# Patient Record
Sex: Female | Born: 1990 | Race: White | Hispanic: No | Marital: Single | Smoking: Former smoker
Health system: Southern US, Community
[De-identification: ages and names within clinical notes are randomized; demographics above are authoritative.]

## PROBLEM LIST (undated history)

## (undated) ENCOUNTER — Inpatient Hospital Stay (HOSPITAL_COMMUNITY): Payer: Self-pay

## (undated) DIAGNOSIS — F32A Depression, unspecified: Secondary | ICD-10-CM

## (undated) DIAGNOSIS — N1330 Unspecified hydronephrosis: Secondary | ICD-10-CM

## (undated) DIAGNOSIS — R12 Heartburn: Secondary | ICD-10-CM

## (undated) DIAGNOSIS — F419 Anxiety disorder, unspecified: Secondary | ICD-10-CM

## (undated) DIAGNOSIS — F4310 Post-traumatic stress disorder, unspecified: Secondary | ICD-10-CM

## (undated) DIAGNOSIS — F329 Major depressive disorder, single episode, unspecified: Secondary | ICD-10-CM

## (undated) DIAGNOSIS — Z9289 Personal history of other medical treatment: Secondary | ICD-10-CM

## (undated) DIAGNOSIS — B999 Unspecified infectious disease: Secondary | ICD-10-CM

## (undated) DIAGNOSIS — F0781 Postconcussional syndrome: Secondary | ICD-10-CM

## (undated) DIAGNOSIS — M1990 Unspecified osteoarthritis, unspecified site: Secondary | ICD-10-CM

## (undated) DIAGNOSIS — F429 Obsessive-compulsive disorder, unspecified: Secondary | ICD-10-CM

## (undated) DIAGNOSIS — M797 Fibromyalgia: Secondary | ICD-10-CM

## (undated) DIAGNOSIS — M2550 Pain in unspecified joint: Secondary | ICD-10-CM

## (undated) DIAGNOSIS — Z9889 Other specified postprocedural states: Secondary | ICD-10-CM

## (undated) DIAGNOSIS — R000 Tachycardia, unspecified: Secondary | ICD-10-CM

## (undated) DIAGNOSIS — D649 Anemia, unspecified: Secondary | ICD-10-CM

## (undated) DIAGNOSIS — R112 Nausea with vomiting, unspecified: Secondary | ICD-10-CM

## (undated) DIAGNOSIS — G8929 Other chronic pain: Secondary | ICD-10-CM

## (undated) DIAGNOSIS — S82899A Other fracture of unspecified lower leg, initial encounter for closed fracture: Secondary | ICD-10-CM

## (undated) DIAGNOSIS — R51 Headache: Secondary | ICD-10-CM

## (undated) HISTORY — PX: TUBAL LIGATION: SHX77

## (undated) HISTORY — PX: OTHER SURGICAL HISTORY: SHX169

## (undated) HISTORY — DX: Postconcussional syndrome: F07.81

## (undated) HISTORY — DX: Fibromyalgia: M79.7

## (undated) HISTORY — DX: Pain in unspecified joint: M25.50

## (undated) HISTORY — DX: Tachycardia, unspecified: R00.0

---
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
## 2009-04-23 ENCOUNTER — Emergency Department (HOSPITAL_COMMUNITY): Admission: EM | Admit: 2009-04-23 | Discharge: 2009-04-23 | Payer: Self-pay | Admitting: Family Medicine

## 2009-06-19 ENCOUNTER — Emergency Department (HOSPITAL_BASED_OUTPATIENT_CLINIC_OR_DEPARTMENT_OTHER): Admission: EM | Admit: 2009-06-19 | Discharge: 2009-06-20 | Payer: Self-pay | Admitting: Emergency Medicine

## 2009-06-20 ENCOUNTER — Ambulatory Visit: Payer: Self-pay | Admitting: Diagnostic Radiology

## 2009-09-02 ENCOUNTER — Encounter: Admission: RE | Admit: 2009-09-02 | Discharge: 2009-09-02 | Payer: Self-pay | Admitting: Family Medicine

## 2009-10-31 ENCOUNTER — Ambulatory Visit: Payer: Self-pay | Admitting: Diagnostic Radiology

## 2009-10-31 ENCOUNTER — Emergency Department (HOSPITAL_BASED_OUTPATIENT_CLINIC_OR_DEPARTMENT_OTHER): Admission: EM | Admit: 2009-10-31 | Discharge: 2009-10-31 | Payer: Self-pay | Admitting: Emergency Medicine

## 2010-03-11 ENCOUNTER — Emergency Department (HOSPITAL_BASED_OUTPATIENT_CLINIC_OR_DEPARTMENT_OTHER): Admission: EM | Admit: 2010-03-11 | Discharge: 2010-03-12 | Payer: Self-pay | Admitting: Emergency Medicine

## 2010-03-11 ENCOUNTER — Ambulatory Visit: Payer: Self-pay | Admitting: Diagnostic Radiology

## 2010-03-12 ENCOUNTER — Ambulatory Visit: Payer: Self-pay | Admitting: Diagnostic Radiology

## 2010-05-08 ENCOUNTER — Encounter: Payer: Self-pay | Admitting: Family Medicine

## 2010-06-16 ENCOUNTER — Emergency Department (HOSPITAL_BASED_OUTPATIENT_CLINIC_OR_DEPARTMENT_OTHER)
Admission: EM | Admit: 2010-06-16 | Discharge: 2010-06-17 | Disposition: A | Discharge status: Home / Self Care | Payer: Medicaid Other | Attending: Emergency Medicine | Admitting: Emergency Medicine

## 2010-06-16 DIAGNOSIS — J329 Chronic sinusitis, unspecified: Secondary | ICD-10-CM | POA: Insufficient documentation

## 2010-06-16 DIAGNOSIS — F319 Bipolar disorder, unspecified: Secondary | ICD-10-CM | POA: Insufficient documentation

## 2010-06-16 DIAGNOSIS — F17200 Nicotine dependence, unspecified, uncomplicated: Secondary | ICD-10-CM | POA: Insufficient documentation

## 2010-06-28 LAB — COMPREHENSIVE METABOLIC PANEL
AST: 13 U/L (ref 0–37)
Alkaline Phosphatase: 126 U/L — ABNORMAL HIGH (ref 39–117)
BUN: 15 mg/dL (ref 6–23)
Calcium: 9.3 mg/dL (ref 8.4–10.5)
Glucose, Bld: 94 mg/dL (ref 70–99)
Total Protein: 7.2 g/dL (ref 6.0–8.3)

## 2010-06-28 LAB — CBC
HCT: 34.3 % — ABNORMAL LOW (ref 36.0–46.0)
Hemoglobin: 11.6 g/dL — ABNORMAL LOW (ref 12.0–15.0)
MCH: 25.3 pg — ABNORMAL LOW (ref 26.0–34.0)
MCHC: 34 g/dL (ref 30.0–36.0)
MCV: 74.3 fL — ABNORMAL LOW (ref 78.0–100.0)

## 2010-06-28 LAB — DIFFERENTIAL
Basophils Absolute: 0.1 10*3/uL (ref 0.0–0.1)
Basophils Relative: 1 % (ref 0–1)
Eosinophils Absolute: 0.5 10*3/uL (ref 0.0–0.7)
Monocytes Absolute: 0.7 10*3/uL (ref 0.1–1.0)

## 2010-06-28 LAB — POCT CARDIAC MARKERS
CKMB, poc: <1 ng/mL — ABNORMAL LOW (ref 1.0–8.0)
Myoglobin, poc: 25 ng/mL (ref 12–200)

## 2010-06-28 LAB — D-DIMER, QUANTITATIVE: D-Dimer, Quant: 0.98 ug/mL-FEU — ABNORMAL HIGH (ref 0.00–0.48)

## 2010-07-11 LAB — COMPREHENSIVE METABOLIC PANEL
AST: 18 U/L (ref 0–37)
Alkaline Phosphatase: 145 U/L — ABNORMAL HIGH (ref 39–117)
BUN: 12 mg/dL (ref 6–23)
Calcium: 9.5 mg/dL (ref 8.4–10.5)
GFR calc non Af Amer: >60 mL/min (ref >60)
Sodium: 144 mEq/L (ref 135–145)
Total Bilirubin: 0.3 mg/dL (ref 0.3–1.2)
Total Protein: 7.7 g/dL (ref 6.0–8.3)

## 2010-07-11 LAB — DIFFERENTIAL
Eosinophils Relative: 4 % (ref 0–5)
Lymphocytes Relative: 23 % (ref 12–46)
Monocytes Absolute: 0.5 10*3/uL (ref 0.1–1.0)
Monocytes Relative: 3 % (ref 3–12)
Neutrophils Relative %: 70 % (ref 43–77)

## 2010-07-11 LAB — WET PREP, GENITAL: Yeast Wet Prep HPF POC: NONE SEEN

## 2010-07-11 LAB — URINE CULTURE: Colony Count: NO GROWTH

## 2010-07-11 LAB — CBC
HCT: 37.6 % (ref 36.0–46.0)
Hemoglobin: 12.6 g/dL (ref 12.0–15.0)
RBC: 5.01 MIL/uL (ref 3.87–5.11)

## 2010-07-11 LAB — URINALYSIS, ROUTINE W REFLEX MICROSCOPIC
Bilirubin Urine: NEGATIVE
Glucose, UA: NEGATIVE mg/dL
Hgb urine dipstick: NEGATIVE
Specific Gravity, Urine: 1.023 (ref 1.005–1.030)
pH: 6 (ref 5.0–8.0)

## 2010-07-14 ENCOUNTER — Emergency Department (INDEPENDENT_AMBULATORY_CARE_PROVIDER_SITE_OTHER): Payer: Medicaid Other

## 2010-07-14 ENCOUNTER — Emergency Department (HOSPITAL_BASED_OUTPATIENT_CLINIC_OR_DEPARTMENT_OTHER)
Admission: EM | Admit: 2010-07-14 | Discharge: 2010-07-14 | Disposition: A | Discharge status: Home / Self Care | Payer: Medicaid Other | Attending: Emergency Medicine | Admitting: Emergency Medicine

## 2010-07-14 DIAGNOSIS — F17200 Nicotine dependence, unspecified, uncomplicated: Secondary | ICD-10-CM

## 2010-07-14 DIAGNOSIS — F411 Generalized anxiety disorder: Secondary | ICD-10-CM | POA: Insufficient documentation

## 2010-07-14 DIAGNOSIS — R079 Chest pain, unspecified: Secondary | ICD-10-CM

## 2010-07-14 DIAGNOSIS — R209 Unspecified disturbances of skin sensation: Secondary | ICD-10-CM

## 2010-07-14 DIAGNOSIS — F319 Bipolar disorder, unspecified: Secondary | ICD-10-CM | POA: Insufficient documentation

## 2010-07-14 DIAGNOSIS — R0602 Shortness of breath: Secondary | ICD-10-CM | POA: Insufficient documentation

## 2010-11-10 ENCOUNTER — Emergency Department (HOSPITAL_BASED_OUTPATIENT_CLINIC_OR_DEPARTMENT_OTHER)
Admission: EM | Admit: 2010-11-10 | Discharge: 2010-11-10 | Disposition: A | Discharge status: Home / Self Care | Payer: Medicaid Other | Attending: Emergency Medicine | Admitting: Emergency Medicine

## 2010-11-10 ENCOUNTER — Emergency Department (INDEPENDENT_AMBULATORY_CARE_PROVIDER_SITE_OTHER): Payer: Medicaid Other

## 2010-11-10 ENCOUNTER — Encounter: Payer: Self-pay | Admitting: *Deleted

## 2010-11-10 DIAGNOSIS — F411 Generalized anxiety disorder: Secondary | ICD-10-CM | POA: Insufficient documentation

## 2010-11-10 DIAGNOSIS — F319 Bipolar disorder, unspecified: Secondary | ICD-10-CM | POA: Insufficient documentation

## 2010-11-10 DIAGNOSIS — F17200 Nicotine dependence, unspecified, uncomplicated: Secondary | ICD-10-CM | POA: Insufficient documentation

## 2010-11-10 DIAGNOSIS — Y92009 Unspecified place in unspecified non-institutional (private) residence as the place of occurrence of the external cause: Secondary | ICD-10-CM | POA: Insufficient documentation

## 2010-11-10 DIAGNOSIS — S93409A Sprain of unspecified ligament of unspecified ankle, initial encounter: Secondary | ICD-10-CM

## 2010-11-10 DIAGNOSIS — M25579 Pain in unspecified ankle and joints of unspecified foot: Secondary | ICD-10-CM

## 2010-11-10 DIAGNOSIS — X500XXA Overexertion from strenuous movement or load, initial encounter: Secondary | ICD-10-CM | POA: Insufficient documentation

## 2010-11-10 HISTORY — DX: Anxiety disorder, unspecified: F41.9

## 2010-11-10 MED ORDER — OXYCODONE-ACETAMINOPHEN 5-325 MG PO TABS
1.0000 | ORAL_TABLET | Freq: Once | ORAL | Status: AC
  Administered 2010-11-10: 1 via ORAL
  Filled 2010-11-10: qty 1

## 2010-11-10 MED ORDER — OXYCODONE-ACETAMINOPHEN 5-325 MG PO TABS: 2.0000 | ORAL_TABLET | ORAL | Status: AC | PRN

## 2010-11-10 MED ORDER — IBUPROFEN 600 MG PO TABS: 600.0000 mg | ORAL_TABLET | Freq: Four times a day (QID) | ORAL | Status: AC | PRN

## 2010-11-10 MED ORDER — IBUPROFEN 400 MG PO TABS
600.0000 mg | ORAL_TABLET | Freq: Once | ORAL | Status: AC
  Administered 2010-11-10: 600 mg via ORAL
  Filled 2010-11-10: qty 1

## 2010-11-10 NOTE — ED Notes (Signed)
Pt reports she fell last night and has pain in right ankle. Mild swelling noted. Pain with weight bearing. Able to move toes and ankle with assistance.

## 2010-11-10 NOTE — ED Provider Notes (Signed)
History     Chief Complaint  Patient presents with  . Ankle Pain   HPI Comments: 20yoF previously healthy pw Rt ankle pain. Per patient last night she suffered eversion injury after missing step down onto the patio.  She did not fall or suffer any other trauma. Unable to fully ambulate independently, has been hopping or using crutches and icing occasionally. Denies numbness/tingling/weakness extremities.   Past Medical History  Diagnosis Date  . Anxiety   . Bipolar 1 disorder    Pshx: denies  Fmhx: [place]  History  Substance Use Topics  . Smoking status: Current Everyday Smoker -- 0.5 packs/day  . Smokeless tobacco: Not on file  . Alcohol Use: Yes    OB History    Grav Para Term Preterm Abortions TAB SAB Ect Mult Living                  Review of Systems  Constitutional: Negative.  Negative for fever and fatigue.  HENT: Negative.   Eyes: Negative.   Respiratory: Negative.  Negative for chest tightness and shortness of breath.   Cardiovascular: Negative.  Negative for chest pain and leg swelling.  Gastrointestinal: Positive for vomiting. Negative for nausea, abdominal pain and blood in stool.  Genitourinary: Negative.   Musculoskeletal: Positive for joint swelling. Negative for arthralgias.  Neurological: Negative.  Negative for dizziness and light-headedness.  Hematological: Negative.   Psychiatric/Behavioral: Negative.   All other systems reviewed and are negative.    Physical Exam  BP 137/81  Pulse 96  Temp(Src) 98.2 F (36.8 C) (Oral)  Resp 20  SpO2 96%  Physical Exam  Nursing note and vitals reviewed. Constitutional: She is oriented to person, place, and time. She appears well-developed.  HENT:  Head: Atraumatic.  Mouth/Throat: Oropharynx is clear and moist.  Eyes: Conjunctivae and EOM are normal. Pupils are equal, round, and reactive to light.  Neck: Normal range of motion. Neck supple.  Cardiovascular: Normal rate, regular rhythm, normal heart sounds  and intact distal pulses.   Pulmonary/Chest: Effort normal and breath sounds normal. No respiratory distress. She has no wheezes. She has no rales.  Abdominal: Soft. She exhibits no distension. There is no tenderness. There is no rebound and no guarding.  Musculoskeletal: Normal range of motion. She exhibits edema and tenderness.       R ankle min swelling lateral aspect, no ecchymosis. Min ttp lateral malleolus, dec ROM dorsiflexion/plantarflexion 2/2 pain. Distal pulses intact. Able to wiggle all toes  Achilles intact b/l  Neurological: She is alert and oriented to person, place, and time.  Skin: Skin is warm and dry. No rash noted.  Psychiatric: She has a normal mood and affect.        DG Ankle Complete Right (Final result)   Result time:11/10/10 1345    Final result by Rad Results In Interface (11/10/10 13:45:48)    Narrative:   *RADIOLOGY REPORT*  Clinical Data: Pain after fall  RIGHT ANKLE - COMPLETE 3+ VIEW  Comparison: None.  Findings: There is no bone, joint, or soft tissue abnormality.  IMPRESSION: Negative right ankle.  Original Report Authenticated By: Brandon Melnick, M.D.        ED Course  Procedures  A/P: 34 yoF with R ankle pain and swelling after eversion injury. XR reviewed by self and radiology-- no fracture/dislocation. ASO to ankle and crutches. Supportive care including analgesia. Wants to f/u with her ortho although she is aware that this is likely sprain/ligamentous injury.  Stefano Gaul, MD       Forbes Cellar, MD 11/10/10 [phone]

## 2011-02-02 LAB — CULTURE, BLOOD (ROUTINE X 2): Culture: NO GROWTH

## 2011-02-02 LAB — CBC
MCV: 80.3 — ABNORMAL LOW
Platelets: 238
RDW: 12.6
WBC: 10.6 — ABNORMAL HIGH

## 2011-02-02 LAB — DIFFERENTIAL
Basophils Absolute: 0
Basophils Relative: 0
Eosinophils Absolute: 0.1
Eosinophils Relative: 1
Lymphs Abs: 1.4
Neutrophils Relative %: 78 — ABNORMAL HIGH

## 2011-02-02 LAB — BASIC METABOLIC PANEL
BUN: 6
Chloride: 106
Creatinine, Ser: 0.53
Glucose, Bld: 98

## 2011-04-23 ENCOUNTER — Encounter (HOSPITAL_BASED_OUTPATIENT_CLINIC_OR_DEPARTMENT_OTHER): Payer: Self-pay | Admitting: *Deleted

## 2011-04-23 ENCOUNTER — Emergency Department (HOSPITAL_BASED_OUTPATIENT_CLINIC_OR_DEPARTMENT_OTHER)
Admission: EM | Admit: 2011-04-23 | Discharge: 2011-04-23 | Disposition: A | Discharge status: Home / Self Care | Payer: Self-pay | Attending: Emergency Medicine | Admitting: Emergency Medicine

## 2011-04-23 DIAGNOSIS — L42 Pityriasis rosea: Secondary | ICD-10-CM | POA: Insufficient documentation

## 2011-04-23 DIAGNOSIS — L0291 Cutaneous abscess, unspecified: Secondary | ICD-10-CM

## 2011-04-23 DIAGNOSIS — F319 Bipolar disorder, unspecified: Secondary | ICD-10-CM | POA: Insufficient documentation

## 2011-04-23 DIAGNOSIS — F411 Generalized anxiety disorder: Secondary | ICD-10-CM | POA: Insufficient documentation

## 2011-04-23 MED ORDER — HYDROCODONE-ACETAMINOPHEN 5-500 MG PO TABS: 1.0000 | ORAL_TABLET | Freq: Four times a day (QID) | ORAL | Status: AC | PRN

## 2011-04-23 MED ORDER — DOXYCYCLINE HYCLATE 100 MG PO CAPS: 100.0000 mg | ORAL_CAPSULE | Freq: Two times a day (BID) | ORAL | Status: AC

## 2011-04-23 NOTE — ED Notes (Signed)
Pt has circular rash under her breasts (over a week) and a ? Red bump in her left groin area since Tuesday.

## 2011-04-23 NOTE — ED Provider Notes (Signed)
History     CSN: [id number]  Arrival date & time 04/23/11  5621   First MD Initiated Contact with Patient 04/23/11 2001      Chief Complaint  Patient presents with  . Rash    (Consider location/radiation/quality/duration/timing/severity/associated sxs/prior treatment) HPI Comments: Pt state that she started with a rash a round area on her back about a month ago and then she developed a whole bunch of red spot around her breast:pt states that she in the last 5 days has developed a red bump and swelling to her left groin  Patient is a 21 y.o. female presenting with rash. The history is provided by the patient. No language interpreter was used.  Rash  This is a new problem. The current episode started more than 1 week ago. The problem has not changed since onset.The problem is associated with an unknown factor. There has been no fever. The rash is present on the back and torso. The pain is moderate. The pain has been constant since onset. Associated symptoms include itching.    Past Medical History  Diagnosis Date  . Anxiety   . Bipolar 1 disorder     History reviewed. No pertinent past surgical history.  History reviewed. No pertinent family history.  History  Substance Use Topics  . Smoking status: Current Everyday Smoker -- 0.5 packs/day  . Smokeless tobacco: Not on file  . Alcohol Use: Yes    OB History    Grav Para Term Preterm Abortions TAB SAB Ect Mult Living                  Review of Systems  Skin: Positive for itching and rash.  All other systems reviewed and are negative.    Allergies  Shellfish allergy; Augmentin; and Codeine  Home Medications   Current Outpatient Rx  Name Route Sig Dispense Refill  . PRENATAL 27-0.8 MG PO TABS Oral Take 1 tablet by mouth daily.      Marland Kitchen DOXYCYCLINE HYCLATE 100 MG PO CAPS Oral Take 1 capsule (100 mg total) by mouth 2 (two) times daily. 14 capsule 0  . HYDROCODONE-ACETAMINOPHEN 5-500 MG PO TABS Oral Take 1-2 tablets by  mouth every 6 (six) hours as needed for pain. 6 tablet 0    BP 140/83  Pulse 78  Temp(Src) 98.6 F (37 C) (Oral)  Resp 20  Ht 5\' 2"  (1.575 m)  Wt 170 lb (77.111 kg)  BMI 31.09 kg/m2  SpO2 100%  LMP 04/11/2011  Physical Exam  Nursing note and vitals reviewed. Constitutional: She is oriented to person, place, and time. She appears well-developed and well-nourished.  HENT:  Head: Normocephalic and atraumatic.  Cardiovascular: Normal rate and regular rhythm.   Pulmonary/Chest: Effort normal and breath sounds normal.  Neurological: She is alert and oriented to person, place, and time.  Skin:       Pt has a large round red area to the right shoulder:pt has multiple red patches on the trunk:pt has a red raised bump to the left groin  Psychiatric: She has a normal mood and affect.    ED Course  Procedures (including critical care time)  Labs Reviewed - No data to display No results found.   1. Pityriasis rosea   2. Abscess       MDM  Rash consistent with pityriasis:abscess early no sign infection        Teressa Lower, NP 04/23/11 2033

## 2011-04-28 NOTE — ED Provider Notes (Signed)
Evaluation and management procedures were performed by the PA/NP under my supervision/collaboration.    Solomia Harrell D Hannalee Castor, MD 04/28/11 1712 

## 2011-05-27 ENCOUNTER — Other Ambulatory Visit: Payer: Self-pay

## 2011-05-27 ENCOUNTER — Emergency Department (INDEPENDENT_AMBULATORY_CARE_PROVIDER_SITE_OTHER): Payer: Self-pay

## 2011-05-27 ENCOUNTER — Encounter (HOSPITAL_BASED_OUTPATIENT_CLINIC_OR_DEPARTMENT_OTHER): Payer: Self-pay | Admitting: Emergency Medicine

## 2011-05-27 ENCOUNTER — Emergency Department (HOSPITAL_BASED_OUTPATIENT_CLINIC_OR_DEPARTMENT_OTHER)
Admission: EM | Admit: 2011-05-27 | Discharge: 2011-05-27 | Disposition: A | Discharge status: Home / Self Care | Payer: Self-pay | Attending: Emergency Medicine | Admitting: Emergency Medicine

## 2011-05-27 DIAGNOSIS — R0789 Other chest pain: Secondary | ICD-10-CM

## 2011-05-27 DIAGNOSIS — F17200 Nicotine dependence, unspecified, uncomplicated: Secondary | ICD-10-CM | POA: Insufficient documentation

## 2011-05-27 DIAGNOSIS — R0602 Shortness of breath: Secondary | ICD-10-CM | POA: Insufficient documentation

## 2011-05-27 DIAGNOSIS — R079 Chest pain, unspecified: Secondary | ICD-10-CM

## 2011-05-27 DIAGNOSIS — R071 Chest pain on breathing: Secondary | ICD-10-CM | POA: Insufficient documentation

## 2011-05-27 DIAGNOSIS — R509 Fever, unspecified: Secondary | ICD-10-CM

## 2011-05-27 DIAGNOSIS — F411 Generalized anxiety disorder: Secondary | ICD-10-CM | POA: Insufficient documentation

## 2011-05-27 DIAGNOSIS — F319 Bipolar disorder, unspecified: Secondary | ICD-10-CM | POA: Insufficient documentation

## 2011-05-27 LAB — URINALYSIS, ROUTINE W REFLEX MICROSCOPIC
Bilirubin Urine: NEGATIVE
Glucose, UA: NEGATIVE mg/dL
Hgb urine dipstick: NEGATIVE
Ketones, ur: NEGATIVE mg/dL
Specific Gravity, Urine: 1.026 (ref 1.005–1.030)
pH: 6 (ref 5.0–8.0)

## 2011-05-27 LAB — URINE MICROSCOPIC-ADD ON

## 2011-05-27 LAB — PREGNANCY, URINE: Preg Test, Ur: NEGATIVE

## 2011-05-27 MED ORDER — IBUPROFEN 800 MG PO TABS: 800.0000 mg | ORAL_TABLET | Freq: Three times a day (TID) | ORAL | Status: AC

## 2011-05-27 MED ORDER — FLUCONAZOLE 150 MG PO TABS: 150.0000 mg | ORAL_TABLET | Freq: Once | ORAL | Status: AC

## 2011-05-27 MED ORDER — ALBUTEROL SULFATE HFA 108 (90 BASE) MCG/ACT IN AERS: 1.0000 | INHALATION_SPRAY | Freq: Four times a day (QID) | RESPIRATORY_TRACT | Status: DC | PRN

## 2011-05-27 NOTE — ED Notes (Signed)
rx x 2 given - family present to drive

## 2011-05-27 NOTE — ED Notes (Addendum)
Pt states she has recently had cold symptoms and that has improved but now has chest tightness with some SOB.  No respiratory distress noted.  Pt relates she had fever last night.  No cough.  Pt states she feels tired.  Pt also relates she has been on antibiotics recently and believes she may have a yeast infection, and is late for her period.

## 2011-05-27 NOTE — ED Provider Notes (Signed)
Medical screening examination/treatment/procedure(s) were performed by non-physician practitioner and as supervising physician I was immediately available for consultation/collaboration.   Klarissa Mcilvain, MD 05/27/11 1531 

## 2011-05-27 NOTE — ED Provider Notes (Signed)
History     CSN: [id number]  Arrival date & time 05/27/11  1223   First MD Initiated Contact with Patient 05/27/11 1253      Chief Complaint  Patient presents with  . Shortness of Breath  . Pleurisy    (Consider location/radiation/quality/duration/timing/severity/associated sxs/prior treatment) Patient is a 21 y.o. female presenting with shortness of breath. The history is provided by the patient. No language interpreter was used.  Shortness of Breath  The current episode started today. The problem occurs continuously. The problem has been gradually worsening. The problem is moderate. The symptoms are relieved by nothing. The symptoms are aggravated by nothing. Associated symptoms include cough and shortness of breath. There was no intake of a foreign body. The intake occurred while eating. She has not inhaled smoke recently. She has had no prior hospitalizations. She has had no prior ICU admissions. She has had no prior intubations. Her past medical history is significant for asthma. Urine output has been normal. Recently, medical care has been given by the PCP. Services received include medications given.  Pt complains of pain in her chest and shortness of breath.   Past Medical History  Diagnosis Date  . Anxiety   . Bipolar 1 disorder     History reviewed. No pertinent past surgical history.  History reviewed. No pertinent family history.  History  Substance Use Topics  . Smoking status: Current Everyday Smoker -- 0.5 packs/day    Types: Cigarettes  . Smokeless tobacco: Not on file  . Alcohol Use: Yes     occasionally    OB History    Grav Para Term Preterm Abortions TAB SAB Ect Mult Living                  Review of Systems  Respiratory: Positive for cough and shortness of breath.   All other systems reviewed and are negative.    Allergies  Shellfish allergy; Augmentin; and Codeine  Home Medications   Current Outpatient Rx  Name Route Sig Dispense Refill    . PRENATAL 27-0.8 MG PO TABS Oral Take 1 tablet by mouth daily.        BP 143/89  Pulse 80  Temp(Src) 98.3 F (36.8 C) (Oral)  Resp 16  Ht 5\' 2"  (1.575 m)  Wt 160 lb (72.576 kg)  BMI 29.26 kg/m2  SpO2 100%  LMP 04/24/2011  Physical Exam  Nursing note and vitals reviewed. Constitutional: She appears well-developed and well-nourished.  HENT:  Head: Normocephalic and atraumatic.  Right Ear: External ear normal.  Left Ear: External ear normal.  Nose: Nose normal.  Eyes: Conjunctivae and EOM are normal. Pupils are equal, round, and reactive to light.  Neck: Normal range of motion. Neck supple.  Cardiovascular: Normal rate and normal heart sounds.   Pulmonary/Chest: Effort normal and breath sounds normal.  Abdominal: Soft.  Musculoskeletal: Normal range of motion.  Neurological: She is alert.  Skin: Skin is warm.  Psychiatric: She has a normal mood and affect.    ED Course  Procedures (including critical care time)  Labs Reviewed  URINALYSIS, ROUTINE W REFLEX MICROSCOPIC - Abnormal; Notable for the following:    Leukocytes, UA SMALL (*)    All other components within normal limits  URINE MICROSCOPIC-ADD ON - Abnormal; Notable for the following:    Squamous Epithelial / LPF FEW (*)    Bacteria, UA MANY (*)    All other components within normal limits  PREGNANCY, URINE  URINALYSIS, DIPSTICK ONLY  Dg Chest 2 View  05/27/2011  *RADIOLOGY REPORT*  Clinical Data:  Shortness of breath, chest pain and fever.  CHEST - 2 VIEW  Comparison: 07/14/2010  Findings: The heart size and mediastinal contours are within normal limits.  Both lungs are clear.  The visualized skeletal structures are unremarkable.  IMPRESSION: No active disease.  Original Report Authenticated By: Reola Calkins, M.D.     No diagnosis found.    MDM   Date: 05/27/2011  Rate: 83  Rhythm: normal sinus rhythm  QRS Axis: normal  Intervals: normal  ST/T Wave abnormalities: normal  Conduction  Disutrbances:none  Narrative Interpretation:   Old EKG Reviewed: none available     Results for orders placed during the hospital encounter of 05/27/11  PREGNANCY, URINE      Component Value Range   Preg Test, Ur NEGATIVE  NEGATIVE   URINALYSIS, ROUTINE W REFLEX MICROSCOPIC      Component Value Range   Color, Urine YELLOW  YELLOW    APPearance CLEAR  CLEAR    Specific Gravity, Urine 1.026  1.005 - 1.030    pH 6.0  5.0 - 8.0    Glucose, UA NEGATIVE  NEGATIVE (mg/dL)   Hgb urine dipstick NEGATIVE  NEGATIVE    Bilirubin Urine NEGATIVE  NEGATIVE    Ketones, ur NEGATIVE  NEGATIVE (mg/dL)   Protein, ur NEGATIVE  NEGATIVE (mg/dL)   Urobilinogen, UA 0.2  0.0 - 1.0 (mg/dL)   Nitrite NEGATIVE  NEGATIVE    Leukocytes, UA SMALL (*) NEGATIVE   URINE MICROSCOPIC-ADD ON      Component Value Range   Squamous Epithelial / LPF FEW (*) RARE    WBC, UA 3-6  <3 (WBC/hpf)   Bacteria, UA MANY (*) RARE    Urine-Other MUCOUS PRESENT     Dg Chest 2 View  05/27/2011  *RADIOLOGY REPORT*  Clinical Data:  Shortness of breath, chest pain and fever.  CHEST - 2 VIEW  Comparison: 07/14/2010  Findings: The heart size and mediastinal contours are within normal limits.  Both lungs are clear.  The visualized skeletal structures are unremarkable.  IMPRESSION: No active disease.  Original Report Authenticated By: Reola Calkins, M.D.    Pt does admit increased stress.  I will treat with ibuprofen and albuterol.     Langston Masker, [place] 05/27/11 1453

## 2011-09-08 ENCOUNTER — Encounter (HOSPITAL_BASED_OUTPATIENT_CLINIC_OR_DEPARTMENT_OTHER): Payer: Self-pay | Admitting: *Deleted

## 2011-09-08 ENCOUNTER — Emergency Department (HOSPITAL_BASED_OUTPATIENT_CLINIC_OR_DEPARTMENT_OTHER)
Admission: EM | Admit: 2011-09-08 | Discharge: 2011-09-08 | Disposition: A | Discharge status: Home / Self Care | Payer: Self-pay | Attending: Emergency Medicine | Admitting: Emergency Medicine

## 2011-09-08 ENCOUNTER — Emergency Department (HOSPITAL_BASED_OUTPATIENT_CLINIC_OR_DEPARTMENT_OTHER): Payer: Self-pay

## 2011-09-08 DIAGNOSIS — A499 Bacterial infection, unspecified: Secondary | ICD-10-CM | POA: Insufficient documentation

## 2011-09-08 DIAGNOSIS — F319 Bipolar disorder, unspecified: Secondary | ICD-10-CM | POA: Insufficient documentation

## 2011-09-08 DIAGNOSIS — Z79899 Other long term (current) drug therapy: Secondary | ICD-10-CM | POA: Insufficient documentation

## 2011-09-08 DIAGNOSIS — N739 Female pelvic inflammatory disease, unspecified: Secondary | ICD-10-CM | POA: Insufficient documentation

## 2011-09-08 DIAGNOSIS — B9689 Other specified bacterial agents as the cause of diseases classified elsewhere: Secondary | ICD-10-CM | POA: Insufficient documentation

## 2011-09-08 DIAGNOSIS — F17200 Nicotine dependence, unspecified, uncomplicated: Secondary | ICD-10-CM | POA: Insufficient documentation

## 2011-09-08 DIAGNOSIS — R112 Nausea with vomiting, unspecified: Secondary | ICD-10-CM | POA: Insufficient documentation

## 2011-09-08 DIAGNOSIS — F411 Generalized anxiety disorder: Secondary | ICD-10-CM | POA: Insufficient documentation

## 2011-09-08 DIAGNOSIS — N730 Acute parametritis and pelvic cellulitis: Secondary | ICD-10-CM

## 2011-09-08 DIAGNOSIS — F4310 Post-traumatic stress disorder, unspecified: Secondary | ICD-10-CM | POA: Insufficient documentation

## 2011-09-08 DIAGNOSIS — N760 Acute vaginitis: Secondary | ICD-10-CM | POA: Insufficient documentation

## 2011-09-08 HISTORY — DX: Post-traumatic stress disorder, unspecified: F43.10

## 2011-09-08 LAB — COMPREHENSIVE METABOLIC PANEL
ALT: 20 U/L (ref 0–35)
Albumin: 4.1 g/dL (ref 3.5–5.2)
Alkaline Phosphatase: 111 U/L (ref 39–117)
BUN: 13 mg/dL (ref 6–23)
Chloride: 103 mEq/L (ref 96–112)
Glucose, Bld: 91 mg/dL (ref 70–99)
Potassium: 4.5 mEq/L (ref 3.5–5.1)
Sodium: 139 mEq/L (ref 135–145)
Total Bilirubin: 0.1 mg/dL — ABNORMAL LOW (ref 0.3–1.2)
Total Protein: 7.3 g/dL (ref 6.0–8.3)

## 2011-09-08 LAB — URINALYSIS, ROUTINE W REFLEX MICROSCOPIC
Bilirubin Urine: NEGATIVE
Glucose, UA: NEGATIVE mg/dL
Hgb urine dipstick: NEGATIVE
Specific Gravity, Urine: 1.008 (ref 1.005–1.030)

## 2011-09-08 LAB — CBC
Hemoglobin: 13.4 g/dL (ref 12.0–15.0)
MCH: 26.9 pg (ref 26.0–34.0)
Platelets: 313 10*3/uL (ref 150–400)
RBC: 4.99 MIL/uL (ref 3.87–5.11)

## 2011-09-08 LAB — DIFFERENTIAL
Basophils Relative: 0 % (ref 0–1)
Eosinophils Absolute: 0.5 10*3/uL (ref 0.0–0.7)
Lymphs Abs: 5.7 10*3/uL — ABNORMAL HIGH (ref 0.7–4.0)
Monocytes Relative: 7 % (ref 3–12)
Neutro Abs: 5.6 10*3/uL (ref 1.7–7.7)
Neutrophils Relative %: 44 % (ref 43–77)

## 2011-09-08 LAB — GC/CHLAMYDIA PROBE AMP, GENITAL: Chlamydia, DNA Probe: NEGATIVE

## 2011-09-08 MED ORDER — AZITHROMYCIN 1 G PO PACK
1.0000 g | PACK | Freq: Once | ORAL | Status: AC
  Administered 2011-09-08: 1 g via ORAL
  Filled 2011-09-08: qty 1

## 2011-09-08 MED ORDER — CEFTRIAXONE SODIUM 250 MG IJ SOLR
250.0000 mg | Freq: Once | INTRAMUSCULAR | Status: AC
  Administered 2011-09-08: 250 mg via INTRAMUSCULAR
  Filled 2011-09-08: qty 250

## 2011-09-08 MED ORDER — SODIUM CHLORIDE 0.9 % IV BOLUS (SEPSIS)
1000.0000 mL | Freq: Once | INTRAVENOUS | Status: AC
  Administered 2011-09-08: 1000 mL via INTRAVENOUS

## 2011-09-08 MED ORDER — IOHEXOL 300 MG/ML  SOLN: 20.0000 mL | INTRAMUSCULAR | Status: AC

## 2011-09-08 MED ORDER — FENTANYL CITRATE 0.05 MG/ML IJ SOLN
50.0000 ug | Freq: Once | INTRAMUSCULAR | Status: AC
  Administered 2011-09-08: 05:00:00 via INTRAVENOUS
  Filled 2011-09-08: qty 2

## 2011-09-08 MED ORDER — FENTANYL CITRATE 0.05 MG/ML IJ SOLN
50.0000 ug | Freq: Once | INTRAMUSCULAR | Status: AC
  Administered 2011-09-08: 50 ug via INTRAVENOUS
  Filled 2011-09-08: qty 2

## 2011-09-08 MED ORDER — IOHEXOL 300 MG/ML  SOLN: 100.0000 mL | Freq: Once | INTRAMUSCULAR | Status: DC | PRN

## 2011-09-08 MED ORDER — ONDANSETRON HCL 4 MG/2ML IJ SOLN
4.0000 mg | Freq: Once | INTRAMUSCULAR | Status: AC
  Administered 2011-09-08: 4 mg via INTRAVENOUS
  Filled 2011-09-08: qty 2

## 2011-09-08 MED ORDER — DOXYCYCLINE HYCLATE 100 MG PO CAPS: 100.0000 mg | ORAL_CAPSULE | Freq: Two times a day (BID) | ORAL | Status: AC

## 2011-09-08 MED ORDER — OXYCODONE-ACETAMINOPHEN 5-325 MG PO TABS: 1.0000 | ORAL_TABLET | Freq: Four times a day (QID) | ORAL | Status: AC | PRN

## 2011-09-08 MED ORDER — METRONIDAZOLE 500 MG PO TABS: 500.0000 mg | ORAL_TABLET | Freq: Two times a day (BID) | ORAL | Status: AC

## 2011-09-08 MED ORDER — LIDOCAINE HCL (PF) 1 % IJ SOLN
INTRAMUSCULAR | Status: AC
  Filled 2011-09-08: qty 5

## 2011-09-08 NOTE — ED Provider Notes (Signed)
History     CSN: [id number]  Arrival date & time 09/08/11  0308   First MD Initiated Contact with Patient 09/08/11 0411      Chief Complaint  Patient presents with  . Abdominal Pain  . Nausea    (Consider location/radiation/quality/duration/timing/severity/associated sxs/prior treatment) Patient is a 21 y.o. female presenting with abdominal pain and vaginal discharge.  Abdominal Pain The primary symptoms of the illness include abdominal pain, nausea, vomiting and vaginal discharge. The current episode started more than 2 days ago. The onset of the illness was gradual. The problem has not changed since onset. The abdominal pain is generalized. The abdominal pain does not radiate. The severity of the abdominal pain is 10/10. The abdominal pain is relieved by nothing. Exacerbated by: nothing.  Associated with: nothing. The patient states that she believes she is currently not pregnant. The patient has not had a change in bowel habit. Risk factors: none. Symptoms associated with the illness do not include frequency. Significant associated medical issues do not include diabetes.  Vaginal Discharge This is a new problem. The current episode started more than 1 week ago (more than a month ago). The problem occurs constantly. The problem has not changed since onset.Associated symptoms include abdominal pain. The symptoms are aggravated by nothing. The symptoms are relieved by nothing. She has tried nothing for the symptoms. The treatment provided no relief.    Past Medical History  Diagnosis Date  . Anxiety   . Bipolar 1 disorder   . Post traumatic stress disorder (PTSD)     History reviewed. No pertinent past surgical history.  History reviewed. No pertinent family history.  History  Substance Use Topics  . Smoking status: Current Everyday Smoker -- 0.5 packs/day    Types: Cigarettes  . Smokeless tobacco: Not on file  . Alcohol Use: Yes     occasionally    OB History    Grav  Para Term Preterm Abortions TAB SAB Ect Mult Living                  Review of Systems  Gastrointestinal: Positive for nausea, vomiting and abdominal pain.  Genitourinary: Positive for vaginal discharge. Negative for frequency.  All other systems reviewed and are negative.    Allergies  Shellfish allergy; Amoxicillin-pot clavulanate; and Codeine  Home Medications   Current Outpatient Rx  Name Route Sig Dispense Refill  . ALBUTEROL SULFATE HFA 108 (90 BASE) MCG/ACT IN AERS Inhalation Inhale 1-2 puffs into the lungs every 6 (six) hours as needed for wheezing. 1 Inhaler 0  . DOXYCYCLINE HYCLATE 100 MG PO CAPS Oral Take 1 capsule (100 mg total) by mouth 2 (two) times daily. One po bid x 14 days 28 capsule 0  . METRONIDAZOLE 500 MG PO TABS Oral Take 1 tablet (500 mg total) by mouth 2 (two) times daily. One po bid x 7 days 14 tablet 0  . OXYCODONE-ACETAMINOPHEN 5-325 MG PO TABS Oral Take 1 tablet by mouth every 6 (six) hours as needed for pain. 11 tablet 0  . PRENATAL 27-0.8 MG PO TABS Oral Take 1 tablet by mouth daily.        BP 134/84  Pulse 80  Temp(Src) 97.5 F (36.4 C) (Oral)  Resp 20  Ht 5\' 2"  (1.575 m)  Wt 174 lb (78.926 kg)  BMI 31.83 kg/m2  SpO2 100%  LMP 09/01/2011  Physical Exam  Constitutional: She is oriented to person, place, and time. She appears well-developed and well-nourished. No distress.  HENT:  Head: Normocephalic and atraumatic.  Mouth/Throat: Oropharynx is clear and moist.  Eyes: Conjunctivae are normal. Pupils are equal, round, and reactive to light.  Neck: Normal range of motion. Neck supple.  Cardiovascular: Normal rate and regular rhythm.   Pulmonary/Chest: Effort normal and breath sounds normal.  Abdominal: Soft. Bowel sounds are normal. There is no rebound and no guarding.  Genitourinary: Cervix exhibits motion tenderness and discharge. Right adnexum displays tenderness. Left adnexum displays tenderness. Vaginal discharge found.       Chaperone  [place] present.  Patient reaction to amox is nausea  Musculoskeletal: Normal range of motion.  Neurological: She is alert and oriented to person, place, and time.  Skin: Skin is warm and dry.  Psychiatric: She has a normal mood and affect.    ED Course  Procedures (including critical care time)  Labs Reviewed  CBC - Abnormal; Notable for the following:    WBC 12.8 (*)    All other components within normal limits  DIFFERENTIAL - Abnormal; Notable for the following:    Lymphs Abs 5.7 (*)    All other components within normal limits  COMPREHENSIVE METABOLIC PANEL - Abnormal; Notable for the following:    Total Bilirubin 0.1 (*)    All other components within normal limits  WET PREP, GENITAL - Abnormal; Notable for the following:    Clue Cells Wet Prep HPF POC MODERATE (*)    WBC, Wet Prep HPF POC MANY (*)    All other components within normal limits  LIPASE, BLOOD  PREGNANCY, URINE  URINALYSIS, ROUTINE W REFLEX MICROSCOPIC  GC/CHLAMYDIA PROBE AMP, GENITAL   Ct Abdomen Pelvis W Contrast  09/08/2011  *RADIOLOGY REPORT*  Clinical Data: Abdominal pain  CT ABDOMEN AND PELVIS WITH CONTRAST  Technique:  Multidetector CT imaging of the abdomen and pelvis was performed following the standard protocol during bolus administration of intravenous contrast.  Contrast:  100 ml Omnipaque-300  Comparison: 06/20/2009  Findings: Limited images through the lung bases demonstrate no significant appreciable abnormality. The heart size is within normal limits. No pleural or pericardial effusion.  Unremarkable liver, biliary system, spleen, pancreas, adrenal glands.  Symmetric renal enhancement.  No hydronephrosis or hydroureter.  No bowel obstruction.  No CT evidence for colitis.  Normal appendix.  No free intraperitoneal air or fluid.  No lymphadenopathy.  Normal caliber vasculature.  Thin-walled bladder.  Unremarkable CT appearance to the uterus and adnexa.  No acute osseous finding.  IMPRESSION: No acute  abnormality identified by CT.  Original Report Authenticated By: Waneta Martins, M.D.     1. PID (acute pelvic inflammatory disease)   2. BV (bacterial vaginosis)       MDM  Take all antibiotics, no sexual acitivty until 7 days after all partners are treated follow up [place] [facility].  Patient verbalizes understanding and agrees to follow up        Fielding Mault Smitty Cords, MD 09/08/11 1610

## 2011-09-08 NOTE — ED Notes (Signed)
Pt states that she has generalized abd pain pt describes it as a rolling pt also with intermittent nausea that wakes her from her sleep.denies diarrhea or fever

## 2011-12-26 ENCOUNTER — Encounter (HOSPITAL_BASED_OUTPATIENT_CLINIC_OR_DEPARTMENT_OTHER): Payer: Self-pay | Admitting: *Deleted

## 2011-12-26 ENCOUNTER — Emergency Department (HOSPITAL_BASED_OUTPATIENT_CLINIC_OR_DEPARTMENT_OTHER): Payer: Self-pay

## 2011-12-26 ENCOUNTER — Emergency Department (HOSPITAL_BASED_OUTPATIENT_CLINIC_OR_DEPARTMENT_OTHER)
Admission: EM | Admit: 2011-12-26 | Discharge: 2011-12-26 | Disposition: A | Discharge status: Home / Self Care | Payer: Self-pay | Attending: Emergency Medicine | Admitting: Emergency Medicine

## 2011-12-26 DIAGNOSIS — J40 Bronchitis, not specified as acute or chronic: Secondary | ICD-10-CM | POA: Insufficient documentation

## 2011-12-26 DIAGNOSIS — F17200 Nicotine dependence, unspecified, uncomplicated: Secondary | ICD-10-CM | POA: Insufficient documentation

## 2011-12-26 DIAGNOSIS — F319 Bipolar disorder, unspecified: Secondary | ICD-10-CM | POA: Insufficient documentation

## 2011-12-26 DIAGNOSIS — F4310 Post-traumatic stress disorder, unspecified: Secondary | ICD-10-CM | POA: Insufficient documentation

## 2011-12-26 MED ORDER — AZITHROMYCIN 250 MG PO TABS: ORAL_TABLET | ORAL | Status: DC

## 2011-12-26 MED ORDER — IBUPROFEN 800 MG PO TABS
800.0000 mg | ORAL_TABLET | Freq: Once | ORAL | Status: AC
  Administered 2011-12-26: 800 mg via ORAL
  Filled 2011-12-26: qty 1

## 2011-12-26 MED ORDER — HYDROCODONE-ACETAMINOPHEN 5-325 MG PO TABS: 2.0000 | ORAL_TABLET | ORAL | Status: AC | PRN

## 2011-12-26 MED ORDER — HYDROCODONE-ACETAMINOPHEN 5-325 MG PO TABS
2.0000 | ORAL_TABLET | Freq: Once | ORAL | Status: AC
  Administered 2011-12-26: 2 via ORAL
  Filled 2011-12-26: qty 2

## 2011-12-26 MED ORDER — IBUPROFEN 800 MG PO TABS: 800.0000 mg | ORAL_TABLET | Freq: Three times a day (TID) | ORAL | Status: AC

## 2011-12-26 NOTE — ED Provider Notes (Signed)
Medical screening examination/treatment/procedure(s) were performed by non-physician practitioner and as supervising physician I was immediately available for consultation/collaboration.   Dawnita Molner B. Bernette Mayers, MD 12/26/11 2033

## 2011-12-26 NOTE — ED Notes (Signed)
Pt c/o flu-like symptoms x3 days

## 2011-12-26 NOTE — ED Provider Notes (Addendum)
History     CSN: [id number]  Arrival date & time 12/26/11  1807   First MD Initiated Contact with Patient 12/26/11 1838      Chief Complaint  Patient presents with  . Influenza    (Consider location/radiation/quality/duration/timing/severity/associated sxs/prior treatment) Patient is a 21 y.o. female presenting with fever. The history is provided by the patient. No language interpreter was used.  Fever Primary symptoms of the febrile illness include fever and cough. The current episode started 3 to 5 days ago. This is a new problem. The problem has been gradually worsening.  Associated with: cough. Risk factors: none.   Past Medical History  Diagnosis Date  . Anxiety   . Bipolar 1 disorder   . Post traumatic stress disorder (PTSD)     History reviewed. No pertinent past surgical history.  History reviewed. No pertinent family history.  History  Substance Use Topics  . Smoking status: Current Everyday Smoker -- 0.5 packs/day    Types: Cigarettes  . Smokeless tobacco: Not on file  . Alcohol Use: Yes     occasionally    OB History    Grav Para Term Preterm Abortions TAB SAB Ect Mult Living                  Review of Systems  Constitutional: Positive for fever.  Respiratory: Positive for cough.   All other systems reviewed and are negative.    Allergies  Shellfish allergy; Amoxicillin-pot clavulanate; and Codeine  Home Medications   Current Outpatient Rx  Name Route Sig Dispense Refill  . ALKA-SELTZER PLUS COLD PO Oral Take 2 tablets by mouth daily as needed. For cold symptoms    . DM-GUAIFENESIN ER 30-600 MG PO TB12 Oral Take 1 tablet by mouth every 12 (twelve) hours.      BP 142/89  Pulse 102  Temp 99.1 F (37.3 C) (Oral)  Resp 18  Ht 5\' 2"  (1.575 m)  Wt 180 lb (81.647 kg)  BMI 32.92 kg/m2  SpO2 100%  LMP 12/21/2011  Physical Exam  Nursing note and vitals reviewed. Constitutional: She is oriented to person, place, and time. She appears  well-developed and well-nourished.  HENT:  Head: Normocephalic and atraumatic.  Right Ear: External ear normal.  Left Ear: External ear normal.  Nose: Nose normal.  Mouth/Throat: Oropharynx is clear and moist.  Eyes: Conjunctivae and EOM are normal. Pupils are equal, round, and reactive to light.  Neck: Normal range of motion.  Cardiovascular: Normal rate and normal heart sounds.   Pulmonary/Chest: Effort normal and breath sounds normal.  Abdominal: Soft. Bowel sounds are normal.  Musculoskeletal: Normal range of motion.  Neurological: She is alert and oriented to person, place, and time. She has normal reflexes.  Skin: Skin is warm.  Psychiatric: She has a normal mood and affect.    ED Course  Procedures (including critical care time)  Labs Reviewed - No data to display Dg Chest 2 View  12/26/2011  *RADIOLOGY REPORT*  Clinical Data: Cough, fever, chills, myalgias.  Smoker.  CHEST - 2 VIEW  Comparison: 05/27/2011.  Findings: Normal sized heart.  Clear lungs.  Interval central peribronchial thickening.  Unremarkable bones.  IMPRESSION: Interval moderate central bronchitic changes.   Original Report Authenticated By: Darrol Angel, M.D.      1. Bronchitis       MDM  Temp decreased.  Pt given ibuprofen and hydrocodone for cough and pain.  Chest xray shows bronchitic changes.   Pt given rx  for hydrocodone for cough and pain, ibuprofen 800 and zithromax.         sheldon  Lonia Skinner [place], [place] 12/26/11 2031  Lonia Skinner [place], [place] 12/26/11 2034

## 2012-02-15 ENCOUNTER — Encounter (HOSPITAL_BASED_OUTPATIENT_CLINIC_OR_DEPARTMENT_OTHER): Payer: Self-pay | Admitting: *Deleted

## 2012-02-15 ENCOUNTER — Emergency Department (HOSPITAL_BASED_OUTPATIENT_CLINIC_OR_DEPARTMENT_OTHER): Payer: Self-pay

## 2012-02-15 ENCOUNTER — Emergency Department (HOSPITAL_BASED_OUTPATIENT_CLINIC_OR_DEPARTMENT_OTHER)
Admission: EM | Admit: 2012-02-15 | Discharge: 2012-02-15 | Disposition: A | Discharge status: Home / Self Care | Payer: Self-pay | Attending: Emergency Medicine | Admitting: Emergency Medicine

## 2012-02-15 DIAGNOSIS — F17200 Nicotine dependence, unspecified, uncomplicated: Secondary | ICD-10-CM | POA: Insufficient documentation

## 2012-02-15 DIAGNOSIS — R112 Nausea with vomiting, unspecified: Secondary | ICD-10-CM | POA: Insufficient documentation

## 2012-02-15 DIAGNOSIS — N898 Other specified noninflammatory disorders of vagina: Secondary | ICD-10-CM | POA: Insufficient documentation

## 2012-02-15 DIAGNOSIS — R1084 Generalized abdominal pain: Secondary | ICD-10-CM | POA: Insufficient documentation

## 2012-02-15 DIAGNOSIS — Z8659 Personal history of other mental and behavioral disorders: Secondary | ICD-10-CM | POA: Insufficient documentation

## 2012-02-15 DIAGNOSIS — N949 Unspecified condition associated with female genital organs and menstrual cycle: Secondary | ICD-10-CM | POA: Insufficient documentation

## 2012-02-15 DIAGNOSIS — R109 Unspecified abdominal pain: Secondary | ICD-10-CM

## 2012-02-15 DIAGNOSIS — N938 Other specified abnormal uterine and vaginal bleeding: Secondary | ICD-10-CM | POA: Insufficient documentation

## 2012-02-15 LAB — URINALYSIS, ROUTINE W REFLEX MICROSCOPIC
Bilirubin Urine: NEGATIVE
Glucose, UA: NEGATIVE mg/dL
Ketones, ur: NEGATIVE mg/dL
Leukocytes, UA: NEGATIVE
Nitrite: NEGATIVE
Protein, ur: NEGATIVE mg/dL
Specific Gravity, Urine: 1.028 (ref 1.005–1.030)
Urobilinogen, UA: 0.2 mg/dL (ref 0.0–1.0)
pH: 5.5 (ref 5.0–8.0)

## 2012-02-15 LAB — PREGNANCY, URINE: Preg Test, Ur: NEGATIVE

## 2012-02-15 LAB — URINE MICROSCOPIC-ADD ON

## 2012-02-15 LAB — WET PREP, GENITAL

## 2012-02-15 MED ORDER — OXYCODONE-ACETAMINOPHEN 5-325 MG PO TABS: 2.0000 | ORAL_TABLET | ORAL | Status: DC | PRN

## 2012-02-15 MED ORDER — MORPHINE SULFATE 4 MG/ML IJ SOLN
4.0000 mg | Freq: Once | INTRAMUSCULAR | Status: AC
  Administered 2012-02-15: 4 mg via INTRAVENOUS
  Filled 2012-02-15: qty 1

## 2012-02-15 MED ORDER — PROMETHAZINE HCL 25 MG PO TABS: 25.0000 mg | ORAL_TABLET | Freq: Four times a day (QID) | ORAL | Status: DC | PRN

## 2012-02-15 MED ORDER — HYDROMORPHONE HCL PF 1 MG/ML IJ SOLN
1.0000 mg | Freq: Once | INTRAMUSCULAR | Status: AC
  Administered 2012-02-15: 1 mg via INTRAVENOUS
  Filled 2012-02-15: qty 1

## 2012-02-15 MED ORDER — ONDANSETRON HCL 4 MG/2ML IJ SOLN
4.0000 mg | Freq: Once | INTRAMUSCULAR | Status: AC
  Administered 2012-02-15: 4 mg via INTRAVENOUS
  Filled 2012-02-15: qty 2

## 2012-02-15 MED ORDER — SODIUM CHLORIDE 0.9 % IV BOLUS (SEPSIS)
1000.0000 mL | Freq: Once | INTRAVENOUS | Status: AC
  Administered 2012-02-15: 1000 mL via INTRAVENOUS

## 2012-02-15 NOTE — ED Provider Notes (Signed)
History     CSN: [id number]  Arrival date & time 02/15/12  1306   First MD Initiated Contact with Patient 02/15/12 1329      Chief Complaint  Patient presents with  . menstrual cramps/ovary pain     (Consider location/radiation/quality/duration/timing/severity/associated sxs/prior treatment) HPI Comments: Patient is a 21 year old female with a past medical history of anxiety and bipolar disorder who presents with abdominal pain that started this morning upon waking. The pain is located in her generalized lower abdomen and does not radiate. The pain is described as cramping and throbbing and severe. The pain is constant. The pain started gradually and progressively worsened since the onset. No alleviating/aggravating factors. The patient has tried nothing for symptoms without relief. Associated symptoms include nausea and vomiting. Patient denies fever, headache, NVD, chest pain, SOB, dysuria, constipation, abnormal vaginal bleeding/discharge. Patient is currently menstruating, as her menstrual cycle started this morning with her symptoms. Patient reports typical painful periods but never like this before. Patient does not have an OBGYN.      Past Medical History  Diagnosis Date  . Anxiety   . Bipolar 1 disorder   . Post traumatic stress disorder (PTSD)     History reviewed. No pertinent past surgical history.  History reviewed. No pertinent family history.  History  Substance Use Topics  . Smoking status: Current Every Day Smoker -- 0.5 packs/day    Types: Cigarettes  . Smokeless tobacco: Not on file  . Alcohol Use: Yes     occasionally    OB History    Grav Para Term Preterm Abortions TAB SAB Ect Mult Living                  Review of Systems  Gastrointestinal: Positive for nausea, vomiting and abdominal pain.  All other systems reviewed and are negative.    Allergies  Shellfish allergy; Amoxicillin-pot clavulanate; and Codeine  Home Medications   Current  Outpatient Rx  Name Route Sig Dispense Refill  . AZITHROMYCIN 250 MG PO TABS  2 tablets 1st day then 1 a day 6 tablet 0  . ALKA-SELTZER PLUS COLD PO Oral Take 2 tablets by mouth daily as needed. For cold symptoms    . DM-GUAIFENESIN ER 30-600 MG PO TB12 Oral Take 1 tablet by mouth every 12 (twelve) hours.      BP 132/86  Pulse 82  Temp 98.5 F (36.9 C) (Oral)  Resp 20  SpO2 100%  LMP 01/01/2012  Physical Exam  Nursing note and vitals reviewed. Constitutional: She is oriented to person, place, and time. She appears well-developed and well-nourished. No distress.  HENT:  Head: Normocephalic and atraumatic.  Right Ear: External ear normal.  Left Ear: External ear normal.  Eyes: Conjunctivae normal and EOM are normal.  Neck: Normal range of motion. Neck supple.  Cardiovascular: Normal rate and regular rhythm.  Exam reveals no gallop and no friction rub.   No murmur heard. Pulmonary/Chest: Effort normal and breath sounds normal. She has no wheezes. She has no rales. She exhibits no tenderness.  Abdominal: Soft. She exhibits no distension and no mass. There is tenderness. There is no rebound and no guarding.       Generalized lower abdominal tenderness to palpation.   Genitourinary: Vagina normal.       Moderate amount of red blood in vagina. No discharge noted. No cervical motion tenderness. Bimanual exam reveals bilateral adnexal tenderness but no masses.  Musculoskeletal: Normal range of motion.  Neurological:  She is alert and oriented to person, place, and time. Coordination normal.       Speech is goal-oriented. Moves limbs without ataxia.   Skin: Skin is warm and dry. She is not diaphoretic.  Psychiatric: She has a normal mood and affect. Her behavior is normal.    ED Course  Procedures (including critical care time)  Labs Reviewed - No data to display US Transvaginal Non-ob  02/15/2012  *RADIOLOGY REPORT*  Clinical Data: Painful menses.  History of ovarian cyst.  LMP  02/15/2012  TRANSABDOMINAL AND TRANSVAGINAL ULTRASOUND OF PELVIS Technique:  Both transabdominal and transvaginal ultrasound examinations of the pelvis were performed. Transabdominal technique was performed for global imaging of the pelvis including uterus, ovaries, adnexal regions, and pelvic cul-de-sac.  It was necessary to proceed with endovaginal exam following the transabdominal exam to visualize the myometrium, endometrium and adnexa.  Comparison:  None  Findings:  Uterus: Is anteverted and anteflexed and demonstrates a sagittal length of 6.6 cm, depth 3.1 cm and width of 3.8 cm.  A homogeneous myometrium is seen  Endometrium: Appears echogenic with a width of 4 mm.  No areas of focal thickening or heterogeneity are seen and this would correlate with a secretory endometrial stripe and correspond with the patient's given LMP of 02/15/2012  Right ovary:  Measures 2.5 x 1.9 x 2.0 cm and is poorly resolved but demonstrates no obvious focal abnormality  Left ovary: Measures 2.4 x 1.9 by 2.0 cm and is poorly resolved but demonstrates no obvious focal abnormality  Other findings: No pelvic fluid or separate adnexal masses are seen.  IMPRESSION: Unremarkable secretory pelvic ultrasound with overall poor resolution of the ovaries possible but no definite focal abnormality seen.   Original Report Authenticated By: Rhodia Albright, M.D.    US Pelvis Complete  02/15/2012  *RADIOLOGY REPORT*  Clinical Data: Painful menses.  History of ovarian cyst.  LMP 02/15/2012  TRANSABDOMINAL AND TRANSVAGINAL ULTRASOUND OF PELVIS Technique:  Both transabdominal and transvaginal ultrasound examinations of the pelvis were performed. Transabdominal technique was performed for global imaging of the pelvis including uterus, ovaries, adnexal regions, and pelvic cul-de-sac.  It was necessary to proceed with endovaginal exam following the transabdominal exam to visualize the myometrium, endometrium and adnexa.  Comparison:  None  Findings:   Uterus: Is anteverted and anteflexed and demonstrates a sagittal length of 6.6 cm, depth 3.1 cm and width of 3.8 cm.  A homogeneous myometrium is seen  Endometrium: Appears echogenic with a width of 4 mm.  No areas of focal thickening or heterogeneity are seen and this would correlate with a secretory endometrial stripe and correspond with the patient's given LMP of 02/15/2012  Right ovary:  Measures 2.5 x 1.9 x 2.0 cm and is poorly resolved but demonstrates no obvious focal abnormality  Left ovary: Measures 2.4 x 1.9 by 2.0 cm and is poorly resolved but demonstrates no obvious focal abnormality  Other findings: No pelvic fluid or separate adnexal masses are seen.  IMPRESSION: Unremarkable secretory pelvic ultrasound with overall poor resolution of the ovaries possible but no definite focal abnormality seen.   Original Report Authenticated By: Rhodia Albright, M.D.      1. Abdominal pain       MDM  2:13 PM Patient will have fluids, morphine and zofran for pain and nausea. Urinalysis pending. I will do a pelvic exam.   4:27 PM US unremarkable. Patient has gotten some relief with the morphine and zofran. Urinalysis unremarkable for infection. Urine preg negative.  Wet prep shows clue cells but patient asymptomatic. Patient will have follow up with OBGYN. Patient will be discharged with percocet for pain and phenergan for nausea. No further evaluation needed at this time.       Emilia Beck, PA-C 02/19/12 [phone]

## 2012-02-15 NOTE — ED Notes (Signed)
Pain lower abdomen and low back onset this morning with onset of her period pt states she always has severe cramps and pain but this morning is much worse than usual pt also has nausea and states the pain is so bad she did vomit once

## 2012-02-16 LAB — GC/CHLAMYDIA PROBE AMP, GENITAL: Chlamydia, DNA Probe: NEGATIVE

## 2012-02-17 LAB — URINE CULTURE: Special Requests: NORMAL

## 2012-02-20 NOTE — ED Provider Notes (Signed)
Medical screening examination/treatment/procedure(s) were performed by non-physician practitioner and as supervising physician I was immediately available for consultation/collaboration.   Charles B. Sheldon, MD 02/20/12 0658 

## 2012-03-07 ENCOUNTER — Ambulatory Visit (INDEPENDENT_AMBULATORY_CARE_PROVIDER_SITE_OTHER): Payer: Self-pay | Admitting: Obstetrics & Gynecology

## 2012-03-07 ENCOUNTER — Encounter: Payer: Self-pay | Admitting: Obstetrics & Gynecology

## 2012-03-07 VITALS — BP 130/90 | HR 86 | Temp 99.0°F | Ht 62.0 in | Wt 192.4 lb

## 2012-03-07 DIAGNOSIS — N944 Primary dysmenorrhea: Secondary | ICD-10-CM | POA: Insufficient documentation

## 2012-03-07 DIAGNOSIS — N946 Dysmenorrhea, unspecified: Secondary | ICD-10-CM

## 2012-03-07 MED ORDER — TRAMADOL HCL 50 MG PO TABS: 50.0000 mg | ORAL_TABLET | Freq: Four times a day (QID) | ORAL | Status: DC | PRN

## 2012-03-07 MED ORDER — DICLOFENAC POTASSIUM 50 MG PO TABS: 50.0000 mg | ORAL_TABLET | Freq: Three times a day (TID) | ORAL | Status: DC

## 2012-03-07 NOTE — Patient Instructions (Signed)
Levonorgestrel intrauterine device (IUD) What is this medicine? LEVONORGESTREL IUD (LEE voe nor jes trel) is a contraceptive (birth control) device. It is used to prevent pregnancy and to treat heavy bleeding that occurs during your period. It can be used for up to 5 years. This medicine may be used for other purposes; ask your health care provider or pharmacist if you have questions. What should I tell my health care provider before I take this medicine? They need to know if you have any of these conditions: -abnormal Pap smear -cancer of the breast, uterus, or cervix -diabetes -endometritis -genital or pelvic infection now or in the past -have more than one sexual partner or your partner has more than one partner -heart disease -history of an ectopic or tubal pregnancy -immune system problems -IUD in place -liver disease or tumor -problems with blood clots or take blood-thinners -use intravenous drugs -uterus of unusual shape -vaginal bleeding that has not been explained -an unusual or allergic reaction to levonorgestrel, other hormones, silicone, or polyethylene, medicines, foods, dyes, or preservatives -pregnant or trying to get pregnant -breast-feeding How should I use this medicine? This device is placed inside the uterus by a health care professional. Talk to your pediatrician regarding the use of this medicine in children. Special care may be needed. Overdosage: If you think you have taken too much of this medicine contact a poison control center or emergency room at once. NOTE: This medicine is only for you. Do not share this medicine with others. What if I miss a dose? This does not apply. What may interact with this medicine? Do not take this medicine with any of the following medications: -amprenavir -bosentan -fosamprenavir This medicine may also interact with the following medications: -aprepitant -barbiturate medicines for inducing sleep or treating  seizures -bexarotene -griseofulvin -medicines to treat seizures like carbamazepine, ethotoin, felbamate, oxcarbazepine, phenytoin, topiramate -modafinil -pioglitazone -rifabutin -rifampin -rifapentine -some medicines to treat HIV infection like atazanavir, indinavir, lopinavir, nelfinavir, tipranavir, ritonavir -St. John's wort -warfarin This list may not describe all possible interactions. Give your health care provider a list of all the medicines, herbs, non-prescription drugs, or dietary supplements you use. Also tell them if you smoke, drink alcohol, or use illegal drugs. Some items may interact with your medicine. What should I watch for while using this medicine? Visit your doctor or health care professional for regular check ups. See your doctor if you or your partner has sexual contact with others, becomes HIV positive, or gets a sexual transmitted disease. This product does not protect you against HIV infection (AIDS) or other sexually transmitted diseases. You can check the placement of the IUD yourself by reaching up to the top of your vagina with clean fingers to feel the threads. Do not pull on the threads. It is a good habit to check placement after each menstrual period. Call your doctor right away if you feel more of the IUD than just the threads or if you cannot feel the threads at all. The IUD may come out by itself. You may become pregnant if the device comes out. If you notice that the IUD has come out use a backup birth control method like condoms and call your health care provider. Using tampons will not change the position of the IUD and are okay to use during your period. What side effects may I notice from receiving this medicine? Side effects that you should report to your doctor or health care professional as soon as possible: -allergic reactions   like skin rash, itching or hives, swelling of the face, lips, or tongue -fever, flu-like symptoms -genital sores -high  blood pressure -no menstrual period for 6 weeks during use -pain, swelling, warmth in the leg -pelvic pain or tenderness -severe or sudden headache -signs of pregnancy -stomach cramping -sudden shortness of breath -trouble with balance, talking, or walking -unusual vaginal bleeding, discharge -yellowing of the eyes or skin Side effects that usually do not require medical attention (report to your doctor or health care professional if they continue or are bothersome): -acne -breast pain -change in sex drive or performance -changes in weight -cramping, dizziness, or faintness while the device is being inserted -headache -irregular menstrual bleeding within first 3 to 6 months of use -nausea This list may not describe all possible side effects. Call your doctor for medical advice about side effects. You may report side effects to FDA at 1-800-FDA-1088. Where should I keep my medicine? This does not apply. NOTE: This sheet is a summary. It may not cover all possible information. If you have questions about this medicine, talk to your doctor, pharmacist, or health care provider.  2012, [employer]. (04/24/2008 6:39:08 PM)Dysmenorrhea Menstrual pain is caused by the muscles of the uterus tightening (contracting) during a menstrual period. The muscles of the uterus contract due to the chemicals in the uterine lining. Primary dysmenorrhea is menstrual cramps that last a couple of days when you start having menstrual periods or soon after. This often begins after a teenager starts having her period. As a woman gets older or has a baby, the cramps will usually lesson or disappear. Secondary dysmenorrhea begins later in life, lasts longer, and the pain may be stronger than primary dysmenorrhea. The pain may start before the period and last a few days after the period. This type of dysmenorrhea is usually caused by an underlying problem such as:  The tissue lining the uterus grows outside  of the uterus in other areas of the body (endometriosis).  The endometrial tissue, which normally lines the uterus, is found in or grows into the muscular walls of the uterus (adenomyosis).  The pelvic blood vessels are engorged with blood just before the menstrual period (pelvic congestive syndrome).  Overgrowth of cells in the lining of the uterus or cervix (polyps of the uterus or cervix).  Falling down of the uterus (prolapse) because of loose or stretched ligaments.  Depression.  Bladder problems, infection, or inflammation.  Problems with the intestine, a tumor, or irritable bowel syndrome.  Cancer of the female organs or bladder.  A severely tipped uterus.  A very tight opening or closed cervix.  Noncancerous tumors of the uterus (fibroids).  Pelvic inflammatory disease (PID).  Pelvic scarring (adhesions) from a previous surgery.  Ovarian cyst.  An intrauterine device (IUD) used for birth control. CAUSES  The cause of menstrual pain is often unknown. SYMPTOMS   Cramping or throbbing pain in your lower abdomen.  Sometimes, a woman may also experience headaches.  Lower back pain.  Feeling sick to your stomach (nausea) or vomiting.  Diarrhea.  Sweating or dizziness. DIAGNOSIS  A diagnosis is based on your history, symptoms, physical examination, diagnostic tests, or procedures. Diagnostic tests or procedures may include:  Blood tests.  An ultrasound.  An examination of the lining of the uterus (dilation and curettage, D&C).  An examination inside your abdomen or pelvis with a scope (laparoscopy).  X-rays.  CT Scan.  MRI.  An examination inside the bladder with a scope (cystoscopy).    An examination inside the intestine or stomach with a scope (colonoscopy, gastroscopy). TREATMENT  Treatment depends on the cause of the dysmenorrhea. Treatment may include:  Pain medicine prescribed by your caregiver.  Birth control pills.  Hormone replacement  therapy.  Nonsteroidal anti-inflammatory drugs (NSAIDs). These may help stop the production of prostaglandins.  An IUD with progesterone hormone in it.  Acupuncture.  Surgery to remove adhesions, endometriosis, ovarian cyst, or fibroids.  Removal of the uterus (hysterectomy).  Progesterone shots to stop the menstrual period.  Cutting the nerves on the sacrum that go to the female organs (presacral neurectomy).  Electric currant to the sacral nerves (sacral nerve stimulation).  Antidepressant medicine.  Psychiatric therapy, counseling, or group therapy.  Exercise and physical therapy.  Meditation and yoga therapy. HOME CARE INSTRUCTIONS   Only take over-the-counter or prescription medicines for pain, discomfort, or fever as directed by your caregiver.  Place a heating pad or hot water bottle on your lower back or abdomen. Do not sleep with the heating pad.  Use aerobic exercises, walking, swimming, biking, and other exercises to help lessen the cramping.  Massage to the lower back or abdomen may help.  Stop smoking.  Avoid alcohol and caffeine.  Yoga, meditation, or acupuncture may help. SEEK MEDICAL CARE IF:   The pain does not get better with medicine.  You have pain with sexual intercourse. SEEK IMMEDIATE MEDICAL CARE IF:   Your pain increases and is not controlled with medicines.  You have a fever.  You develop nausea or vomiting with your period not controlled with medicine.  You have abnormal vaginal bleeding with your period.  You pass out. MAKE SURE YOU:   Understand these instructions.  Will watch your condition.  Will get help right away if you are not doing well or get worse. Document Released: 04/03/2005 Document Revised: 06/26/2011 Document Reviewed: 07/20/2008 [employer] Patient Information 2013 [employer].  

## 2012-03-07 NOTE — Progress Notes (Signed)
Subjective:     Patient ID: [name] [name], female   DOB: 20-Jul-1990, 21 y.o.   MRN: [id number]  HPI Pt was seen in the Ed for pain with her menses.  He was referred for evaluation of dysmenorrhea.  Has had pain with every cycle since menarche.  Sexually active. No contraception.  Planning to get married and get pregnant 'soon'.  Has taken multiple types of contraception and 'can't take the pill.'      sono and cx neg in the ED.  Pt reports multiple ER visits for dysmenorrhea  Review of Systems     Objective:   Physical ExamBP 130/90  Pulse 86  Temp 99 F (37.2 C) (Oral)  Ht 5\' 2"  (1.575 m)  Wt 192 lb 6.4 oz (87.272 kg)  BMI 35.19 kg/m2  LMP 02/15/2012 Lungs: CTA CV:  RRR Abd: Soft, NT, ND GU: EGBUS: no lesions Vagina: no blood in vault Cervix: no lesion; no mucopurulent d/c Uterus: small, mobile Adnexa: no masses; non tender          Assessment:     Primary Dysmenorrhea- d/w pt etiology and treatment options.  She declines any form of contraception as treatment.  Does not want anything that wold interfere with a pregnancy     Plan:     D/W pt Mirena for relief of sx- pt resistant but agreed to read literaure [place] prn dysmenorrhea F/u 3 months or sooner prn  Kyrianna Barletta L. Harraway-Smith, M.D., [place]

## 2012-03-26 ENCOUNTER — Encounter (HOSPITAL_BASED_OUTPATIENT_CLINIC_OR_DEPARTMENT_OTHER): Payer: Self-pay | Admitting: *Deleted

## 2012-03-26 ENCOUNTER — Emergency Department (HOSPITAL_BASED_OUTPATIENT_CLINIC_OR_DEPARTMENT_OTHER): Payer: Self-pay

## 2012-03-26 ENCOUNTER — Emergency Department (HOSPITAL_BASED_OUTPATIENT_CLINIC_OR_DEPARTMENT_OTHER)
Admission: EM | Admit: 2012-03-26 | Discharge: 2012-03-26 | Disposition: A | Discharge status: Home / Self Care | Payer: Self-pay | Attending: Emergency Medicine | Admitting: Emergency Medicine

## 2012-03-26 DIAGNOSIS — S99929A Unspecified injury of unspecified foot, initial encounter: Secondary | ICD-10-CM | POA: Insufficient documentation

## 2012-03-26 DIAGNOSIS — Y939 Activity, unspecified: Secondary | ICD-10-CM | POA: Insufficient documentation

## 2012-03-26 DIAGNOSIS — N946 Dysmenorrhea, unspecified: Secondary | ICD-10-CM | POA: Insufficient documentation

## 2012-03-26 DIAGNOSIS — R269 Unspecified abnormalities of gait and mobility: Secondary | ICD-10-CM | POA: Insufficient documentation

## 2012-03-26 DIAGNOSIS — F319 Bipolar disorder, unspecified: Secondary | ICD-10-CM | POA: Insufficient documentation

## 2012-03-26 DIAGNOSIS — W010XXA Fall on same level from slipping, tripping and stumbling without subsequent striking against object, initial encounter: Secondary | ICD-10-CM | POA: Insufficient documentation

## 2012-03-26 DIAGNOSIS — N944 Primary dysmenorrhea: Secondary | ICD-10-CM

## 2012-03-26 DIAGNOSIS — Y929 Unspecified place or not applicable: Secondary | ICD-10-CM | POA: Insufficient documentation

## 2012-03-26 DIAGNOSIS — S79919A Unspecified injury of unspecified hip, initial encounter: Secondary | ICD-10-CM

## 2012-03-26 DIAGNOSIS — F17200 Nicotine dependence, unspecified, uncomplicated: Secondary | ICD-10-CM | POA: Insufficient documentation

## 2012-03-26 DIAGNOSIS — Z8659 Personal history of other mental and behavioral disorders: Secondary | ICD-10-CM | POA: Insufficient documentation

## 2012-03-26 DIAGNOSIS — S8990XA Unspecified injury of unspecified lower leg, initial encounter: Secondary | ICD-10-CM | POA: Insufficient documentation

## 2012-03-26 MED ORDER — TRAMADOL HCL 50 MG PO TABS
100.0000 mg | ORAL_TABLET | Freq: Once | ORAL | Status: AC
  Administered 2012-03-26: 100 mg via ORAL
  Filled 2012-03-26: qty 2

## 2012-03-26 MED ORDER — TRAMADOL HCL 50 MG PO TABS: 50.0000 mg | ORAL_TABLET | Freq: Four times a day (QID) | ORAL | Status: DC | PRN

## 2012-03-26 NOTE — ED Provider Notes (Signed)
History     CSN: [id number]  Arrival date & time 03/26/12  1253   First MD Initiated Contact with Patient 03/26/12 1343      Chief Complaint  Patient presents with  . Hip Pain    (Consider location/radiation/quality/duration/timing/severity/associated sxs/prior treatment) Patient is a 21 y.o. female presenting with hip pain. The history is provided by the patient and medical records.  Hip Pain Associated symptoms include arthralgias and joint swelling. Pertinent negatives include no abdominal pain, chest pain, fatigue, fever, nausea, neck pain, numbness, rash or vomiting.    LIS SAVITT is a 22 y.o. female  with a hx of anxiety, PTSD, bipolar disorder presents to the Emergency Department complaining of acute, persistent, progressively worsening R hip onset yesterday afternoon after a fall. Pt slipped on the wet ground and fell with the L leg going forward and R leg folding underneath her when she fell. Associated symptoms include decreased ROM of the knee and hip.  Pt states the hip pain is worse than the knee pain.  Pt has taken ibuprofen and ice yesterday and today.  Resting and not walking makes it better and walking and bending the leg makes it worse.  Pt denies fever, chills, headache, neck pain, back pain, chest pain, shortness of breath, abdominal pain, nausea, vomiting, diarrhea.  Pt has been ambulatory with some difficulty 2/2 pain.     Past Medical History  Diagnosis Date  . Anxiety   . Bipolar 1 disorder   . Post traumatic stress disorder (PTSD)     History reviewed. No pertinent past surgical history.  No family history on file.  History  Substance Use Topics  . Smoking status: Current Every Day Smoker -- 0.5 packs/day    Types: Cigarettes  . Smokeless tobacco: Not on file  . Alcohol Use: Yes     Comment: occasionally    OB History    Grav Para Term Preterm Abortions TAB SAB Ect Mult Living   0 0 0 0 0 0 0 0 0 0       Review of Systems  Constitutional:  Negative for fever and fatigue.  HENT: Negative for neck pain and neck stiffness.   Respiratory: Negative for shortness of breath.   Cardiovascular: Negative for chest pain.  Gastrointestinal: Negative for nausea, vomiting, abdominal pain and diarrhea.  Musculoskeletal: Positive for joint swelling, arthralgias and gait problem. Negative for back pain.  Skin: Negative for rash and wound.  Neurological: Negative for numbness.  All other systems reviewed and are negative.    Allergies  Shellfish allergy; Amoxicillin-pot clavulanate; and Codeine  Home Medications   Current Outpatient Rx  Name  Route  Sig  Dispense  Refill  . DICLOFENAC POTASSIUM 50 MG PO TABS   Oral   Take 1 tablet (50 mg total) by mouth 3 (three) times daily. Take prior to the onset of dysmenorrhea until the sx abate.   30 tablet   2   . TRAMADOL HCL 50 MG PO TABS   Oral   Take 1 tablet (50 mg total) by mouth every 6 (six) hours as needed for pain (Pain not responsive to  NSAIDS).   30 tablet   0     BP 125/77  Pulse 86  Temp 98.1 F (36.7 C) (Oral)  Resp 20  SpO2 99%  LMP 03/12/2012  Physical Exam  Nursing note and vitals reviewed. Constitutional: She is oriented to person, place, and time. She appears well-developed and well-nourished. No distress.  HENT:  Head: Normocephalic and atraumatic.  Eyes: Conjunctivae normal are normal. Pupils are equal, round, and reactive to light.  Neck: Normal range of motion and full passive range of motion without pain. No spinous process tenderness and no muscular tenderness present.  Cardiovascular: Normal rate, regular rhythm, normal heart sounds and intact distal pulses.  Exam reveals no gallop and no friction rub.   No murmur heard. Pulses:      Radial pulses are 2+ on the right side, and 2+ on the left side.       Dorsalis pedis pulses are 2+ on the right side, and 2+ on the left side.       Posterior tibial pulses are 2+ on the right side, and 2+ on the left  side.       Capillary refill less than 3 seconds  Pulmonary/Chest: Effort normal and breath sounds normal. No respiratory distress. She has no wheezes. She has no rales. She exhibits no tenderness.  Musculoskeletal: She exhibits tenderness. She exhibits no edema.       Right hip: She exhibits decreased range of motion (2/2 pain) and tenderness. She exhibits normal strength, no bony tenderness, no swelling, no crepitus, no deformity and no laceration.       Right knee: She exhibits decreased range of motion (2/2 pain). She exhibits no swelling, no effusion, no ecchymosis, no deformity, no laceration, no erythema, normal alignment, no LCL laxity, normal patellar mobility, normal meniscus and no MCL laxity. tenderness found. Medial joint line and lateral joint line tenderness noted.       Legs:      ROM: ROM of right hip and knee limited by pain  Neurological: She is alert and oriented to person, place, and time. She has normal strength and normal reflexes. No sensory deficit. She exhibits normal muscle tone. Coordination normal. GCS eye subscore is 4. GCS verbal subscore is 5. GCS motor subscore is 6.  Reflex Scores:      Tricep reflexes are 2+ on the right side and 2+ on the left side.      Bicep reflexes are 2+ on the right side and 2+ on the left side.      Brachioradialis reflexes are 2+ on the right side and 2+ on the left side.      Patellar reflexes are 2+ on the right side and 2+ on the left side.      Achilles reflexes are 2+ on the right side and 2+ on the left side.      Sensation to dull and sharp intact to the LE bilaterally Strength 5/5 in the toes, ankle, knee, and hip bilaterally with pain in the right hip and knee  Skin: Skin is warm and dry. No rash noted. She is not diaphoretic. No erythema.       No contusion, abrasion or ecchymosis noted on the hip or thigh    ED Course  Procedures (including critical care time)  Labs Reviewed - No data to display Dg Hip Complete  Right  03/26/2012  *RADIOLOGY REPORT*  Clinical Data: Right hip and leg pain post fall  RIGHT HIP - COMPLETE 2+ VIEW  Comparison: None  Findings: Osseous mineralization normal. Symmetric preserved hip and SI joints. Partial sacralization right transverse process L5. No acute fracture, dislocation, or bone destruction.  IMPRESSION: No acute osseous abnormalities.   Original Report Authenticated By: Ulyses Southward, M.D.    Dg Knee Complete 4 Views Right  03/26/2012  *RADIOLOGY REPORT*  Clinical Data: Right  knee pain post fall  RIGHT KNEE - COMPLETE 4+ VIEW  Comparison: 09/02/2009  Findings: Osseous mineralization normal. Joint spaces preserved. No acute fracture, dislocation or bone destruction. No definite knee joint effusion.  IMPRESSION: No acute abnormalities.   Original Report Authenticated By: Ulyses Southward, M.D.      1. Knee injury   2. Primary dysmenorrhea   3. Injury, hip and thigh       MDM  Evalyn Casco Hada presents with hip and knee pain after fall.  X-rays without acute osseous abnormalities of the hip, knee or femur.  Pain control given in the department and pt ambulatory without significant difficulty.  I discussed the findings with the patient and the plan for pain control at home.  I also discussed conservative therapies for her hip and knee pain. I have also discussed reasons to return immediately to the ER.  Patient expresses understanding and agrees with plan.   1. Medications: ultram, usual home medications  2. Treatment: rest, drink plenty of fluids, gently stretching, alternate heat and ice  3. Follow Up: Please followup with your primary doctor for discussion of your diagnoses and further evaluation after today's visit; if you do not have a primary care doctor use the resource guide provided to find one   Dierdre Forth, PA-C 03/26/12 1542

## 2012-03-26 NOTE — ED Provider Notes (Signed)
Medical screening examination/treatment/procedure(s) were performed by non-physician practitioner and as supervising physician I was immediately available for consultation/collaboration.   Carleene Cooper III, MD 03/26/12 2051

## 2012-03-26 NOTE — ED Notes (Signed)
Fell yesterday and hurt her right hip. Pain in her upper leg and hip.

## 2012-04-17 NOTE — L&D Delivery Note (Signed)
Delivery Note At 3:23 AM a viable female was delivered via Vaginal, Vacuum (Extractor) (Presentation: Right Occiput Anterior).  APGAR: 8, 9; weight pending.   Placenta status: Intact, Spontaneous.  Cord: 3 vessels with the following complications: None.  Outlet vacuum done with one pull due to ineffective pushing and maternal discomfort with Bell cup after 2 hours of pushing.  Anesthesia: Epidural  Episiotomy: None Lacerations: 2nd degree Suture Repair: 3.0 vicryl Est. Blood Loss (mL): 350  Mom to AICU.  Baby to stay with mom.  I need her to stay at [facility] for postpartum recovery until Monday morning, she can then be transferred back to [facility] on the pulmonary service.    Deshay Blumenfeld D 01/05/2013, 3:46 AM

## 2012-06-10 ENCOUNTER — Emergency Department (HOSPITAL_BASED_OUTPATIENT_CLINIC_OR_DEPARTMENT_OTHER): Payer: Medicaid Other

## 2012-06-10 ENCOUNTER — Emergency Department (HOSPITAL_BASED_OUTPATIENT_CLINIC_OR_DEPARTMENT_OTHER)
Admission: EM | Admit: 2012-06-10 | Discharge: 2012-06-10 | Disposition: A | Discharge status: Home / Self Care | Payer: Medicaid Other | Attending: Emergency Medicine | Admitting: Emergency Medicine

## 2012-06-10 ENCOUNTER — Encounter (HOSPITAL_BASED_OUTPATIENT_CLINIC_OR_DEPARTMENT_OTHER): Payer: Self-pay | Admitting: *Deleted

## 2012-06-10 DIAGNOSIS — Z3490 Encounter for supervision of normal pregnancy, unspecified, unspecified trimester: Secondary | ICD-10-CM

## 2012-06-10 DIAGNOSIS — O9989 Other specified diseases and conditions complicating pregnancy, childbirth and the puerperium: Secondary | ICD-10-CM | POA: Insufficient documentation

## 2012-06-10 DIAGNOSIS — Z8659 Personal history of other mental and behavioral disorders: Secondary | ICD-10-CM | POA: Insufficient documentation

## 2012-06-10 DIAGNOSIS — R109 Unspecified abdominal pain: Secondary | ICD-10-CM | POA: Insufficient documentation

## 2012-06-10 DIAGNOSIS — F17200 Nicotine dependence, unspecified, uncomplicated: Secondary | ICD-10-CM | POA: Insufficient documentation

## 2012-06-10 LAB — URINALYSIS, ROUTINE W REFLEX MICROSCOPIC
Bilirubin Urine: NEGATIVE
Hgb urine dipstick: NEGATIVE
Protein, ur: NEGATIVE mg/dL
Urobilinogen, UA: 0.2 mg/dL (ref 0.0–1.0)

## 2012-06-10 LAB — PREGNANCY, URINE: Preg Test, Ur: POSITIVE — AB

## 2012-06-10 MED ORDER — PRENATAL COMPLETE 14-0.4 MG PO TABS: ORAL_TABLET | ORAL | Status: DC

## 2012-06-10 NOTE — ED Provider Notes (Signed)
History     CSN: [id number]  Arrival date & time 06/10/12  2006   First MD Initiated Contact with Patient 06/10/12 2100      Chief Complaint  Patient presents with  . Abdominal Pain    (Consider location/radiation/quality/duration/timing/severity/associated sxs/prior treatment) Patient is a 22 y.o. female presenting with cramps. The history is provided by the patient. No language interpreter was used.  Abdominal Cramping This is a new problem. Episode onset: 3. The problem occurs constantly. The problem has been gradually worsening. Associated symptoms include abdominal pain. Nothing aggravates the symptoms. She has tried nothing for the symptoms.  Pt had positive home pregnancy test. Pt worried that something is wrong.    Past Medical History  Diagnosis Date  . Anxiety   . Bipolar 1 disorder   . Post traumatic stress disorder (PTSD)     History reviewed. No pertinent past surgical history.  No family history on file.  History  Substance Use Topics  . Smoking status: Current Every Day Smoker -- 0.50 packs/day    Types: Cigarettes  . Smokeless tobacco: Never Used  . Alcohol Use: Yes     Comment: occasionally    OB History   Grav Para Term Preterm Abortions TAB SAB Ect Mult Living   0 0 0 0 0 0 0 0 0 0       Review of Systems  Gastrointestinal: Positive for abdominal pain.  All other systems reviewed and are negative.    Allergies  Shellfish allergy; Amoxicillin-pot clavulanate; and Codeine  Home Medications   Current Outpatient Rx  Name  Route  Sig  Dispense  Refill  . diclofenac (CATAFLAM) 50 MG tablet   Oral   Take 1 tablet (50 mg total) by mouth 3 (three) times daily. Take prior to the onset of dysmenorrhea until the sx abate.   30 tablet   2   . traMADol (ULTRAM) 50 MG tablet   Oral   Take 1 tablet (50 mg total) by mouth every 6 (six) hours as needed for pain (Pain not responsive to  NSAIDS).   30 tablet   0     BP 153/87  Pulse 103   Temp(Src) 98.8 F (37.1 C) (Oral)  Resp 20  SpO2 100%  LMP 05/11/2012  Physical Exam  Nursing note and vitals reviewed. Constitutional: She is oriented to person, place, and time. She appears well-developed and well-nourished.  HENT:  Head: Normocephalic.  Eyes: Pupils are equal, round, and reactive to light.  Neck: Normal range of motion.  Cardiovascular: Normal rate and normal heart sounds.   Pulmonary/Chest: Effort normal.  Abdominal: Soft.  Genitourinary: Vagina normal and uterus normal.  Musculoskeletal: Normal range of motion.  Neurological: She is alert and oriented to person, place, and time.  Skin: Skin is warm.  Psychiatric: She has a normal mood and affect.    ED Course  Procedures (including critical care time)  Labs Reviewed  PREGNANCY, URINE - Abnormal; Notable for the following:    Preg Test, Ur POSITIVE (*)    All other components within normal limits  URINALYSIS, ROUTINE W REFLEX MICROSCOPIC   No results found.   1. Pregnancy       MDM   Results for orders placed during the hospital encounter of 06/10/12  PREGNANCY, URINE      Result Value Range   Preg Test, Ur POSITIVE (*) NEGATIVE  URINALYSIS, ROUTINE W REFLEX MICROSCOPIC      Result Value Range   Color, Urine  YELLOW  YELLOW   APPearance CLEAR  CLEAR   Specific Gravity, Urine 1.013  1.005 - 1.030   pH 6.0  5.0 - 8.0   Glucose, UA NEGATIVE  NEGATIVE mg/dL   Hgb urine dipstick NEGATIVE  NEGATIVE   Bilirubin Urine NEGATIVE  NEGATIVE   Ketones, ur NEGATIVE  NEGATIVE mg/dL   Protein, ur NEGATIVE  NEGATIVE mg/dL   Urobilinogen, UA 0.2  0.0 - 1.0 mg/dL   Nitrite NEGATIVE  NEGATIVE   Leukocytes, UA NEGATIVE  NEGATIVE   US Ob Comp Less 14 Wks  06/10/2012  *RADIOLOGY REPORT*  Clinical Data: 22 year old pregnant female with pelvic pain. Estimated gestational age of [redacted] weeks 2 days by LMP.  OBSTETRIC <14 WK [place] AND TRANSVAGINAL OB US  Technique:  Both transabdominal and transvaginal ultrasound  examinations were performed for complete evaluation of the gestation as well as the maternal uterus, adnexal regions, and pelvic cul-de-sac.  Transvaginal technique was performed to assess early pregnancy.  Comparison:  None  Intrauterine gestational sac:  Visualized/normal in shape. Yolk sac: Visualized Embryo: Visualized Cardiac Activity: Visualized Heart Rate: 135 bpm  MSD: 4.9 mm  5 w 2 d    [place] EDC: 02/06/2013  Maternal uterus/adnexae: There is no evidence of subchorionic hemorrhage. The ovaries bilaterally are unremarkable. There is no evidence of free fluid or adnexal mass.  IMPRESSION: Single living intrauterine gestation with estimated gestational age of [redacted] weeks 2 days by this ultrasound.  No evidence of subchorionic hemorrhage.   Original Report Authenticated By: Harmon Pier, M.D.    US Ob Transvaginal  06/10/2012  *RADIOLOGY REPORT*  Clinical Data: 22 year old pregnant female with pelvic pain. Estimated gestational age of [redacted] weeks 2 days by LMP.  OBSTETRIC <14 WK [place] AND TRANSVAGINAL OB US  Technique:  Both transabdominal and transvaginal ultrasound examinations were performed for complete evaluation of the gestation as well as the maternal uterus, adnexal regions, and pelvic cul-de-sac.  Transvaginal technique was performed to assess early pregnancy.  Comparison:  None  Intrauterine gestational sac:  Visualized/normal in shape. Yolk sac: Visualized Embryo: Visualized Cardiac Activity: Visualized Heart Rate: 135 bpm  MSD: 4.9 mm  5 w 2 d    [place] EDC: 02/06/2013  Maternal uterus/adnexae: There is no evidence of subchorionic hemorrhage. The ovaries bilaterally are unremarkable. There is no evidence of free fluid or adnexal mass.  IMPRESSION: Single living intrauterine gestation with estimated gestational age of [redacted] weeks 2 days by this ultrasound.  No evidence of subchorionic hemorrhage.   Original Report Authenticated By: Harmon Pier, M.D.             Lonia Skinner [place], [place] 06/10/12 2250

## 2012-06-10 NOTE — ED Notes (Addendum)
C/o crampy abd pain x 3 weeks- reports +home preg test today

## 2012-06-11 NOTE — ED Provider Notes (Signed)
Medical screening examination/treatment/procedure(s) were performed by non-physician practitioner and as supervising physician I was immediately available for consultation/collaboration.  Crescentia Boutwell, MD 06/11/12 0016 

## 2012-06-12 LAB — GC/CHLAMYDIA PROBE AMP
CT Probe RNA: NEGATIVE
GC Probe RNA: NEGATIVE

## 2012-07-15 LAB — OB RESULTS CONSOLE ABO/RH: RH Type: POSITIVE

## 2012-07-15 LAB — OB RESULTS CONSOLE HIV ANTIBODY (ROUTINE TESTING): HIV: NONREACTIVE

## 2012-07-15 LAB — OB RESULTS CONSOLE RPR: RPR: NONREACTIVE

## 2012-07-15 LAB — OB RESULTS CONSOLE GC/CHLAMYDIA: Chlamydia: NEGATIVE

## 2012-09-11 ENCOUNTER — Encounter (HOSPITAL_BASED_OUTPATIENT_CLINIC_OR_DEPARTMENT_OTHER): Payer: Self-pay | Admitting: *Deleted

## 2012-09-11 ENCOUNTER — Emergency Department (HOSPITAL_BASED_OUTPATIENT_CLINIC_OR_DEPARTMENT_OTHER)
Admission: EM | Admit: 2012-09-11 | Discharge: 2012-09-11 | Disposition: A | Discharge status: Home / Self Care | Payer: Medicaid Other | Attending: Emergency Medicine | Admitting: Emergency Medicine

## 2012-09-11 DIAGNOSIS — F17200 Nicotine dependence, unspecified, uncomplicated: Secondary | ICD-10-CM | POA: Insufficient documentation

## 2012-09-11 DIAGNOSIS — J029 Acute pharyngitis, unspecified: Secondary | ICD-10-CM

## 2012-09-11 DIAGNOSIS — F319 Bipolar disorder, unspecified: Secondary | ICD-10-CM | POA: Insufficient documentation

## 2012-09-11 DIAGNOSIS — R1310 Dysphagia, unspecified: Secondary | ICD-10-CM | POA: Insufficient documentation

## 2012-09-11 DIAGNOSIS — R599 Enlarged lymph nodes, unspecified: Secondary | ICD-10-CM | POA: Insufficient documentation

## 2012-09-11 DIAGNOSIS — Z8659 Personal history of other mental and behavioral disorders: Secondary | ICD-10-CM | POA: Insufficient documentation

## 2012-09-11 DIAGNOSIS — F411 Generalized anxiety disorder: Secondary | ICD-10-CM | POA: Insufficient documentation

## 2012-09-11 DIAGNOSIS — R498 Other voice and resonance disorders: Secondary | ICD-10-CM | POA: Insufficient documentation

## 2012-09-11 DIAGNOSIS — Z880 Allergy status to penicillin: Secondary | ICD-10-CM | POA: Insufficient documentation

## 2012-09-11 MED ORDER — ACETAMINOPHEN 500 MG PO TABS: 500.0000 mg | ORAL_TABLET | Freq: Four times a day (QID) | ORAL | Status: DC | PRN

## 2012-09-11 NOTE — ED Provider Notes (Signed)
History     CSN: [id number]  Arrival date & time 09/11/12  0218   First MD Initiated Contact with Patient 09/11/12 0256      Chief Complaint  Patient presents with  . Sore Throat    (Consider location/radiation/quality/duration/timing/severity/associated sxs/prior treatment) HPI Comments: Pt comes in with cc of sore thorat. Pt is pregnant, in her late 2nd trimester. Pt states that she developed a severe sorethroat this afternoon. No pain meds taken. She has pain with swallowing, and states her voice has changed a little. There is no n/v/f/c. Pt has no trismus, no dib.   Patient is a 22 y.o. female presenting with pharyngitis. The history is provided by the patient.  Sore Throat Pertinent negatives include no chest pain, no abdominal pain, no headaches and no shortness of breath.    Past Medical History  Diagnosis Date  . Anxiety   . Bipolar 1 disorder   . Post traumatic stress disorder (PTSD)     History reviewed. No pertinent past surgical history.  History reviewed. No pertinent family history.  History  Substance Use Topics  . Smoking status: Current Some Day Smoker -- 0.50 packs/day    Types: Cigarettes  . Smokeless tobacco: Never Used  . Alcohol Use: Yes     Comment: occasionally    OB History   Grav Para Term Preterm Abortions TAB SAB Ect Mult Living   1 0 0 0 0 0 0 0 0 0       Review of Systems  Constitutional: Negative for activity change.  HENT: Positive for sore throat and voice change. Negative for rhinorrhea, drooling, trouble swallowing, neck pain and postnasal drip.   Respiratory: Negative for shortness of breath.   Cardiovascular: Negative for chest pain.  Gastrointestinal: Negative for nausea, vomiting and abdominal pain.  Genitourinary: Negative for dysuria.  Neurological: Negative for headaches.    Allergies  Shellfish allergy; Amoxicillin-pot clavulanate; and Codeine  Home Medications   Current Outpatient Rx  Name  Route  Sig   Dispense  Refill  . ondansetron (ZOFRAN) 4 MG tablet   Oral   Take 4 mg by mouth every 8 (eight) hours as needed for nausea.         . diclofenac (CATAFLAM) 50 MG tablet   Oral   Take 1 tablet (50 mg total) by mouth 3 (three) times daily. Take prior to the onset of dysmenorrhea until the sx abate.   30 tablet   2   . Prenatal Vit-Fe Fumarate-FA (PRENATAL COMPLETE) 14-0.4 MG TABS      One tablet a day.   60 each   3   . traMADol (ULTRAM) 50 MG tablet   Oral   Take 1 tablet (50 mg total) by mouth every 6 (six) hours as needed for pain (Pain not responsive to  NSAIDS).   30 tablet   0     BP 124/78  Pulse 83  Temp(Src) 98 F (36.7 C) (Oral)  Resp 20  Ht 5\' 2"  (1.575 m)  Wt 186 lb (84.369 kg)  BMI 34.01 kg/m2  SpO2 98%  LMP 05/13/2012  Physical Exam  Nursing note and vitals reviewed. Constitutional: She is oriented to person, place, and time. She appears well-developed and well-nourished.  HENT:  Head: Normocephalic and atraumatic.  Right Ear: External ear normal.  Mouth/Throat: Oropharynx is clear and moist. No oropharyngeal exudate.  Eyes: EOM are normal. Pupils are equal, round, and reactive to light.  Neck: Neck supple.  Cardiovascular: Normal rate,  regular rhythm and normal heart sounds.   No murmur heard. Pulmonary/Chest: Effort normal. No stridor. No respiratory distress.  Abdominal: Soft. She exhibits no distension. There is no tenderness. There is no rebound and no guarding.  Lymphadenopathy:    She has cervical adenopathy.  Neurological: She is alert and oriented to person, place, and time.  Skin: Skin is warm and dry.    ED Course  Procedures (including critical care time)  Labs Reviewed - No data to display No results found.   No diagnosis found.    MDM  Pt comes in with cc of sore throat. Throat exam is completely benign, although she does have some cervical lymphadenopathy. Low suspicions for Strep, centor score is 2 (no cough, mild  lymphadenopathy). No respiratory distress.  Tylenol for tx.  Derwood Kaplan, MD 09/11/12 [phone]

## 2012-09-11 NOTE — ED Notes (Signed)
Pt requesting steroid injection for her sore throat. Will check with MD for additional orders.

## 2012-09-11 NOTE — ED Notes (Signed)
C/o sore throat and ears itching since yesterday

## 2012-10-14 ENCOUNTER — Inpatient Hospital Stay (HOSPITAL_COMMUNITY)
Admission: AD | Admit: 2012-10-14 | Discharge: 2012-10-14 | Disposition: A | Discharge status: Home / Self Care | Payer: Medicaid Other | Source: Ambulatory Visit | Attending: Obstetrics and Gynecology | Admitting: Obstetrics and Gynecology

## 2012-10-14 ENCOUNTER — Encounter (HOSPITAL_COMMUNITY): Payer: Self-pay | Admitting: *Deleted

## 2012-10-14 DIAGNOSIS — N390 Urinary tract infection, site not specified: Secondary | ICD-10-CM | POA: Insufficient documentation

## 2012-10-14 DIAGNOSIS — R1031 Right lower quadrant pain: Secondary | ICD-10-CM | POA: Insufficient documentation

## 2012-10-14 DIAGNOSIS — O2390 Unspecified genitourinary tract infection in pregnancy, unspecified trimester: Secondary | ICD-10-CM | POA: Insufficient documentation

## 2012-10-14 HISTORY — DX: Major depressive disorder, single episode, unspecified: F32.9

## 2012-10-14 HISTORY — DX: Other chronic pain: G89.29

## 2012-10-14 HISTORY — DX: Other fracture of unspecified lower leg, initial encounter for closed fracture: S82.899A

## 2012-10-14 HISTORY — DX: Unspecified infectious disease: B99.9

## 2012-10-14 HISTORY — DX: Depression, unspecified: F32.A

## 2012-10-14 LAB — CBC
HCT: 31.5 % — ABNORMAL LOW (ref 36.0–46.0)
Hemoglobin: 10.3 g/dL — ABNORMAL LOW (ref 12.0–15.0)
RDW: 13.7 % (ref 11.5–15.5)
WBC: 10.8 10*3/uL — ABNORMAL HIGH (ref 4.0–10.5)

## 2012-10-14 LAB — URINALYSIS, ROUTINE W REFLEX MICROSCOPIC
Nitrite: NEGATIVE
Specific Gravity, Urine: 1.025 (ref 1.005–1.030)
Urobilinogen, UA: 0.2 mg/dL (ref 0.0–1.0)

## 2012-10-14 LAB — WET PREP, GENITAL
Clue Cells Wet Prep HPF POC: NONE SEEN
Trich, Wet Prep: NONE SEEN

## 2012-10-14 LAB — URINE MICROSCOPIC-ADD ON

## 2012-10-14 MED ORDER — CEPHALEXIN 500 MG PO CAPS: 500.0000 mg | ORAL_CAPSULE | Freq: Three times a day (TID) | ORAL | Status: DC

## 2012-10-14 MED ORDER — OXYCODONE-ACETAMINOPHEN 5-325 MG PO TABS
1.0000 | ORAL_TABLET | Freq: Once | ORAL | Status: AC
  Administered 2012-10-14: 1 via ORAL
  Filled 2012-10-14: qty 1

## 2012-10-14 MED ORDER — ONDANSETRON 8 MG PO TBDP
8.0000 mg | ORAL_TABLET | Freq: Once | ORAL | Status: AC
  Administered 2012-10-14: 8 mg via ORAL
  Filled 2012-10-14: qty 1

## 2012-10-14 NOTE — MAU Note (Signed)
Patient states she has been having right side radiating to the right flank pain for 1 1/2 days. Denies bleeding or leaking and reports feeling movement.

## 2012-10-14 NOTE — MAU Provider Note (Signed)
History     CSN: [id number]  Arrival date and time: 10/14/12 4098   First Provider Initiated Contact with Patient 10/14/12 [phone]      Chief Complaint  Patient presents with  . Abdominal Pain  . Flank Pain   HPI 21 y.o. G1P0000 at [redacted]w[redacted]d with right low abd pain x 2-3 days, feels the pain several times a day, lasts an hour or two, no aggravating or alleviating factors, sharp in nature. + nausea, no vomiting, diarrhea, constipation, fever, chills.   Past Medical History  Diagnosis Date  . Anxiety   . Bipolar 1 disorder   . Post traumatic stress disorder (PTSD)   . Chronic pain     muscle spasms- entire body  . Infection     UTI  . Fracture, ankle     left- x2  . Depression     doing fine now    Past Surgical History  Procedure Laterality Date  . No past surgeries      Family History  Problem Relation Age of Onset  . Heart disease Mother   . Hypertension Father   . Cancer Paternal Grandmother     lung  . Heart disease Paternal Grandmother     History  Substance Use Topics  . Smoking status: Current Some Day Smoker -- 0.25 packs/day    Types: Cigarettes  . Smokeless tobacco: Never Used     Comment: has cut down  . Alcohol Use: Yes     Comment: occasionally-not now    Allergies:  Allergies  Allergen Reactions  . Cinnamon Anaphylaxis and Hives  . Shellfish Allergy Anaphylaxis  . Amoxicillin-Pot Clavulanate Nausea And Vomiting  . Codeine Nausea And Vomiting  . Nickel Rash    Prescriptions prior to admission  Medication Sig Dispense Refill  . acetaminophen (TYLENOL) 500 MG tablet Take 1 tablet (500 mg total) by mouth every 6 (six) hours as needed for pain.  30 tablet  0  . ondansetron (ZOFRAN) 8 MG tablet Take 8 mg by mouth every 8 (eight) hours as needed for nausea.      . pantoprazole (PROTONIX) 40 MG tablet Take 40 mg by mouth daily.      Marland Kitchen PRESCRIPTION MEDICATION Place 1 application vaginally daily as needed (for yeast infection).         ROS Physical Exam   Blood pressure 123/73, pulse 76, temperature 98 F (36.7 C), temperature source Oral, resp. rate 18, height 5\' 3"  (1.6 m), weight 191 lb 3.2 oz (86.728 kg), last menstrual period 05/13/2012.  Physical Exam  Nursing note and vitals reviewed. Constitutional: She is oriented to person, place, and time. She appears well-developed and well-nourished. No distress.  HENT:  Head: Normocephalic and atraumatic.  Cardiovascular: Normal rate.   Respiratory: Effort normal.  GI: Soft. She exhibits no mass. There is no tenderness. There is no rebound and no guarding.  Genitourinary: There is no rash or lesion on the right labia. There is no rash or lesion on the left labia. Uterus is not tender. Enlarged: Size c/w dates. No tenderness or bleeding around the vagina. No vaginal discharge found.  Dilation: Closed Effacement (%): Thick Cervical Position: Posterior Exam by:: Telford Nab CNM   Musculoskeletal: Normal range of motion.  Neurological: She is alert and oriented to person, place, and time.  Skin: Skin is warm and dry.  Psychiatric: She has a normal mood and affect.    MAU Course  Procedures  Results for orders placed during the hospital  encounter of 10/14/12 (from the past 24 hour(s))  URINALYSIS, ROUTINE W REFLEX MICROSCOPIC     Status: Abnormal   Collection Time    10/14/12  7:30 AM      Result Value Range   Color, Urine YELLOW  YELLOW   APPearance CLEAR  CLEAR   Specific Gravity, Urine 1.025  1.005 - 1.030   pH 6.5  5.0 - 8.0   Glucose, UA NEGATIVE  NEGATIVE mg/dL   Hgb urine dipstick NEGATIVE  NEGATIVE   Bilirubin Urine NEGATIVE  NEGATIVE   Ketones, ur NEGATIVE  NEGATIVE mg/dL   Protein, ur NEGATIVE  NEGATIVE mg/dL   Urobilinogen, UA 0.2  0.0 - 1.0 mg/dL   Nitrite NEGATIVE  NEGATIVE   Leukocytes, UA MODERATE (*) NEGATIVE  URINE MICROSCOPIC-ADD ON     Status: Abnormal   Collection Time    10/14/12  7:30 AM      Result Value Range   Squamous  Epithelial / LPF RARE  RARE   WBC, UA 3-6  <3 WBC/hpf   Bacteria, UA FEW (*) RARE  CBC     Status: Abnormal   Collection Time    10/14/12  8:44 AM      Result Value Range   WBC 10.8 (*) 4.0 - 10.5 K/uL   RBC 3.92  3.87 - 5.11 MIL/uL   Hemoglobin 10.3 (*) 12.0 - 15.0 g/dL   HCT 30.8 (*) 65.7 - 84.6 %   MCV 80.4  78.0 - 100.0 fL   MCH 26.3  26.0 - 34.0 pg   MCHC 32.7  30.0 - 36.0 g/dL   RDW 96.2  95.2 - 84.1 %   Platelets 265  150 - 400 K/uL  WET PREP, GENITAL     Status: Abnormal   Collection Time    10/14/12  9:00 AM      Result Value Range   Yeast Wet Prep HPF POC NONE SEEN  NONE SEEN   Trich, Wet Prep NONE SEEN  NONE SEEN   Clue Cells Wet Prep HPF POC NONE SEEN  NONE SEEN   WBC, Wet Prep HPF POC FEW (*) NONE SEEN     Assessment and Plan   1. UTI (urinary tract infection)   Rev'd precautions, f/u as scheduled or PRN. Rx keflex, culture pending.     Medication List         acetaminophen 500 MG tablet  Commonly known as:  TYLENOL  Take 1 tablet (500 mg total) by mouth every 6 (six) hours as needed for pain.     cephALEXin 500 MG capsule  Commonly known as:  KEFLEX  Take 1 capsule (500 mg total) by mouth 3 (three) times daily.     ondansetron 8 MG tablet  Commonly known as:  ZOFRAN  Take 8 mg by mouth every 8 (eight) hours as needed for nausea.     pantoprazole 40 MG tablet  Commonly known as:  PROTONIX  Take 40 mg by mouth daily.     terconazole 0.8 % vaginal cream  Commonly known as:  TERAZOL 3  Place 1 applicator vaginally daily as needed (for yeast infection).            Follow-up Information   Follow up with Bing Plume, MD. (as scheduled)    Contact information:   [address], [address] [place] [address] [phone]         Reham Slabaugh 10/14/2012, 8:07 AM

## 2012-10-14 NOTE — MAU Note (Signed)
abd pain started a couple days ago, crampy- rt lower quad.  Neg CVA pain.

## 2012-10-15 LAB — URINE CULTURE

## 2012-10-18 ENCOUNTER — Inpatient Hospital Stay (HOSPITAL_COMMUNITY): Payer: Medicaid Other

## 2012-10-18 ENCOUNTER — Encounter (HOSPITAL_COMMUNITY): Payer: Self-pay | Admitting: *Deleted

## 2012-10-18 ENCOUNTER — Inpatient Hospital Stay (HOSPITAL_COMMUNITY)
Admission: AD | Admit: 2012-10-18 | Discharge: 2012-10-18 | Disposition: A | Discharge status: Home / Self Care | Payer: Medicaid Other | Source: Ambulatory Visit | Attending: Obstetrics and Gynecology | Admitting: Obstetrics and Gynecology

## 2012-10-18 DIAGNOSIS — M549 Dorsalgia, unspecified: Secondary | ICD-10-CM | POA: Insufficient documentation

## 2012-10-18 DIAGNOSIS — N1330 Unspecified hydronephrosis: Secondary | ICD-10-CM

## 2012-10-18 DIAGNOSIS — O26839 Pregnancy related renal disease, unspecified trimester: Secondary | ICD-10-CM | POA: Insufficient documentation

## 2012-10-18 DIAGNOSIS — R109 Unspecified abdominal pain: Secondary | ICD-10-CM | POA: Insufficient documentation

## 2012-10-18 LAB — URINALYSIS, ROUTINE W REFLEX MICROSCOPIC
Hgb urine dipstick: NEGATIVE
Ketones, ur: NEGATIVE mg/dL
Protein, ur: NEGATIVE mg/dL
Urobilinogen, UA: 0.2 mg/dL (ref 0.0–1.0)

## 2012-10-18 LAB — URINE MICROSCOPIC-ADD ON

## 2012-10-18 MED ORDER — ONDANSETRON 8 MG PO TBDP
8.0000 mg | ORAL_TABLET | ORAL | Status: AC
  Administered 2012-10-18: 8 mg via ORAL
  Filled 2012-10-18: qty 1

## 2012-10-18 MED ORDER — PROMETHAZINE HCL 25 MG PO TABS
25.0000 mg | ORAL_TABLET | ORAL | Status: DC
  Filled 2012-10-18: qty 1

## 2012-10-18 MED ORDER — OXYCODONE-ACETAMINOPHEN 5-325 MG PO TABS
2.0000 | ORAL_TABLET | ORAL | Status: AC
  Administered 2012-10-18: 2 via ORAL
  Filled 2012-10-18: qty 2

## 2012-10-18 MED ORDER — OXYCODONE-ACETAMINOPHEN 5-325 MG PO TABS: 1.0000 | ORAL_TABLET | Freq: Four times a day (QID) | ORAL | Status: DC | PRN

## 2012-10-18 NOTE — MAU Note (Signed)
Pt seen in MAU about 3 days ago, dx with UTI, started Keflex.  Pain continued to worsen.  Pt was seen at her medical MD had a renal U/S performed.  Pt referred to Urology.   Pt returns to MAU with increased back and abd pain.  Not taking anything for pain.

## 2012-10-18 NOTE — MAU Note (Signed)
Bonna Gains NP at the bedside.

## 2012-10-18 NOTE — MAU Provider Note (Signed)
Chief Complaint:  Back Pain and Abdominal Pain   First Provider Initiated Contact with Patient 10/18/12 2020      HPI: [name] [name] is a 22 y.o. G1P0000 pt of Dr Jackelyn Knife at [redacted]w[redacted]d who presents to maternity admissions reporting constant back and abdominal pain r/t recent diagnosis of hydronephrosis.  She reports having imaging and visits with her primary care doctor within last 2 weeks and has a urology referral next week.  Today the pain increased and she called her primary care doctor who told her to come to [facility].  She reports good fetal movement, denies LOF, vaginal bleeding, vaginal itching/burning, urinary symptoms, h/a, dizziness, n/v, or fever/chills.     Past Medical History: Past Medical History  Diagnosis Date  . Anxiety   . Bipolar 1 disorder   . Post traumatic stress disorder (PTSD)   . Chronic pain     muscle spasms- entire body  . Infection     UTI  . Fracture, ankle     left- x2  . Depression     doing fine now    Past obstetric history: OB History   Grav Para Term Preterm Abortions TAB SAB Ect Mult Living   1 0 0 0 0 0 0 0 0 0      # Outc Date GA Lbr Len/2nd Wgt Sex Del Anes PTL Lv   1 CUR               Past Surgical History: Past Surgical History  Procedure Laterality Date  . No past surgeries      Family History: Family History  Problem Relation Age of Onset  . Heart disease Mother   . Hypertension Father   . Cancer Paternal Grandmother     lung  . Heart disease Paternal Grandmother     Social History: History  Substance Use Topics  . Smoking status: Current Some Day Smoker -- 0.25 packs/day    Types: Cigarettes  . Smokeless tobacco: Never Used     Comment: has cut down  . Alcohol Use: Yes     Comment: occasionally-not now    Allergies:  Allergies  Allergen Reactions  . Cinnamon Anaphylaxis and Hives  . Shellfish Allergy Anaphylaxis  . Amoxicillin-Pot Clavulanate Nausea And Vomiting  . Codeine Nausea And Vomiting  .  Nickel Rash    Meds:  Prescriptions prior to admission  Medication Sig Dispense Refill  . acetaminophen (TYLENOL) 500 MG tablet Take 1 tablet (500 mg total) by mouth every 6 (six) hours as needed for pain.  30 tablet  0  . cephALEXin (KEFLEX) 500 MG capsule Take 1 capsule (500 mg total) by mouth 3 (three) times daily.  21 capsule  0  . ondansetron (ZOFRAN) 8 MG tablet Take 8 mg by mouth every 8 (eight) hours as needed for nausea.      . pantoprazole (PROTONIX) 40 MG tablet Take 40 mg by mouth daily.      Marland Kitchen terconazole (TERAZOL 3) 0.8 % vaginal cream Place 1 applicator vaginally daily as needed (for yeast infection).        ROS: Pertinent findings in history of present illness.  Physical Exam  Blood pressure 139/74, pulse 96, temperature 98.6 F (37 C), temperature source Oral, resp. rate 20, height 5\' 2"  (1.575 m), weight 84.188 kg (185 lb 9.6 oz), last menstrual period 05/13/2012. GENERAL: Well-developed, well-nourished female in no acute distress.  HEENT: normocephalic HEART: normal rate RESP: normal effort ABDOMEN: Soft, non-tender, gravid appropriate  for gestational age EXTREMITIES: Nontender, no edema NEURO: alert and oriented SPECULUM EXAM: Deferred     FHT:  Baseline 145, moderate variability, accelerations present, no decelerations Contractions: None on toco or to palpation   Labs: Results for orders placed during the hospital encounter of 10/18/12 (from the past 24 hour(s))  URINALYSIS, ROUTINE W REFLEX MICROSCOPIC     Status: Abnormal   Collection Time    10/18/12  7:40 PM      Result Value Range   Color, Urine YELLOW  YELLOW   APPearance CLEAR  CLEAR   Specific Gravity, Urine >1.030 (*) 1.005 - 1.030   pH 6.0  5.0 - 8.0   Glucose, UA NEGATIVE  NEGATIVE mg/dL   Hgb urine dipstick NEGATIVE  NEGATIVE   Bilirubin Urine NEGATIVE  NEGATIVE   Ketones, ur NEGATIVE  NEGATIVE mg/dL   Protein, ur NEGATIVE  NEGATIVE mg/dL   Urobilinogen, UA 0.2  0.0 - 1.0 mg/dL   Nitrite  NEGATIVE  NEGATIVE   Leukocytes, UA SMALL (*) NEGATIVE  URINE MICROSCOPIC-ADD ON     Status: Abnormal   Collection Time    10/18/12  7:40 PM      Result Value Range   Squamous Epithelial / LPF FEW (*) RARE   WBC, UA 7-10  <3 WBC/hpf   RBC / HPF 0-2  <3 RBC/hpf   Bacteria, UA FEW (*) RARE    Imaging:  US Ob Limited  10/18/2012   *RADIOLOGY REPORT*  Clinical Data:  Abdominal pain, [redacted] weeks pregnant  LIMITED OBSTETRIC ULTRASOUND  Number of Fetuses: 1 Heart Rate: 133bpm Movement:  Yes Presentation: Breech Placental Location: Right lateral Previa: Absent Amniotic Fluid (Subjective): Normal Vertical pocket:  4.7cm  BPD:  5.85 cm   24 w    0 d       EDC: 02/07/2013  MATERNAL FINDINGS: Cervix:  Closed, 3.75 cm length Uterus/Adnexae:  Normal appearance  IMPRESSION: Single live intrauterine gestation in breech presentation as above. No acute abnormalities identified.  This exam is performed on an emergent basis and does not comprehensively evaluate fetal size, dating, or anatomy, and a follow-up complete OB US should be considered if further fetal assessment is warranted.   Original Report Authenticated By: Ulyses Southward, M.D.   US Renal  10/18/2012   *RADIOLOGY REPORT*  Clinical Data: Abdominal pain, [redacted] weeks pregnant, question hydronephrosis  RENAL/URINARY TRACT ULTRASOUND COMPLETE  Comparison:  None.  Findings:  Right Kidney:  11.3 cm length. Normal cortical thickness and echogenicity.  Mild hydronephrosis present.  No mass or shadowing calcification.  No perinephric fluid.  Left Kidney:  10.4 cm length.  Normal cortical thickness and echogenicity.  No mass, hydronephrosis or shadowing calcification. No perinephric fluid.  Bladder:  Normally distended without mass.  Left ureteral jet visualized.  Right ureteral jet was not seen during 5 minutes of real time assessment.  IMPRESSION: Right hydronephrosis and absence of a right ureteral jet despite 5 minutes of real time surveillance; right ureteral obstruction  not excluded.   Original Report Authenticated By: Ulyses Southward, M.D.   ED Course Percocet 5/325 x2 tabs and Zofran 8 mg ODT given  Assessment: 1. Hydronephrosis, right     Plan: Discussed assessment and findings with Dr Ambrose Mantle Discharge home Percocet 5/325 x15 tabs F/U as planned with urology F/U with Dr Jackelyn Knife Return to MAU as needed  Sharen Counter Certified Nurse-Midwife 10/18/2012 8:21 PM

## 2012-10-18 NOTE — MAU Note (Signed)
Monitors D/C by NP.

## 2012-10-20 LAB — URINE CULTURE: Colony Count: 5000

## 2012-10-29 ENCOUNTER — Encounter (HOSPITAL_COMMUNITY): Payer: Self-pay | Admitting: Pharmacy Technician

## 2012-10-29 ENCOUNTER — Other Ambulatory Visit: Payer: Self-pay | Admitting: Urology

## 2012-10-29 ENCOUNTER — Other Ambulatory Visit: Payer: Self-pay | Admitting: Oncology

## 2012-10-29 ENCOUNTER — Other Ambulatory Visit: Payer: Self-pay | Admitting: Radiology

## 2012-10-29 DIAGNOSIS — N1330 Unspecified hydronephrosis: Secondary | ICD-10-CM

## 2012-10-30 ENCOUNTER — Ambulatory Visit (HOSPITAL_COMMUNITY)
Admission: RE | Admit: 2012-10-30 | Discharge: 2012-10-30 | Disposition: A | Discharge status: Home / Self Care | Payer: Medicaid Other | Source: Ambulatory Visit | Attending: Urology | Admitting: Urology

## 2012-10-30 ENCOUNTER — Other Ambulatory Visit: Payer: Self-pay | Admitting: Urology

## 2012-10-30 ENCOUNTER — Other Ambulatory Visit: Payer: Self-pay | Admitting: Radiology

## 2012-10-30 ENCOUNTER — Encounter (HOSPITAL_COMMUNITY): Payer: Self-pay

## 2012-10-30 DIAGNOSIS — O26839 Pregnancy related renal disease, unspecified trimester: Secondary | ICD-10-CM | POA: Insufficient documentation

## 2012-10-30 DIAGNOSIS — N1330 Unspecified hydronephrosis: Secondary | ICD-10-CM

## 2012-10-30 DIAGNOSIS — Z79899 Other long term (current) drug therapy: Secondary | ICD-10-CM | POA: Insufficient documentation

## 2012-10-30 LAB — BASIC METABOLIC PANEL
BUN: 6 mg/dL (ref 6–23)
CO2: 21 mEq/L (ref 19–32)
Calcium: 9.1 mg/dL (ref 8.4–10.5)
Creatinine, Ser: 0.51 mg/dL (ref 0.50–1.10)

## 2012-10-30 LAB — CBC
HCT: 34.9 % — ABNORMAL LOW (ref 36.0–46.0)
MCHC: 32.7 g/dL (ref 30.0–36.0)
MCV: 80 fL (ref 78.0–100.0)
Platelets: 300 10*3/uL (ref 150–400)
RDW: 13.5 % (ref 11.5–15.5)
WBC: 12.8 10*3/uL — ABNORMAL HIGH (ref 4.0–10.5)

## 2012-10-30 MED ORDER — CLINDAMYCIN PHOSPHATE 600 MG/50ML IV SOLN
600.0000 mg | Freq: Once | INTRAVENOUS | Status: AC
  Administered 2012-10-30: 600 mg via INTRAVENOUS
  Filled 2012-10-30: qty 50

## 2012-10-30 MED ORDER — LIDOCAINE HCL 1 % IJ SOLN
INTRAMUSCULAR | Status: AC
  Filled 2012-10-30: qty 20

## 2012-10-30 MED ORDER — MIDAZOLAM HCL 2 MG/2ML IJ SOLN
INTRAMUSCULAR | Status: AC | PRN
  Administered 2012-10-30 (×9): 1 mg via INTRAVENOUS

## 2012-10-30 MED ORDER — FENTANYL CITRATE 0.05 MG/ML IJ SOLN
INTRAMUSCULAR | Status: AC | PRN
  Administered 2012-10-30 (×9): 50 ug via INTRAVENOUS

## 2012-10-30 MED ORDER — MIDAZOLAM HCL 2 MG/2ML IJ SOLN
INTRAMUSCULAR | Status: AC
  Filled 2012-10-30: qty 6

## 2012-10-30 MED ORDER — IOHEXOL 300 MG/ML  SOLN: 50.0000 mL | Freq: Once | INTRAMUSCULAR | Status: AC | PRN

## 2012-10-30 MED ORDER — DEXTROSE 5 % IV SOLN: 600.0000 mg | Freq: Once | INTRAVENOUS | Status: DC

## 2012-10-30 MED ORDER — SODIUM CHLORIDE 0.9 % IV SOLN
Freq: Once | INTRAVENOUS | Status: AC
  Administered 2012-10-30: 12:00:00 via INTRAVENOUS

## 2012-10-30 MED ORDER — FENTANYL CITRATE 0.05 MG/ML IJ SOLN
INTRAMUSCULAR | Status: AC
  Filled 2012-10-30: qty 6

## 2012-10-30 MED ORDER — SODIUM CHLORIDE 0.9 % IV SOLN: 1.0000 g | Freq: Four times a day (QID) | INTRAVENOUS | Status: DC

## 2012-10-30 MED ORDER — MIDAZOLAM HCL 2 MG/2ML IJ SOLN
INTRAMUSCULAR | Status: AC
  Filled 2012-10-30: qty 4

## 2012-10-30 MED ORDER — ONDANSETRON HCL 4 MG/2ML IJ SOLN
4.0000 mg | INTRAMUSCULAR | Status: AC
  Administered 2012-10-30: 4 mg via INTRAVENOUS
  Filled 2012-10-30: qty 2

## 2012-10-30 MED ORDER — HYDROCODONE-ACETAMINOPHEN 5-325 MG PO TABS
1.0000 | ORAL_TABLET | ORAL | Status: DC | PRN
  Administered 2012-10-30 (×2): 1 via ORAL
  Filled 2012-10-30 (×2): qty 1

## 2012-10-30 MED ORDER — FENTANYL CITRATE 0.05 MG/ML IJ SOLN
INTRAMUSCULAR | Status: AC
  Filled 2012-10-30: qty 4

## 2012-10-30 NOTE — Progress Notes (Addendum)
1130 Received call about this patient having nephrostomy tube placed @ [facility] short stay.  Procedure scheduled for 1:30.  RR OB  RN to arrive around 12:30 pm.  1218 Received telephone orders from Dr. Ambrose Mantle for EFM 10 min before and after procedure.  1230 Arrived to [facility], retrieved portable monitor. 1245  EFM started, see OB RR documentation.  1550 Patient returned from procedure. EFM for 10 min, see OB RR documentation.

## 2012-10-30 NOTE — H&P (Signed)
[name] [name] is an 22 y.o. female.   Chief Complaint: right flank pain HPI: 21yo WF , [redacted] weeks pregnant,  with history of persistent right flank pain and evidence of right hydronephrosis on recent US presents today for right percutaneous nephrostomy for decompression.   Past Medical History  Diagnosis Date  . Anxiety   . Bipolar 1 disorder   . Post traumatic stress disorder (PTSD)   . Chronic pain     muscle spasms- entire body  . Infection     UTI  . Fracture, ankle     left- x2  . Depression     doing fine now    Past Surgical History  Procedure Laterality Date  . No past surgeries      Family History  Problem Relation Age of Onset  . Heart disease Mother   . Hypertension Father   . Cancer Paternal Grandmother     lung  . Heart disease Paternal Grandmother    Social History:  reports that she has been smoking Cigarettes.  She has been smoking about 0.25 packs per day. She has never used smokeless tobacco. She reports that  drinks alcohol. She reports that she does not use illicit drugs.  Allergies:  Allergies  Allergen Reactions  . Cinnamon Anaphylaxis and Hives  . Shellfish Allergy Anaphylaxis  . Amoxicillin-Pot Clavulanate Nausea And Vomiting  . Nickel Rash    Current outpatient prescriptions:ranitidine (ZANTAC) 75 MG tablet, Take 75 mg by mouth daily., Disp: , Rfl: ;  ondansetron (ZOFRAN) 8 MG tablet, Take 8 mg by mouth every 8 (eight) hours as needed for nausea., Disp: , Rfl: ;  oxyCODONE-acetaminophen (PERCOCET/ROXICET) 5-325 MG per tablet, Take 1-2 tablets by mouth every 6 (six) hours as needed for pain., Disp: 15 tablet, Rfl: 0 pantoprazole (PROTONIX) 40 MG tablet, Take 40 mg by mouth daily., Disp: , Rfl: ;  terconazole (TERAZOL 3) 0.8 % vaginal cream, Place 1 applicator vaginally daily as needed (for yeast infection)., Disp: , Rfl:  Current facility-administered medications:clindamycin (CLEOCIN) IVPB 600 mg, 600 mg, Intravenous, Once, D Jeananne Rama,  PA-C   Results for orders placed during the hospital encounter of 10/30/12 (from the past 48 hour(s))  BASIC METABOLIC PANEL     Status: None   Collection Time    10/30/12 11:50 AM      Result Value Range   Sodium 138  135 - 145 mEq/L   Potassium 3.9  3.5 - 5.1 mEq/L   Chloride 105  96 - 112 mEq/L   CO2 21  19 - 32 mEq/L   Glucose, Bld 82  70 - 99 mg/dL   BUN 6  6 - 23 mg/dL   Creatinine, Ser 5.28  0.50 - 1.10 mg/dL   Calcium 9.1  8.4 - 41.3 mg/dL   GFR calc non Af Amer >90  >90 mL/min   GFR calc Af Amer >90  >90 mL/min   Comment:            The eGFR has been calculated     using the CKD EPI equation.     This calculation has not been     validated in all clinical     situations.     eGFR's persistently     <90 mL/min signify     possible Chronic Kidney Disease.  CBC     Status: Abnormal   Collection Time    10/30/12 11:50 AM      Result Value Range  WBC 12.8 (*) 4.0 - 10.5 K/uL   RBC 4.36  3.87 - 5.11 MIL/uL   Hemoglobin 11.4 (*) 12.0 - 15.0 g/dL   HCT 16.1 (*) 09.6 - 04.5 %   MCV 80.0  78.0 - 100.0 fL   MCH 26.1  26.0 - 34.0 pg   MCHC 32.7  30.0 - 36.0 g/dL   RDW 40.9  81.1 - 91.4 %   Platelets 300  150 - 400 K/uL  PROTIME-INR     Status: None   Collection Time    10/30/12 11:50 AM      Result Value Range   Prothrombin Time 12.8  11.6 - 15.2 seconds   INR 0.98  0.00 - 1.49   No results found.  Review of Systems  Constitutional: Negative for fever and chills.  Respiratory: Negative for cough and shortness of breath.   Cardiovascular: Negative for chest pain.  Gastrointestinal: Positive for nausea and vomiting. Negative for abdominal pain.  Genitourinary: Positive for urgency, frequency and flank pain. Negative for dysuria and hematuria.  Musculoskeletal: Positive for back pain.  Neurological: Negative for headaches.  Endo/Heme/Allergies: Does not bruise/bleed easily.  Psychiatric/Behavioral: The patient is nervous/anxious.     Blood pressure 151/96,  pulse 104, temperature 98.4 F (36.9 C), temperature source Oral, resp. rate 18, last menstrual period 05/13/2012, SpO2 96.00%. Physical Exam  Constitutional: She is oriented to person, place, and time. She appears well-developed and well-nourished.  Cardiovascular:  Sl tachy but reg rhythm  Respiratory: Effort normal and breath sounds normal.  GI: Soft. Bowel sounds are normal. There is no tenderness.  Gravid uterus  Musculoskeletal: Normal range of motion.  Trace LE edema  Neurological: She is alert and oriented to person, place, and time.     Assessment/Plan 22 yo WF , [redacted] weeks pregnant, with persistent right flank pain and right hydronephrosis, most likely from uterine compression. Plan is for a right percutaneous nephrostomy today for decompression. Details/risks of procedure d/w pt/family with their understanding and consent.  Emerita Berkemeier,D KEVIN 10/30/2012, 12:52 PM

## 2012-10-30 NOTE — Procedures (Signed)
Successful placement of right sided 10 Fr PCN for symptomatic hydronephrosis.  No immediate complications.

## 2012-11-04 ENCOUNTER — Telehealth: Payer: Self-pay | Admitting: Emergency Medicine

## 2012-11-04 NOTE — Telephone Encounter (Signed)
PT CALLED W/ CONCERNS ABOUT DRAIN. NEEDS INSTRUCTIONS  PAGED Deirdre Evener, PA-- HE WILL CONTACT PT

## 2012-11-14 ENCOUNTER — Other Ambulatory Visit: Payer: Self-pay | Admitting: Urology

## 2012-11-14 DIAGNOSIS — N1330 Unspecified hydronephrosis: Secondary | ICD-10-CM

## 2012-11-20 ENCOUNTER — Other Ambulatory Visit: Payer: Self-pay | Admitting: Urology

## 2012-11-20 ENCOUNTER — Ambulatory Visit (HOSPITAL_COMMUNITY)
Admission: RE | Admit: 2012-11-20 | Discharge: 2012-11-20 | Disposition: A | Discharge status: Home / Self Care | Payer: Medicaid Other | Source: Ambulatory Visit | Attending: Urology | Admitting: Urology

## 2012-11-20 DIAGNOSIS — N1330 Unspecified hydronephrosis: Secondary | ICD-10-CM

## 2012-11-20 MED ORDER — IOHEXOL 300 MG/ML  SOLN: 20.0000 mL | Freq: Once | INTRAMUSCULAR | Status: AC | PRN

## 2012-11-20 NOTE — Progress Notes (Signed)
Patient ID: [name] [name], female   DOB: Oct 24, 1990, 22 y.o.   MRN: [id number] [name] is being evaluated for pain at the nephrostomy site. She denies fever or abdominal pain. The drain has been functioning well.  Upon examination, there is clear urine with minimal debris. No cloudiness or hematuria. She  Is in no distress. No CVA tenderness. The nephrostomy site is clean and dry without signs of infection. It iis easily flushable with saline. Urine returns easily with aspiration.  Bedside US demonstrates no evidence of hydronephrosis.  The nephrostomy is functioning well. She is to return next week for an exchange.

## 2012-11-27 ENCOUNTER — Other Ambulatory Visit (HOSPITAL_COMMUNITY): Payer: Medicaid Other

## 2012-11-27 ENCOUNTER — Other Ambulatory Visit: Payer: Self-pay | Admitting: Urology

## 2012-11-27 ENCOUNTER — Ambulatory Visit (HOSPITAL_COMMUNITY)
Admission: RE | Admit: 2012-11-27 | Discharge: 2012-11-27 | Disposition: A | Discharge status: Home / Self Care | Payer: Medicaid Other | Source: Ambulatory Visit | Attending: Urology | Admitting: Urology

## 2012-11-27 DIAGNOSIS — N135 Crossing vessel and stricture of ureter without hydronephrosis: Secondary | ICD-10-CM | POA: Insufficient documentation

## 2012-11-27 DIAGNOSIS — Z436 Encounter for attention to other artificial openings of urinary tract: Secondary | ICD-10-CM | POA: Insufficient documentation

## 2012-11-27 DIAGNOSIS — N1330 Unspecified hydronephrosis: Secondary | ICD-10-CM | POA: Insufficient documentation

## 2012-11-27 MED ORDER — HYDROCODONE-ACETAMINOPHEN 5-325 MG PO TABS
1.0000 | ORAL_TABLET | ORAL | Status: DC | PRN
  Administered 2012-11-27: 2 via ORAL
  Filled 2012-11-27: qty 2

## 2012-11-27 MED ORDER — LIDOCAINE HCL 1 % IJ SOLN
INTRAMUSCULAR | Status: DC
Start: 2012-11-27 — End: 2012-11-28
  Filled 2012-11-27: qty 20

## 2012-11-27 MED ORDER — IOHEXOL 300 MG/ML  SOLN
10.0000 mL | Freq: Once | INTRAMUSCULAR | Status: AC | PRN
  Administered 2012-11-27: 10 mL

## 2012-11-27 NOTE — Procedures (Signed)
R PCN exchange No comp

## 2012-11-28 ENCOUNTER — Other Ambulatory Visit (HOSPITAL_COMMUNITY): Payer: Medicaid Other

## 2012-12-10 ENCOUNTER — Telehealth (HOSPITAL_COMMUNITY): Payer: Self-pay | Admitting: Radiology

## 2012-12-10 NOTE — Telephone Encounter (Signed)
Patient called to reschedule Rt pcn exchange appointment on 12-18-12 @ 11:30a.  Patient wishes to come in on 12-19-12 at 2:00p.  I have rescheduled the appointment.

## 2012-12-16 DIAGNOSIS — Z9289 Personal history of other medical treatment: Secondary | ICD-10-CM

## 2012-12-16 HISTORY — DX: Personal history of other medical treatment: Z92.89

## 2012-12-18 ENCOUNTER — Other Ambulatory Visit (HOSPITAL_COMMUNITY): Payer: Medicaid Other

## 2012-12-19 ENCOUNTER — Ambulatory Visit (HOSPITAL_COMMUNITY)
Admission: RE | Admit: 2012-12-19 | Discharge: 2012-12-19 | Disposition: A | Discharge status: Home / Self Care | Payer: Medicaid Other | Source: Ambulatory Visit | Attending: Urology | Admitting: Urology

## 2012-12-19 DIAGNOSIS — N1330 Unspecified hydronephrosis: Secondary | ICD-10-CM

## 2012-12-24 ENCOUNTER — Ambulatory Visit (HOSPITAL_COMMUNITY)
Admission: RE | Admit: 2012-12-24 | Discharge: 2012-12-24 | Disposition: A | Discharge status: Home / Self Care | Payer: Medicaid Other | Source: Ambulatory Visit | Attending: Interventional Radiology | Admitting: Interventional Radiology

## 2012-12-24 ENCOUNTER — Inpatient Hospital Stay (HOSPITAL_COMMUNITY)
Admission: AD | Admit: 2012-12-24 | Discharge: 2012-12-27 | DRG: 781 | Disposition: A | Discharge status: Home / Self Care | Payer: Medicaid Other | Source: Ambulatory Visit | Attending: Obstetrics and Gynecology | Admitting: Obstetrics and Gynecology

## 2012-12-24 ENCOUNTER — Other Ambulatory Visit (HOSPITAL_COMMUNITY): Payer: Self-pay | Admitting: Interventional Radiology

## 2012-12-24 ENCOUNTER — Encounter (HOSPITAL_COMMUNITY): Payer: Self-pay

## 2012-12-24 ENCOUNTER — Encounter (HOSPITAL_COMMUNITY): Payer: Self-pay | Admitting: *Deleted

## 2012-12-24 ENCOUNTER — Other Ambulatory Visit: Payer: Self-pay | Admitting: Radiology

## 2012-12-24 ENCOUNTER — Telehealth (HOSPITAL_COMMUNITY): Payer: Self-pay | Admitting: Radiology

## 2012-12-24 VITALS — BP 0/0 | HR 104 | Temp 98.2°F | Resp 20

## 2012-12-24 DIAGNOSIS — O2343 Unspecified infection of urinary tract in pregnancy, third trimester: Secondary | ICD-10-CM

## 2012-12-24 DIAGNOSIS — N390 Urinary tract infection, site not specified: Secondary | ICD-10-CM | POA: Diagnosis present

## 2012-12-24 DIAGNOSIS — N3289 Other specified disorders of bladder: Secondary | ICD-10-CM | POA: Diagnosis present

## 2012-12-24 DIAGNOSIS — N1330 Unspecified hydronephrosis: Secondary | ICD-10-CM | POA: Diagnosis present

## 2012-12-24 DIAGNOSIS — R000 Tachycardia, unspecified: Secondary | ICD-10-CM | POA: Diagnosis present

## 2012-12-24 DIAGNOSIS — O2390 Unspecified genitourinary tract infection in pregnancy, unspecified trimester: Secondary | ICD-10-CM | POA: Diagnosis present

## 2012-12-24 DIAGNOSIS — O26839 Pregnancy related renal disease, unspecified trimester: Principal | ICD-10-CM | POA: Diagnosis present

## 2012-12-24 DIAGNOSIS — N944 Primary dysmenorrhea: Secondary | ICD-10-CM

## 2012-12-24 DIAGNOSIS — R109 Unspecified abdominal pain: Secondary | ICD-10-CM | POA: Diagnosis present

## 2012-12-24 LAB — COMPREHENSIVE METABOLIC PANEL
ALT: 5 U/L (ref 0–35)
Alkaline Phosphatase: 190 U/L — ABNORMAL HIGH (ref 39–117)
BUN: 6 mg/dL (ref 6–23)
Chloride: 104 mEq/L (ref 96–112)
GFR calc Af Amer: >90 mL/min (ref >90)
Glucose, Bld: 100 mg/dL — ABNORMAL HIGH (ref 70–99)
Potassium: 4 mEq/L (ref 3.5–5.1)
Sodium: 137 mEq/L (ref 135–145)
Total Bilirubin: 0.2 mg/dL — ABNORMAL LOW (ref 0.3–1.2)

## 2012-12-24 LAB — CBC WITH DIFFERENTIAL/PLATELET
Hemoglobin: 10.7 g/dL — ABNORMAL LOW (ref 12.0–15.0)
Lymphocytes Relative: 7 % — ABNORMAL LOW (ref 12–46)
Lymphs Abs: 1.9 10*3/uL (ref 0.7–4.0)
Monocytes Relative: 6 % (ref 3–12)
Neutro Abs: 23.9 10*3/uL — ABNORMAL HIGH (ref 1.7–7.7)
Neutrophils Relative %: 87 % — ABNORMAL HIGH (ref 43–77)
RBC: 4.33 MIL/uL (ref 3.87–5.11)
WBC: 27.5 10*3/uL — ABNORMAL HIGH (ref 4.0–10.5)

## 2012-12-24 MED ORDER — SODIUM CHLORIDE 0.9 % IJ SOLN: 9.0000 mL | INTRAMUSCULAR | Status: DC | PRN

## 2012-12-24 MED ORDER — ONDANSETRON HCL 4 MG/2ML IJ SOLN
4.0000 mg | Freq: Four times a day (QID) | INTRAMUSCULAR | Status: DC | PRN
  Administered 2012-12-24 – 2012-12-25 (×2): 4 mg via INTRAVENOUS
  Filled 2012-12-24 (×3): qty 2

## 2012-12-24 MED ORDER — MIDAZOLAM HCL 2 MG/2ML IJ SOLN
INTRAMUSCULAR | Status: AC
  Filled 2012-12-24: qty 6

## 2012-12-24 MED ORDER — PRENATAL MULTIVITAMIN CH
1.0000 | ORAL_TABLET | Freq: Every day | ORAL | Status: DC
  Filled 2012-12-24: qty 1

## 2012-12-24 MED ORDER — CLINDAMYCIN PHOSPHATE 600 MG/50ML IV SOLN
600.0000 mg | Freq: Once | INTRAVENOUS | Status: AC
  Administered 2012-12-24: 600 mg via INTRAVENOUS
  Filled 2012-12-24: qty 50

## 2012-12-24 MED ORDER — HYDROMORPHONE HCL PF 1 MG/ML IJ SOLN
1.0000 mg | INTRAMUSCULAR | Status: DC | PRN
  Administered 2012-12-24 – 2012-12-25 (×4): 1 mg via INTRAMUSCULAR
  Administered 2012-12-25: 0.5 mg via INTRAMUSCULAR
  Administered 2012-12-26 – 2012-12-27 (×4): 1 mg via INTRAMUSCULAR
  Filled 2012-12-24 (×8): qty 1

## 2012-12-24 MED ORDER — DOCUSATE SODIUM 100 MG PO CAPS
100.0000 mg | ORAL_CAPSULE | Freq: Every day | ORAL | Status: DC
  Administered 2012-12-26 – 2012-12-27 (×2): 100 mg via ORAL
  Filled 2012-12-24 (×2): qty 1

## 2012-12-24 MED ORDER — IOHEXOL 300 MG/ML  SOLN
20.0000 mL | Freq: Once | INTRAMUSCULAR | Status: AC | PRN
  Administered 2012-12-24: 20 mL

## 2012-12-24 MED ORDER — LIDOCAINE HCL 1 % IJ SOLN
INTRAMUSCULAR | Status: AC
  Filled 2012-12-24: qty 20

## 2012-12-24 MED ORDER — ACETAMINOPHEN 500 MG PO TABS
500.0000 mg | ORAL_TABLET | Freq: Four times a day (QID) | ORAL | Status: DC | PRN
  Administered 2012-12-24: 500 mg via ORAL
  Filled 2012-12-24: qty 1

## 2012-12-24 MED ORDER — ACETAMINOPHEN 325 MG PO TABS: 650.0000 mg | ORAL_TABLET | ORAL | Status: DC | PRN

## 2012-12-24 MED ORDER — DIPHENHYDRAMINE HCL 50 MG/ML IJ SOLN
12.5000 mg | Freq: Four times a day (QID) | INTRAMUSCULAR | Status: DC | PRN
  Administered 2012-12-25: 12.5 mg via INTRAVENOUS
  Filled 2012-12-24: qty 1

## 2012-12-24 MED ORDER — LACTATED RINGERS IV SOLN
INTRAVENOUS | Status: DC
  Administered 2012-12-24 – 2012-12-25 (×2): via INTRAVENOUS

## 2012-12-24 MED ORDER — CALCIUM CARBONATE ANTACID 500 MG PO CHEW
2.0000 | CHEWABLE_TABLET | ORAL | Status: DC | PRN
  Administered 2012-12-25: 400 mg via ORAL
  Filled 2012-12-24: qty 2

## 2012-12-24 MED ORDER — MIDAZOLAM HCL 2 MG/2ML IJ SOLN
INTRAMUSCULAR | Status: AC | PRN
  Administered 2012-12-24: 1 mg via INTRAVENOUS

## 2012-12-24 MED ORDER — DIPHENHYDRAMINE HCL 12.5 MG/5ML PO ELIX
12.5000 mg | ORAL_SOLUTION | Freq: Four times a day (QID) | ORAL | Status: DC | PRN
  Filled 2012-12-24: qty 5

## 2012-12-24 MED ORDER — SODIUM CHLORIDE 0.9 % IV SOLN
Freq: Once | INTRAVENOUS | Status: AC
  Administered 2012-12-24: 14:00:00 via INTRAVENOUS

## 2012-12-24 MED ORDER — FENTANYL CITRATE 0.05 MG/ML IJ SOLN
INTRAMUSCULAR | Status: AC | PRN
  Administered 2012-12-24: 100 ug via INTRAVENOUS

## 2012-12-24 MED ORDER — ZOLPIDEM TARTRATE 5 MG PO TABS: 5.0000 mg | ORAL_TABLET | Freq: Every evening | ORAL | Status: DC | PRN

## 2012-12-24 MED ORDER — HYDROMORPHONE 0.3 MG/ML IV SOLN
INTRAVENOUS | Status: DC
  Administered 2012-12-24: 21:00:00 via INTRAVENOUS
  Administered 2012-12-25: 0.799 mg via INTRAVENOUS
  Administered 2012-12-25: 2.79 mg via INTRAVENOUS
  Administered 2012-12-25: 2.59 mg via INTRAVENOUS
  Administered 2012-12-25: 04:00:00 via INTRAVENOUS
  Administered 2012-12-25: 4.19 mg via INTRAVENOUS
  Administered 2012-12-25: 3.39 mg via INTRAVENOUS
  Administered 2012-12-25 (×2): via INTRAVENOUS
  Administered 2012-12-25: 3.29 mg via INTRAVENOUS
  Administered 2012-12-26: 4.19 mg via INTRAVENOUS
  Administered 2012-12-26: 4.98 mg via INTRAVENOUS
  Administered 2012-12-26: 10:00:00 via INTRAVENOUS
  Administered 2012-12-26: 2.59 mg via INTRAVENOUS
  Administered 2012-12-26: 2.99 mg via INTRAVENOUS
  Filled 2012-12-24 (×5): qty 25

## 2012-12-24 MED ORDER — ACETAMINOPHEN 500 MG PO TABS: 500.0000 mg | ORAL_TABLET | Freq: Four times a day (QID) | ORAL | Status: DC | PRN

## 2012-12-24 MED ORDER — DEXTROSE 5 % IV SOLN
1.0000 g | Freq: Once | INTRAVENOUS | Status: AC
  Administered 2012-12-24: 1 g via INTRAVENOUS
  Filled 2012-12-24: qty 10

## 2012-12-24 MED ORDER — NALOXONE HCL 0.4 MG/ML IJ SOLN: 0.4000 mg | INTRAMUSCULAR | Status: DC | PRN

## 2012-12-24 MED ORDER — FENTANYL CITRATE 0.05 MG/ML IJ SOLN
INTRAMUSCULAR | Status: AC
  Filled 2012-12-24: qty 6

## 2012-12-24 NOTE — H&P (Signed)
[name] [name] is a 22 y.o. female, G1 P0, EGA [redacted]W[redacted]D with EDC 10-23 presenting for pain control.  Pt has had right nephrostomy tube for hydronephrosis since 25 weeks.  She had an attempt at replacing her nephrostomy tube today.  The tube was unable to be replaced, there was extravasation of contrast, she is having significant pain, and she will need a ureteral stent.  Prenatal care has been otherwise essentially uncomplicated, see prenatal records for complete history.  Maternal Medical History:  Fetal activity: Perceived fetal activity is normal.      OB History   Grav Para Term Preterm Abortions TAB SAB Ect Mult Living   1 0 0 0 0 0 0 0 0 0      Past Medical History  Diagnosis Date  . Anxiety   . Bipolar 1 disorder   . Post traumatic stress disorder (PTSD)   . Chronic pain     muscle spasms- entire body  . Infection     UTI  . Fracture, ankle     left- x2  . Depression     doing fine now   Past Surgical History  Procedure Laterality Date  . No past surgeries    . Nephrostomy tube placement at 25wks and removed at 335/7 Right    Family History: family history includes Cancer in her paternal grandmother; Heart disease in her mother and paternal grandmother; Hypertension in her father. Social History:  reports that she has been smoking Cigarettes.  She has been smoking about 0.25 packs per day. She has never used smokeless tobacco. She reports that  drinks alcohol. She reports that she does not use illicit drugs.  Review of Systems  Respiratory: Negative.   Cardiovascular: Positive for chest pain.      Blood pressure 140/85, pulse 131, temperature 98.1 F (36.7 C), temperature source Oral, resp. rate 42, height 5\' 2"  (1.575 m), weight 82.555 kg (182 lb), last menstrual period 05/13/2012. Maternal Exam:  Abdomen: Patient reports no abdominal tenderness.   Fetal Exam Fetal Monitor Review: Mode: ultrasound.   Variability: moderate (6-25 bpm).   Pattern: accelerations present  and no decelerations.    Fetal State Assessment: Category I - tracings are normal.     Physical Exam  Constitutional: She appears well-developed and well-nourished.  Cardiovascular: Normal rate, regular rhythm and normal heart sounds.   No murmur heard. Respiratory: Effort normal and breath sounds normal. No respiratory distress. She has no wheezes.  GI:  gravid    Prenatal labs: ABO, Rh:  AB pos Antibody:  neg Rubella:  Imm RPR:   NR HBsAg:   Neg HIV:   NR GBS:   GCT:   117  Assessment/Plan: IUP at 33+ weeks with pain from attempted replacement of nephrostomy tube.  I have spoken with Dr. Retta Diones, will admit for pain control with PCA, give IV Rocephin, he will see her in am and assess for ureteral stent.     Gionni Freese D 12/24/2012, 7:12 PM

## 2012-12-24 NOTE — ED Notes (Signed)
Pt returned to IR room.  Dr. Miles Costain to review CXR and discuss plan of care with OB and Urology.

## 2012-12-24 NOTE — ED Notes (Signed)
OB RR RN at bedside assessing fetal heart tones.

## 2012-12-24 NOTE — Progress Notes (Signed)
Antenatal updated on pt and transfer for direct admit to unit. Report given to Marrion Coy, RNC.

## 2012-12-24 NOTE — Progress Notes (Signed)
Dr. Jackelyn Knife clarified order for pre and post op monitoring.

## 2012-12-24 NOTE — Progress Notes (Signed)
Patient c/o severe chest and shoulder pain that is "getting worse"; rating 10/10; PCA ordered but attempts to start IV have not been successful at this time; House coverage notified and will come up to attempt to start IV; Dr. Jackelyn Knife notified of patient's increased pain and request for IM pain medication order until we are able to get PCA started.

## 2012-12-24 NOTE — ED Notes (Signed)
Pt taken for STAT PA/LAT CXR.

## 2012-12-24 NOTE — Progress Notes (Signed)
Kenney Houseman, RN house coverage at bedside to attempt IV

## 2012-12-24 NOTE — Telephone Encounter (Signed)
Rec'd phone call from Dr. Retta Diones.  Recover patient post sedation then instruct patient to go to Maternity Admissions Unit at [facility].  Dr. Jackelyn Knife will admit patient to [facility] for observation.

## 2012-12-24 NOTE — ED Notes (Signed)
Pt with acute onset chest pain 10/10, tight, pressure.  Dr. Miles Costain aware. VS WNL.

## 2012-12-24 NOTE — Progress Notes (Signed)
At pt bedside. Abd soft and non tender. Pt reports positive fetal movement, no leakage of fluid, no vaginal bleeding and no contractions.

## 2012-12-24 NOTE — H&P (Signed)
[name] [name] is an 22 y.o. female.   Chief Complaint: right flank pain, minimal output from right nephrostomy tube HPI: Patient is a 22 yo WF , [redacted] weeks pregnant, with history of right hydronephrosis and right PCN performed 10/30/2012. She presents today for right nephrostogram with possible PCN exchange secondary to persistent right flank pain and minimal output from PCN.  Past Medical History  Diagnosis Date  . Anxiety   . Bipolar 1 disorder   . Post traumatic stress disorder (PTSD)   . Chronic pain     muscle spasms- entire body  . Infection     UTI  . Fracture, ankle     left- x2  . Depression     doing fine now    Past Surgical History  Procedure Laterality Date  . No past surgeries      Family History  Problem Relation Age of Onset  . Heart disease Mother   . Hypertension Father   . Cancer Paternal Grandmother     lung  . Heart disease Paternal Grandmother    Social History:  reports that she has been smoking Cigarettes.  She has been smoking about 0.25 packs per day. She has never used smokeless tobacco. She reports that  drinks alcohol. She reports that she does not use illicit drugs.  Allergies:  Allergies  Allergen Reactions  . Cinnamon Anaphylaxis and Hives  . Shellfish Allergy Anaphylaxis  . Amoxicillin-Pot Clavulanate Nausea And Vomiting  . Nickel Rash    Current outpatient prescriptions:oxyCODONE-acetaminophen (PERCOCET/ROXICET) 5-325 MG per tablet, Take 1-2 tablets by mouth every 6 (six) hours as needed for pain., Disp: 15 tablet, Rfl: 0;  pantoprazole (PROTONIX) 40 MG tablet, Take 40 mg by mouth daily., Disp: , Rfl: ;  ranitidine (ZANTAC) 75 MG tablet, Take 75 mg by mouth daily., Disp: , Rfl:  terconazole (TERAZOL 3) 0.8 % vaginal cream, Place 1 applicator vaginally daily as needed (for yeast infection)., Disp: , Rfl: ;  ondansetron (ZOFRAN) 8 MG tablet, Take 8 mg by mouth every 8 (eight) hours as needed for nausea., Disp: , Rfl:  Current  facility-administered medications:fentaNYL (SUBLIMAZE) 0.05 MG/ML injection, , , , ;  midazolam (VERSED) 2 MG/2ML injection, , , ,   No results found for this or any previous visit (from the past 48 hour(s)). No results found.  Review of Systems  Constitutional: Positive for chills. Negative for fever.  Respiratory: Negative for cough and shortness of breath.   Cardiovascular: Negative for chest pain.  Gastrointestinal: Positive for nausea. Negative for vomiting, abdominal pain and blood in stool.  Genitourinary: Positive for flank pain. Negative for dysuria and hematuria.  Musculoskeletal: Positive for back pain.  Neurological: Positive for headaches.  Endo/Heme/Allergies: Does not bruise/bleed easily.    Blood pressure 136/81, pulse 86, temperature 98.2 F (36.8 C), temperature source Oral, resp. rate 18, last menstrual period 05/13/2012, SpO2 99.00%. Physical Exam  Constitutional: She is oriented to person, place, and time. She appears well-developed and well-nourished.  Cardiovascular: Normal rate and regular rhythm.   Respiratory: Effort normal and breath sounds normal.  GI: Bowel sounds are normal. There is no tenderness.  Gravid uterus  33 weeks; nl fetal heart tones per OB nurse; intact rt PCN draining small amount of light yellow urine with debris  Musculoskeletal: Normal range of motion. She exhibits edema.  Neurological: She is alert and oriented to person, place, and time.     Assessment/Plan Pt with pregnancy induced right hydronephrosis, s/p rt PCN 10/30/2012.  Now with persistent rt flank pain, nausea and minimal output from PCN. Plan is for right nephrostogram with possible PCN exchange today via IV conscious sedation. Pt's OB/GYN has given consent for sedation and OB nurse is currently present for fetal monitoring.  Details of above d/w pt/father with their understanding and consent.  [name] [name],D KEVIN 12/24/2012, 2:05 PM

## 2012-12-24 NOTE — Progress Notes (Signed)
Dr. Jackelyn Knife contacted regarding pt transfer to [facility] hospital for observation. Stated pt may arrive via private transportation to MAU and direct admit to Antenatal unit. Stated he would be by to see pt at [facility]

## 2012-12-24 NOTE — Progress Notes (Signed)
[facility] short stay called regarding pt who is having a conscious sedation procedure for nephrostomy tube replacement. OB RR RN in route for pre and post op monitoring.

## 2012-12-24 NOTE — Procedures (Signed)
Occluded encrusted right nephrostomy removed Tract injection shows contrast leakage across diaphragm into the right chest This resulted in chest pain.  Suspect the neph tube may have traversed the diaphragm.  cxr neg for ptx   Findings d/w Dahlstedt.  Plan for observation at [facility] overnight and rt ureteral stent tomorrow since the neph tube has been difficult to maintain since placement

## 2012-12-24 NOTE — Progress Notes (Signed)
Pt approximately [redacted] weeks pregnant for routine pcn exchange requiring sedation due to severe pain and anxiety.  Nephrostomy tubes working well and with no problems.  She is currently flushing the catheter daily with no problems.  Dr Lowella Dandy reviewed history and Jeananne Rama, P.A. examined tube and site.  Patient states she is to see her urologist next week, Since she is having no problems with the current tube and is pregnant requiring sedation, Dr Lowella Dandy recommends not doing a "routine" exchange at this point and to wait until she sees the urologist to see if she can wait to exchange.  She agrees and seems to understand the plan.  She will call if she has any problems.

## 2012-12-25 ENCOUNTER — Other Ambulatory Visit: Payer: Self-pay | Admitting: Urology

## 2012-12-25 ENCOUNTER — Other Ambulatory Visit (HOSPITAL_COMMUNITY): Payer: Medicaid Other

## 2012-12-25 ENCOUNTER — Encounter (HOSPITAL_COMMUNITY): Payer: Self-pay | Admitting: Anesthesiology

## 2012-12-25 ENCOUNTER — Observation Stay (HOSPITAL_COMMUNITY): Payer: Medicaid Other | Admitting: Anesthesiology

## 2012-12-25 ENCOUNTER — Encounter (HOSPITAL_COMMUNITY)
Admission: AD | Disposition: A | Discharge status: Home / Self Care | Payer: Self-pay | Source: Ambulatory Visit | Attending: Obstetrics and Gynecology

## 2012-12-25 HISTORY — PX: CYSTOSCOPY WITH STENT PLACEMENT: SHX5790

## 2012-12-25 LAB — TYPE AND SCREEN: ABO/RH(D): AB POS

## 2012-12-25 SURGERY — CYSTOSCOPY, WITH STENT INSERTION
Anesthesia: Spinal | Site: Ureter | Laterality: Right | Wound class: Clean Contaminated

## 2012-12-25 MED ORDER — HYDROMORPHONE HCL PF 1 MG/ML IJ SOLN
0.2500 mg | INTRAMUSCULAR | Status: DC | PRN
  Administered 2012-12-25 (×3): 0.5 mg via INTRAVENOUS

## 2012-12-25 MED ORDER — PROMETHAZINE HCL 25 MG/ML IJ SOLN: 6.2500 mg | INTRAMUSCULAR | Status: DC | PRN

## 2012-12-25 MED ORDER — FENTANYL CITRATE 0.05 MG/ML IJ SOLN
INTRAMUSCULAR | Status: DC | PRN
  Administered 2012-12-25 (×10): 50 ug via INTRAVENOUS

## 2012-12-25 MED ORDER — HYDROMORPHONE HCL PF 1 MG/ML IJ SOLN
INTRAMUSCULAR | Status: DC | PRN
  Administered 2012-12-25: 0.5 mg via INTRAVENOUS

## 2012-12-25 MED ORDER — LACTATED RINGERS IV SOLN
INTRAVENOUS | Status: DC | PRN
  Administered 2012-12-25 (×2): via INTRAVENOUS

## 2012-12-25 MED ORDER — DEXTROSE 5 % IV SOLN
1.0000 g | INTRAVENOUS | Status: DC
  Administered 2012-12-25 – 2012-12-26 (×2): 1 g via INTRAVENOUS
  Filled 2012-12-25 (×2): qty 10

## 2012-12-25 MED ORDER — CITRIC ACID-SODIUM CITRATE 334-500 MG/5ML PO SOLN
30.0000 mL | Freq: Once | ORAL | Status: AC
  Administered 2012-12-25: 30 mL via ORAL
  Filled 2012-12-25: qty 30

## 2012-12-25 MED ORDER — LIDOCAINE IN DEXTROSE 5-7.5 % IV SOLN
INTRAVENOUS | Status: DC | PRN
  Administered 2012-12-25: 1 mL via INTRATHECAL

## 2012-12-25 MED ORDER — ONDANSETRON HCL 4 MG/2ML IJ SOLN
INTRAMUSCULAR | Status: DC | PRN
  Administered 2012-12-25: 4 mg via INTRAVENOUS

## 2012-12-25 MED ORDER — PANTOPRAZOLE SODIUM 40 MG PO TBEC
40.0000 mg | DELAYED_RELEASE_TABLET | Freq: Every day | ORAL | Status: DC
  Administered 2012-12-25 – 2012-12-27 (×3): 40 mg via ORAL
  Filled 2012-12-25 (×3): qty 1

## 2012-12-25 MED ORDER — DIATRIZOATE MEGLUMINE 30 % UR SOLN
URETHRAL | Status: DC | PRN
  Administered 2012-12-25: 2 mL via URETHRAL

## 2012-12-25 MED ORDER — STERILE WATER FOR IRRIGATION IR SOLN
Status: DC | PRN
  Administered 2012-12-25: 1000 mL

## 2012-12-25 SURGICAL SUPPLY — 17 items
BAG URO CATCHER STRL LF (DRAPE) ×2 IMPLANT
BASKET ZERO TIP NITINOL 2.4FR (BASKET) IMPLANT
BSKT STON RTRVL ZERO TP 2.4FR (BASKET)
CATH INTERMIT  6FR 70CM (CATHETERS) ×1 IMPLANT
CLOTH BEACON ORANGE TIMEOUT ST (SAFETY) ×2 IMPLANT
DRAPE CAMERA CLOSED 9X96 (DRAPES) ×2 IMPLANT
GLOVE BIOGEL PI IND STRL 7.5 (GLOVE) ×1 IMPLANT
GLOVE BIOGEL PI INDICATOR 7.5 (GLOVE) ×1
GLOVE ECLIPSE 7.5 STRL STRAW (GLOVE) ×2 IMPLANT
GOWN PREVENTION PLUS XLARGE (GOWN DISPOSABLE) ×2 IMPLANT
GOWN STRL NON-REIN LRG LVL3 (GOWN DISPOSABLE) ×2 IMPLANT
GUIDEWIRE ANG ZIPWIRE 038X150 (WIRE) IMPLANT
GUIDEWIRE STR DUAL SENSOR (WIRE) ×2 IMPLANT
MANIFOLD NEPTUNE II (INSTRUMENTS) ×2 IMPLANT
PACK CYSTO (CUSTOM PROCEDURE TRAY) ×2 IMPLANT
STENT CONTOUR 7FRX24 (STENTS) ×1 IMPLANT
TUBING CONNECTING 10 (TUBING) ×2 IMPLANT

## 2012-12-25 NOTE — Anesthesia Preprocedure Evaluation (Addendum)
Anesthesia Evaluation  Patient identified by MRN, date of birth, ID band Patient awake  General Assessment Comment:.  Anxiety     .  Bipolar 1 disorder     .  Post traumatic stress disorder (PTSD)     .  Chronic pain         muscle spasms- entire body   .  Infection         UTI   .  Fracture, ankle         left- x2   .  Depression         doing fine now     Reviewed: Allergy & Precautions, H&P , NPO status , Patient's Chart, lab work & pertinent test results  Airway Mallampati: II TM Distance: >3 FB Neck ROM: Full    Dental no notable dental hx.    Pulmonary neg pulmonary ROS,  breath sounds clear to auscultation  Pulmonary exam normal       Cardiovascular negative cardio ROS  Rhythm:Regular Rate:Normal     Neuro/Psych PSYCHIATRIC DISORDERS Anxiety Depression negative neurological ROS     GI/Hepatic negative GI ROS, Neg liver ROS,   Endo/Other  negative endocrine ROS  Renal/GU Attempted right nephrostomy tube, extravasation of contrast.  negative genitourinary   Musculoskeletal negative musculoskeletal ROS (+)   Abdominal (+) + obese,   Peds negative pediatric ROS (+)  Hematology negative hematology ROS (+)   Anesthesia Other Findings   Reproductive/Obstetrics (+) Pregnancy IUP at 33 5/7 with EDC 10-23                           Anesthesia Physical Anesthesia Plan  ASA: II  Anesthesia Plan: Spinal   Post-op Pain Management:    Induction: Intravenous  Airway Management Planned:   Additional Equipment:   Intra-op Plan:   Post-operative Plan: Extubation in OR  Informed Consent: I have reviewed the patients History and Physical, chart, labs and discussed the procedure including the risks, benefits and alternatives for the proposed anesthesia with the patient or authorized representative who has indicated his/her understanding and acceptance.   Dental advisory given  Plan  Discussed with: CRNA  Anesthesia Plan Comments: (Platelets 294K. She has this chest pain from extravasation of contrast with attempted nephrostomy tube placement yesterday. Heart rate has been consistently in the 120s-130s since yesterday. Visibly uncomfortable. Plan lidocaine saddle block as the best option for her. Bicitra 30cc PO. Plan left uterine displacement.  Discussed risks/benefits of spinal including headache, backache, failure, bleeding, infection, and nerve damage. Patient consents to spinal. Questions answered. Platelet count acceptable. The fetus looks good per RN from [facility] rapid response team.)    Anesthesia Quick Evaluation

## 2012-12-25 NOTE — Progress Notes (Addendum)
HD #2 [redacted]W[redacted]D, hydronephrosis Feels a little better but still hurting Afeb, VSS except tachycardic Continue Rocephin q 24 hrs, stent today per Dr. Retta Diones, ok from [facility] standpoint for anesthesia for procedure, needs monitoring of baby prior to and after procedure.

## 2012-12-25 NOTE — Progress Notes (Signed)
Notified Dr. Jackelyn Knife of patient's urgency and frequency with urination, increased white count, and increased pulse; no new orders given at this time

## 2012-12-25 NOTE — Progress Notes (Signed)
At 0145 patient called out to nurses station crying, stated she "needed her nurse"; RN goes in to assess patient; patient crying stating she is in "so much pain" patient positioned on right side; assisted patient to get up to seated position; patient states it "came on so quick it felt like the pain medication completely stopped working"; encouraged patient to use PCA button; after 5 minutes patient stated pain was still "very intense"; gave patient dilaudid 1mg  IM per MD order; pt stated she felt like she needed to void "every 5 minutes" and "feels like (she) has a bladder infection"; RN at bedside every time patient has to void, she reports increased pain with movement; put bedside commode in place to better meet patient's needs; after assisting patient back to bed patient stated she felt "very itchy and nauseated from the medicine"; patient proceed to vomit of emesis and stated that made some of the "pressure pain go away"; gave patient benadryl  12.5mg  IV at 0232 and Zofran 4mg  IV @ 0238; assisted patient to a comfortable position in bed and after 10 minutes patient stated she was beginning to feel "a little better."  Pulse ox and co2 monitor in place and  encouraged patient to use PCA button when needed.

## 2012-12-25 NOTE — Transfer of Care (Signed)
Immediate Anesthesia Transfer of Care Note  Patient: [name] [name]  Procedure(s) Performed: Procedure(s): CYSTOSCOPY WITH STENT PLACEMENT (Right)  Patient Location: PACU  Anesthesia Type:General  Level of Consciousness: awake and alert   Airway & Oxygen Therapy: Patient Spontanous Breathing and Patient connected to face mask oxygen  Post-op Assessment: Report given to PACU RN and Post -op Vital signs reviewed and stable  Post vital signs: Reviewed and stable  Complications: No apparent anesthesia complications

## 2012-12-25 NOTE — Progress Notes (Signed)
Patient ID: [name] [name], female   DOB: 07-Dec-1990, 22 y.o.   MRN: [id number] Pt back from procedure and states still in pain.  Hurts in chest, side and bladder and has received dilaudid in her PCA and rescue doses IM q 2 hours. She has labs ordered for AM and per Dr. Retta Diones may need to stay another 48 hours from procedure on the IV antibiotics given increased WBC pending urine culture. She is requesting and able to have regular diet this pm.  Will leave on IV pain meds tonight and hope to convert to po tomorrow. Fetal monitoring reassuring overall.

## 2012-12-25 NOTE — Anesthesia Procedure Notes (Signed)
Spinal  Patient location during procedure: OR Staffing Anesthesiologist: Azell Der Performed by: anesthesiologist  Preanesthetic Checklist Completed: patient identified, site marked, surgical consent, pre-op evaluation, timeout performed, IV checked, risks and benefits discussed and monitors and equipment checked Spinal Block Patient position: sitting Prep: Betadine Patient monitoring: heart rate, continuous pulse ox and blood pressure Location: L3-4 Injection technique: single-shot Needle Needle type: Sprotte  Needle gauge: 24 G Needle length: 10 cm Additional Notes Expiration date of kit checked and confirmed. Patient tolerated procedure well, without complications. No paresthesia. CSF clear.

## 2012-12-25 NOTE — Progress Notes (Signed)
Pt back from procedure at [facility]--PCA started--fetal monitoring started

## 2012-12-25 NOTE — Progress Notes (Signed)
Arrived to [facility] surgical holding area to monitor patient preoperatively.

## 2012-12-25 NOTE — Op Note (Signed)
Preoperative diagnosis:  obstructive hydronephrosis  Postoperative diagnosis: Same   Procedure: Cystoscopy, right retrograde ureteropyelogram (gentle), placement of right double-J stent (24 cm x 7 French)  Surgeon: Bertram Millard. Nanako Stopher, M.D.   Anesthesia: Subarachnoid block   Complications: None  Specimen(s): urine sent for culture and  Drain(s): previously noted stent  Indications: 22 year old female with hydronephrosis secondary to uterine compression from pregnancy. This was first noted at approximately [redacted] weeks gestation. She initially had percutaneou nephrostomy tube placement. Change of this nephrostomy tube yesterday was unsuccessful, and with her recurrent pain and hydronephrosis urgent stenting is necessary. The patient was admitted last night for observation and pain management, and presents at this time for double-J stent placement. She is aware of risks and complications of the procedure. She desires to proceed.   Technique and findings: the patient was properly identified in the holding area, and her surgical side marked. She was taken the operating room where subarachnoid block was administered. She was also sedated somewhat. She was placed in the dorsolithotomy position, with her right side elevated some to assist with her pregnancy. Genitalia and perineum were prepped and draped. Proper timeout was performed.  I then placed a 22 French panendoscope in her bladder which was inspected. It was normal. I then sent a urine specimen for culture. The right ureteral orifice was cannulated, first with a guidewire, then with a 6 [place] open-ended catheter over top of the guidewire. This was advanced proximally with what I thought was the pelvis. A gentle retrograde ureteropyelogram was performed to confirm position and of the catheter in the pelvis. This revealed mild pyelocaliectasis. I did not totally fill the pyelocalyceal system, however. Following adequate recognition of the renal  pelvis, a guidewire was then introduced through the open-ended catheter, and the catheter removed. Over top of the guidewire, I placed a 24 cm x 7 French double-J stent, with the string removed. Once the guidewire was removed, good proximal and distal curls were seen the stent using fluoroscopic and cystoscopic visualization. At this point, the scope was removed after the bladder was drained. The patient tolerated the procedure well. She was awakened and taken to PACU in stable condition. Fetal heart tones were measured before the procedure, and will be measured in the recovery room.

## 2012-12-25 NOTE — Anesthesia Postprocedure Evaluation (Signed)
  Anesthesia Post-op Note  Patient: [name] [name]  Procedure(s) Performed: Procedure(s) (LRB): CYSTOSCOPY WITH STENT PLACEMENT (Right)  Patient Location: PACU  Anesthesia Type: Spinal  Level of Consciousness: awake and alert   Airway and Oxygen Therapy: Patient Spontanous Breathing  Post-op Pain: mild  Post-op Assessment: Post-op Vital signs reviewed, Patient's Cardiovascular Status Stable, Respiratory Function Stable, Patent Airway and No signs of Nausea or vomiting  Last Vitals:  Filed Vitals:   12/25/12 1630  BP: 136/67  Pulse: 129  Temp:   Resp: 21    Post-op Vital Signs: stable   Complications: No apparent anesthesia complications. Tolerated well. Moving both feet. Fetal heart tones reassuring per [facility] RN. Transfer to [facility] per plan carelink.

## 2012-12-25 NOTE — Consult Note (Signed)
Urology Consult   Physician requesting consult: Meisinger    Reason for consult: hydronephrosis  History of Present Illness: [name] [name] is a 22 y.o. female, [redacted] weeks gestation, with a history of right hydronephrosis secondary to pregnancy.  She was originally seen on 10/23/2012 for flank pain and ultrasound evidence of hydronephrosis.  CT without contrast was performed at that time, revealing no evidence of calculous disease.  Because of her pain, it was recommended that she have drainage of her right kidney, and we thought it most prudent to perform percutaneous nephrostomy tube drainage, to avoid symptoms of a stent.  She had that placed on 10/30/2012.  It was a difficult placement due to anatomic variation.  She had that changed approximately 1 month later.  About 5 days ago, she started having somewhat decreased output.  That was a day or 2 after she had an appointment for  Her nephrostomy tube to be changed.  2 days ago, she had significantly decreased output and pain.  She presented yesterday to interventional radiology, and to change was unsuccessful.  There were significant encrustations of the nephrostomy tube, and it was impossible to replace a nephrostomy tube.  There was some extravasation of contrast used for localization of the nephrostomy into the pleural cavity.  She did not have evidence radiographically of a pneumothorax.  She is currently admitted to [facility] for pain management and for eventual placement of a double-J stent.  She has had tachycardia.  White blood count is 27,000.  She has her usual right lower quadrant pain with this obstruction.  She has been given Rocephin 1 g less than 24 hours ago.   Past Medical History  Diagnosis Date  . Anxiety   . Bipolar 1 disorder   . Post traumatic stress disorder (PTSD)   . Chronic pain     muscle spasms- entire body  . Infection     UTI  . Fracture, ankle     left- x2  . Depression     doing fine now    Past Surgical  History  Procedure Laterality Date  . No past surgeries    . Nephrostomy tube placement at 25wks and removed at 335/7 Right      Current Hospital Medications: Scheduled Meds: . docusate sodium  100 mg Oral Daily  . HYDROmorphone PCA 0.3 mg/mL   Intravenous Q4H  . prenatal multivitamin  1 tablet Oral Q1200   Continuous Infusions: . lactated ringers 75 mL/hr at 12/24/12 2000   PRN Meds:.acetaminophen, calcium carbonate, diphenhydrAMINE, diphenhydrAMINE, HYDROmorphone (DILAUDID) injection, naloxone, ondansetron (ZOFRAN) IV, sodium chloride, zolpidem  Allergies:  Allergies  Allergen Reactions  . Cinnamon Anaphylaxis and Hives  . Shellfish Allergy Anaphylaxis  . Amoxicillin-Pot Clavulanate Nausea And Vomiting  . Nickel Rash    Family History  Problem Relation Age of Onset  . Heart disease Mother   . Hypertension Father   . Cancer Paternal Grandmother     lung  . Heart disease Paternal Grandmother     Social History:  reports that she has been smoking Cigarettes.  She has been smoking about 0.25 packs per day. She has never used smokeless tobacco. She reports that  drinks alcohol. She reports that she does not use illicit drugs.  ROS: A complete review of systems was performed.  All systems are negative except for pertinent findings as noted.  Physical Exam:  Vital signs in last 24 hours: Temp:  [97.8 F (36.6 C)-98.5 F (36.9 C)] 97.9 F (  36.6 C) (09/10 0124) Pulse Rate:  [80-157] 146 (09/10 0701) Resp:  [17-42] 24 (09/10 0601) BP: (0-145)/(0-93) 133/80 mmHg (09/10 0504) SpO2:  [88 %-100 %] 95 % (09/10 0701) Weight:  [82.555 kg (182 lb)] 82.555 kg (182 lb) (09/09 1817) General:  Alert and oriented, at the present time she seems comfortable HEENT: Normocephalic, atraumatic Neck: No JVD or lymphadenopathy Cardiovascular: increased rate, normal rhythm Lungs: Clear bilaterally Abdomen: gravid, with minimal right lower quadrant tenderness.  No peritoneal signs  noted Back: No CVA tenderness Extremities: No edema Neurologic: Grossly intact  Laboratory Data:   Recent Labs  12/24/12 2000  WBC 27.5*  HGB 10.7*  HCT 33.9*  PLT 294     Recent Labs  12/24/12 2000  NA 137  K 4.0  CL 104  GLUCOSE 100*  BUN 6  CALCIUM 9.2  CREATININE 0.45*     Results for orders placed during the hospital encounter of 12/24/12 (from the past 24 hour(s))  CBC WITH DIFFERENTIAL     Status: Abnormal   Collection Time    12/24/12  8:00 PM      Result Value Range   WBC 27.5 (*) 4.0 - 10.5 K/uL   RBC 4.33  3.87 - 5.11 MIL/uL   Hemoglobin 10.7 (*) 12.0 - 15.0 g/dL   HCT 16.1 (*) 09.6 - 04.5 %   MCV 78.3  78.0 - 100.0 fL   MCH 24.7 (*) 26.0 - 34.0 pg   MCHC 31.6  30.0 - 36.0 g/dL   RDW 40.9  81.1 - 91.4 %   Platelets 294  150 - 400 K/uL   Neutrophils Relative % 87 (*) 43 - 77 %   Neutro Abs 23.9 (*) 1.7 - 7.7 K/uL   Lymphocytes Relative 7 (*) 12 - 46 %   Lymphs Abs 1.9  0.7 - 4.0 K/uL   Monocytes Relative 6  3 - 12 %   Monocytes Absolute 1.7 (*) 0.1 - 1.0 K/uL   Eosinophils Relative 0  0 - 5 %   Eosinophils Absolute 0.0  0.0 - 0.7 K/uL   Basophils Relative 0  0 - 1 %   Basophils Absolute 0.0  0.0 - 0.1 K/uL  COMPREHENSIVE METABOLIC PANEL     Status: Abnormal   Collection Time    12/24/12  8:00 PM      Result Value Range   Sodium 137  135 - 145 mEq/L   Potassium 4.0  3.5 - 5.1 mEq/L   Chloride 104  96 - 112 mEq/L   CO2 19  19 - 32 mEq/L   Glucose, Bld 100 (*) 70 - 99 mg/dL   BUN 6  6 - 23 mg/dL   Creatinine, Ser 7.82 (*) 0.50 - 1.10 mg/dL   Calcium 9.2  8.4 - 95.6 mg/dL   Total Protein 6.2  6.0 - 8.3 g/dL   Albumin 2.8 (*) 3.5 - 5.2 g/dL   AST 10  0 - 37 U/L   ALT 5  0 - 35 U/L   Alkaline Phosphatase 190 (*) 39 - 117 U/L   Total Bilirubin 0.2 (*) 0.3 - 1.2 mg/dL   GFR calc non Af Amer >90  >90 mL/min   GFR calc Af Amer >90  >90 mL/min  TYPE AND SCREEN     Status: None   Collection Time    12/24/12  8:00 PM      Result Value Range    ABO/RH(D) AB POS     Antibody Screen  NEG     Sample Expiration 12/27/2012    ABO/RH     Status: None   Collection Time    12/24/12  8:00 PM      Result Value Range   ABO/RH(D) AB POS     No results found for this or any previous visit (from the past 240 hour(s)).  Renal Function:  Recent Labs  12/24/12 2000  CREATININE 0.45*   Estimated Creatinine Clearance: 108.9 ml/min (by C-G formula based on Cr of 0.45).  Radiologic Imaging: Dg Chest 2 View  12/24/2012   *RADIOLOGY REPORT*  Clinical Data: [redacted] weeks pregnant, malfunctioning right nephrostomy tube, chest pain  CHEST - 2 VIEW  Comparison: 19 into 1013  Findings: Slightly lower lung volumes.  Right hemidiaphragm is mildly elevated.  Trachea is midline.  Normal heart size and vascularity.  On the lateral view, there is a small amount of radiodense contrast within the chest over the elevated right hemidiaphragm from the recently attempted nephrostogram.  This may be secondary to diaphragmatic slits, or the original nephrostomy tube may have traversed the diaphragm.  IMPRESSION: No significant pneumothorax.  Low volume exam.  Slight right hemidiaphragm elevation  Trace amount of contrast within the chest from the recently attempted nephrostogram.   Original Report Authenticated By: Judie Petit. Miles Costain, M.D.   [facility]  12/24/2012   *RADIOLOGY REPORT*  Clinical Data: Occluded right nephrostomy tube, [redacted] weeks pregnant  CONSULTATION  Comparison:  11/27/2012  Technique/findings: Under fluoroscopy attempts were made to remove the occluded right nephrostomy tube over a guide wire.  This was unsuccessful in cannulating the prior nephrostomy catheter. Therefore the catheter was removed.  A 5-French catheter was inserted into the percutaneous tract.  Contrast injection was performed.  The contrast migrated superiorly across the diaphragm into the chest.  This resulted in chest pain.  Chest x-ray performed which demonstrated no pneumothorax.  Trace  right effusion related to the contrast injection.  The findings suggest the original nephrostomy tube traverse the diaphragm.  IMPRESSION: Occluded right nephrostomy tube removed.  Contrast injection of the percutaneous tract resulted in contrast reversing the diaphragm into the chest as described.  This suggests the original nephrostomy may have traversed the diaphragm.  The nephrostomy tube has been difficult to maintain since insertion.  After discussion with Dr. Retta Diones,  the patient will be admitted for overnight observation and right ureteral stent placement tomorrow.   Original Report Authenticated By: Judie Petit. Shick, M.D.    I independently reviewed the above imaging studies.  Impression/Assessment:  Pregnancy related right hydronephrosis.  She has had proper drainage up until this time with a nephrostomy tube.  Attempted changing this yesterday was unsuccessful.  She has leukocytosis and tachycardia.  Plan:  1.  Continue IV antibiotic coverage  2.  I spoke with the patient about urgent double J stent placement.  We will do this today.  Unfortunately, she has had significant encrustation of her percutaneous nephrostomy tubes, so I worry about long-term placement of her double-J stent.  That can also get encrusted.  We will hopefully increase her fluid intake following this procedure to decrease calcium saturation, and she may need a stent change prior to parturition.  3.  We will arrange for procedure this morning hopefully, as well as transferred to [facility] by way of [facility] and then back to [facility] following the procedure.

## 2012-12-26 ENCOUNTER — Inpatient Hospital Stay (HOSPITAL_COMMUNITY): Payer: Medicaid Other

## 2012-12-26 ENCOUNTER — Encounter (HOSPITAL_COMMUNITY): Payer: Self-pay | Admitting: Urology

## 2012-12-26 LAB — URINE CULTURE: Culture: NO GROWTH

## 2012-12-26 LAB — CBC
Hemoglobin: 9.4 g/dL — ABNORMAL LOW (ref 12.0–15.0)
MCH: 24.8 pg — ABNORMAL LOW (ref 26.0–34.0)
RBC: 3.79 MIL/uL — ABNORMAL LOW (ref 3.87–5.11)

## 2012-12-26 LAB — BASIC METABOLIC PANEL
CO2: 24 mEq/L (ref 19–32)
Glucose, Bld: 105 mg/dL — ABNORMAL HIGH (ref 70–99)
Potassium: 3.8 mEq/L (ref 3.5–5.1)
Sodium: 134 mEq/L — ABNORMAL LOW (ref 135–145)

## 2012-12-26 MED ORDER — OXYCODONE-ACETAMINOPHEN 5-325 MG PO TABS
2.0000 | ORAL_TABLET | Freq: Four times a day (QID) | ORAL | Status: DC | PRN
  Administered 2012-12-26 – 2012-12-27 (×3): 2 via ORAL
  Filled 2012-12-26 (×3): qty 2

## 2012-12-26 MED ORDER — SODIUM CHLORIDE 0.9 % IV SOLN
INTRAVENOUS | Status: DC
  Administered 2012-12-26: 04:00:00 via INTRAVENOUS

## 2012-12-26 MED ORDER — OXYBUTYNIN CHLORIDE 5 MG PO TABS
5.0000 mg | ORAL_TABLET | Freq: Three times a day (TID) | ORAL | Status: DC | PRN
  Administered 2012-12-26 – 2012-12-27 (×3): 5 mg via ORAL
  Filled 2012-12-26 (×4): qty 1

## 2012-12-26 MED ORDER — MAGNESIUM HYDROXIDE 400 MG/5ML PO SUSP
30.0000 mL | Freq: Every day | ORAL | Status: DC | PRN
  Administered 2012-12-26: 30 mL via ORAL
  Filled 2012-12-26: qty 30

## 2012-12-26 MED ORDER — SODIUM CHLORIDE 0.9 % IJ SOLN: 3.0000 mL | INTRAMUSCULAR | Status: DC | PRN

## 2012-12-26 MED ORDER — SODIUM CHLORIDE 0.9 % IJ SOLN: 3.0000 mL | Freq: Two times a day (BID) | INTRAMUSCULAR | Status: DC

## 2012-12-26 NOTE — Progress Notes (Signed)
1 Day Post-Op Subjective: Patient reports less pain, but does have urinary frequency. She has had no significant nausea.   Objective: Vital signs in last 24 hours: Temp:  [98.5 F (36.9 C)-99.5 F (37.5 C)] 98.8 F (37.1 C) (09/11 0415) Pulse Rate:  [112-153] 112 (09/11 0504) Resp:  [16-28] 20 (09/11 0504) BP: (120-158)/(67-91) 125/69 mmHg (09/11 0415) SpO2:  [89 %-100 %] 89 % (09/11 0504) Weight:  [88.996 kg (196 lb 3.2 oz)] 88.996 kg (196 lb 3.2 oz) (09/10 0827)  Intake/Output from previous day: 09/10 0701 - 09/11 0700 In: 3987.9 [P.O.:1320; I.V.:2617.9; IV Piggyback:50] Out: 975 [Urine:975] Intake/Output this shift: Total I/O In: 1320 [P.O.:720; I.V.:600] Out: 550 [Urine:550]  Physical Exam:  Constitutional: Vital signs reviewed. WD WN in NAD   Eyes: PERRL, No scleral icterus.   Cardiovascular: Heart rate approximately 120 Pulmonary/Chest: Normal effort   Lab Results:  Recent Labs  12/24/12 2000 12/26/12 0535  HGB 10.7* 9.4*  HCT 33.9* 29.2*   BMET  Recent Labs  12/24/12 2000  NA 137  K 4.0  CL 104  CO2 19  GLUCOSE 100*  BUN 6  CREATININE 0.45*  CALCIUM 9.2   No results found for this basename: LABPT, INR,  in the last 72 hours No results found for this basename: LABURIN,  in the last 72 hours Results for orders placed during the hospital encounter of 10/18/12  URINE CULTURE     Status: None   Collection Time    10/18/12  7:40 PM      Result Value Range Status   Specimen Description URINE, CLEAN CATCH   Final   Special Requests NONE   Final   Culture  Setup Time 10/18/2012 23:11   Final   Colony Count 5,000 COLONIES/ML   Final   Culture INSIGNIFICANT GROWTH   Final   Report Status 10/20/2012 FINAL   Final    Studies/Results: Dg Chest 2 View  12/24/2012   *RADIOLOGY REPORT*  Clinical Data: [redacted] weeks pregnant, malfunctioning right nephrostomy tube, chest pain  CHEST - 2 VIEW  Comparison: 19 into 1013  Findings: Slightly lower lung volumes.  Right  hemidiaphragm is mildly elevated.  Trachea is midline.  Normal heart size and vascularity.  On the lateral view, there is a small amount of radiodense contrast within the chest over the elevated right hemidiaphragm from the recently attempted nephrostogram.  This may be secondary to diaphragmatic slits, or the original nephrostomy tube may have traversed the diaphragm.  IMPRESSION: No significant pneumothorax.  Low volume exam.  Slight right hemidiaphragm elevation  Trace amount of contrast within the chest from the recently attempted nephrostogram.   Original Report Authenticated By: Judie Petit. Miles Costain, M.D.   [facility]  12/24/2012   *RADIOLOGY REPORT*  Clinical Data: Occluded right nephrostomy tube, [redacted] weeks pregnant  CONSULTATION  Comparison:  11/27/2012  Technique/findings: Under fluoroscopy attempts were made to remove the occluded right nephrostomy tube over a guide wire.  This was unsuccessful in cannulating the prior nephrostomy catheter. Therefore the catheter was removed.  A 5-French catheter was inserted into the percutaneous tract.  Contrast injection was performed.  The contrast migrated superiorly across the diaphragm into the chest.  This resulted in chest pain.  Chest x-ray performed which demonstrated no pneumothorax.  Trace right effusion related to the contrast injection.  The findings suggest the original nephrostomy tube traverse the diaphragm.  IMPRESSION: Occluded right nephrostomy tube removed.  Contrast injection of the percutaneous tract resulted in  contrast reversing the diaphragm into the chest as described.  This suggests the original nephrostomy may have traversed the diaphragm.  The nephrostomy tube has been difficult to maintain since insertion.  After discussion with Dr. Retta Diones,  the patient will be admitted for overnight observation and right ureteral stent placement tomorrow.   Original Report Authenticated By: Judie Petit. Miles Costain, M.D.    Assessment/Plan:   Status post  stent placement in the right ureter for hydronephrosis secondary to uterine compression. She has a probable UTI. White count is decreasing. The patient is clinically a little better today.    I would continue IV antibiotics. Urine cultures are pending. She may be able to be switched from IV to oral pain medicines. I have added Ditropan for bladder spasms. I will continue to follow.   LOS: 2 days   Marcine Matar M 12/26/2012, 6:04 AM

## 2012-12-26 NOTE — Progress Notes (Signed)
Pt sitting on the BSC, complaints of frequency and urgency and not feeling like completely emptying her bladder.  Ditropan given and PCA dilaudid d/cd and Percocet 2 po given.

## 2012-12-26 NOTE — Progress Notes (Signed)
MD notified that 02 initiated 2 LPM via [place] for sats 85-96%  Pt alert and oriented and VSS with resp 18-20.  MD aware

## 2012-12-26 NOTE — Progress Notes (Signed)
Pt up to the bathroom

## 2012-12-26 NOTE — Progress Notes (Signed)
HD #3, [redacted]W[redacted]D, right hydronephrosis Still hurting, about the same, constipated Afeb, VSS except still tachy Fundus NT FHT- Cat I Will continue current care, continue Rocephin, try to change to PO pain meds when Dr. Retta Diones thinks appropriate, MOM prn

## 2012-12-26 NOTE — Progress Notes (Signed)
Pt off the monitor after ressurring FHR, pt up to BR for BM

## 2012-12-26 NOTE — Progress Notes (Signed)
Patient ID: BRE PECINA, female   DOB: 1990/05/27, 21 y.o.   MRN: [id number]  D/C PCA Change to 2 percocet (5/325) po q 6hr prn Maintain breakthrough IV pain meds  D/W incentive spirometer and atelectasis/O2 requirement D/W pt, voices understanding

## 2012-12-26 NOTE — Progress Notes (Signed)
Pt off the monitor after reassurring FHR  

## 2012-12-27 NOTE — Progress Notes (Signed)
Pt off the monitor after reassurring FHR  

## 2012-12-27 NOTE — Progress Notes (Signed)
2 Days Post-Op Subjective: Patient reports that she is feeling better. She still has a fair amount of urinary urgency, however. Urine culture was negative.  Objective: Vital signs in last 24 hours: Temp:  [98.4 F (36.9 C)-99.4 F (37.4 C)] 98.6 F (37 C) (09/12 0753) Pulse Rate:  [97-146] 117 (09/12 0754) Resp:  [18-24] 20 (09/12 0753) BP: (126-153)/(77-88) 138/82 mmHg (09/12 0753) SpO2:  [74 %-99 %] 95 % (09/12 0753) FiO2 (%):  [31 %-45 %] 31 % (09/11 1820) Weight:  [91.808 kg (202 lb 6.4 oz)] 91.808 kg (202 lb 6.4 oz) (09/11 1238)  Intake/Output from previous day:   Intake/Output this shift:    Physical Exam:  Constitutional: Vital signs reviewed. WD WN in NAD   Eyes: PERRL, No scleral icterus.   Cardiovascular: RRR Pulmonary/Chest: Normal effort Abdominal: Soft. Non-tender, non-distended, bowel sounds are normal, no masses, organomegaly, or guarding present.  Genitourinary: Extremities: No cyanosis or edema   Lab Results:  Recent Labs  12/24/12 2000 12/26/12 0535  HGB 10.7* 9.4*  HCT 33.9* 29.2*   BMET  Recent Labs  12/24/12 2000 12/26/12 0535  NA 137 134*  K 4.0 3.8  CL 104 99  CO2 19 24  GLUCOSE 100* 105*  BUN 6 4*  CREATININE 0.45* 0.50  CALCIUM 9.2 9.4   No results found for this basename: LABPT, INR,  in the last 72 hours No results found for this basename: LABURIN,  in the last 72 hours Results for orders placed during the hospital encounter of 12/24/12  URINE CULTURE     Status: None   Collection Time    12/25/12  2:53 PM      Result Value Range Status   Specimen Description     Final   Value: URINE, RANDOM     Performed at [facility]   Special Requests     Final   Value: NONE     Performed at [facility]   Culture  Setup Time     Final   Value: 12/25/2012 21:50     Performed at [facility]   Colony Count     Final   Value: NO GROWTH     Performed at [facility]   Culture      Final   Value: NO GROWTH     Performed at [facility]   Report Status 12/26/2012 FINAL   Final    Studies/Results: Dg Chest 2 View  12/26/2012   CLINICAL DATA:  Thirty-four weeks pregnant. Chest pain and hypoxia. Pleuritic chest pain.  EXAM: CHEST  2 VIEW  COMPARISON:  12/24/2012  FINDINGS: Low lung volumes again noted. There is been interval development of bibasilar infiltrates or atelectasis. No evidence of pleural effusion. Heart size remains within normal limits.  IMPRESSION: Persistent low lung volumes, with new bibasilar atelectasis versus infiltrates.   Electronically Signed   By: Myles Rosenthal   On: 12/26/2012 11:36    Assessment/Plan:   Status post stent placement for obstructed ureter. Culture is negative. I feel is okay to go home. The patient was given a prescription for Percocet #40, Ditropan 5 mg one by mouth every 8 hours when necessary frequency as well as Keflex 500 mg, one by mouth every 8 hours x5 days. She will followup in approximately one week.   LOS: 3 days   Marcine Matar M 12/27/2012, 8:50 AM

## 2012-12-27 NOTE — Progress Notes (Signed)
HD #4, [redacted]W[redacted]D Feeling better, still some pain and chest discomfort Afeb, VSS FHT- Cat I Ok to d/c home from [facility] standpoint, and pt and nurse say Dr. Retta Diones says she is ok to go home and he gave her Rx

## 2012-12-27 NOTE — Progress Notes (Signed)
Monitor straps off to assist with pt comfort

## 2012-12-29 ENCOUNTER — Inpatient Hospital Stay (HOSPITAL_COMMUNITY)
Admission: AD | Admit: 2012-12-29 | Discharge: 2012-12-29 | Disposition: A | Discharge status: Home / Self Care | Payer: Medicaid Other | Source: Ambulatory Visit | Attending: Obstetrics and Gynecology | Admitting: Obstetrics and Gynecology

## 2012-12-29 ENCOUNTER — Encounter (HOSPITAL_COMMUNITY): Payer: Self-pay | Admitting: *Deleted

## 2012-12-29 DIAGNOSIS — O9989 Other specified diseases and conditions complicating pregnancy, childbirth and the puerperium: Secondary | ICD-10-CM

## 2012-12-29 DIAGNOSIS — R109 Unspecified abdominal pain: Secondary | ICD-10-CM | POA: Insufficient documentation

## 2012-12-29 DIAGNOSIS — N949 Unspecified condition associated with female genital organs and menstrual cycle: Secondary | ICD-10-CM

## 2012-12-29 DIAGNOSIS — R102 Pelvic and perineal pain: Secondary | ICD-10-CM

## 2012-12-29 DIAGNOSIS — O99891 Other specified diseases and conditions complicating pregnancy: Secondary | ICD-10-CM | POA: Insufficient documentation

## 2012-12-29 HISTORY — DX: Unspecified hydronephrosis: N13.30

## 2012-12-29 LAB — COMPREHENSIVE METABOLIC PANEL
AST: 10 U/L (ref 0–37)
Albumin: 2.4 g/dL — ABNORMAL LOW (ref 3.5–5.2)
CO2: 25 mEq/L (ref 19–32)
Calcium: 9.5 mg/dL (ref 8.4–10.5)
Creatinine, Ser: 0.57 mg/dL (ref 0.50–1.10)
GFR calc non Af Amer: >90 mL/min (ref >90)
Total Protein: 6.4 g/dL (ref 6.0–8.3)

## 2012-12-29 LAB — URINALYSIS, ROUTINE W REFLEX MICROSCOPIC
Glucose, UA: NEGATIVE mg/dL
Specific Gravity, Urine: <1.005 — ABNORMAL LOW (ref 1.005–1.030)
pH: 7 (ref 5.0–8.0)

## 2012-12-29 LAB — URINE MICROSCOPIC-ADD ON

## 2012-12-29 LAB — CBC
MCH: 24.2 pg — ABNORMAL LOW (ref 26.0–34.0)
MCHC: 32.2 g/dL (ref 30.0–36.0)
MCV: 75.1 fL — ABNORMAL LOW (ref 78.0–100.0)
Platelets: 325 10*3/uL (ref 150–400)
RDW: 13.8 % (ref 11.5–15.5)

## 2012-12-29 NOTE — MAU Provider Note (Signed)
History     CSN: [id number]  Arrival date and time: 12/29/12 0107   First Provider Initiated Contact with Patient 12/29/12 0203      Chief Complaint  Patient presents with  . Abdominal Pain   HPI Pt is a G1P0000 here at [redacted]w[redacted]d with report of right double J stent placed on 9/10. Patient presents with primary complaint of vaginal and abdominal pressure. Abdominal pain is intermittent in nature and described as a random sharp "stabbing" pain on RUQ.  While having her nephrostomy tube on 12/24/12 there was extravasation of the contrast.  Pt was informed that pain in the chest was normal after this type of incident.  Here tonight primarily for concern of labor due to feeling pressure in lower pelvis.    Also noticed increased swelling in both legs while at baby shower today.    Past Medical History  Diagnosis Date  . Anxiety   . Bipolar 1 disorder   . Post traumatic stress disorder (PTSD)   . Chronic pain     muscle spasms- entire body  . Infection     UTI  . Fracture, ankle     left- x2  . Depression     doing fine now    Past Surgical History  Procedure Laterality Date  . No past surgeries    . Nephrostomy tube placement at 25wks and removed at 335/7 Right   . Cystoscopy with stent placement Right 12/25/2012    Procedure: CYSTOSCOPY WITH STENT PLACEMENT;  Surgeon: Marcine Matar, MD;  Location: [facility];  Service: Urology;  Laterality: Right;    Family History  Problem Relation Age of Onset  . Heart disease Mother   . Hypertension Father   . Cancer Paternal Grandmother     lung  . Heart disease Paternal Grandmother     History  Substance Use Topics  . Smoking status: Current Some Day Smoker -- 0.25 packs/day    Types: Cigarettes  . Smokeless tobacco: Never Used     Comment: has cut down  . Alcohol Use: Yes     Comment: occasionally-not now    Allergies:  Allergies  Allergen Reactions  . Cinnamon Anaphylaxis and Hives  . Shellfish Allergy Anaphylaxis  .  Amoxicillin-Pot Clavulanate Nausea And Vomiting  . Nickel Rash    Prescriptions prior to admission  Medication Sig Dispense Refill  . cephALEXin (KEFLEX) 500 MG capsule Take 500 mg by mouth 3 (three) times daily.      . diphenhydrAMINE (BENADRYL) 25 mg capsule Take 25 mg by mouth every 6 (six) hours as needed for itching.      Marland Kitchen oxybutynin (DITROPAN) 5 MG tablet Take 5 mg by mouth 3 (three) times daily.      Marland Kitchen oxyCODONE-acetaminophen (PERCOCET) 10-325 MG per tablet Take 1 tablet by mouth every 4 (four) hours as needed for pain.      Marland Kitchen oxyCODONE-acetaminophen (PERCOCET/ROXICET) 5-325 MG per tablet Take 1 tablet by mouth every 4 (four) hours as needed for pain.      Marland Kitchen ondansetron (ZOFRAN) 8 MG tablet Take 8 mg by mouth every 8 (eight) hours as needed for nausea.      . pantoprazole (PROTONIX) 40 MG tablet Take 40 mg by mouth daily.      . ranitidine (ZANTAC) 75 MG tablet Take 75 mg by mouth daily.      Marland Kitchen terconazole (TERAZOL 3) 0.8 % vaginal cream Place 1 applicator vaginally daily as needed (for yeast infection).  Review of Systems  Constitutional: Negative for fever.  Respiratory: Negative for cough and shortness of breath.   Cardiovascular: Positive for chest pain (upper right sided pain). Claudication: bilat.  Gastrointestinal: Positive for abdominal pain (lower pressure).  Genitourinary: Negative for flank pain.  Musculoskeletal:       Bilat pedal edema  All other systems reviewed and are negative.   Physical Exam   Blood pressure 136/86, pulse 122, temperature 98.6 F (37 C), temperature source Oral, resp. rate 20, height 5\' 2"  (1.575 m), weight 92.987 kg (205 lb), last menstrual period 05/13/2012, SpO2 96.00%.  Physical Exam  Constitutional: She is oriented to person, place, and time. She appears well-developed and well-nourished. No distress.  HENT:  Head: Normocephalic.  Neck: Normal range of motion. Neck supple.  Cardiovascular: Normal rate, regular rhythm and  normal heart sounds.  Exam reveals no gallop and no friction rub.   No murmur heard. Respiratory: Effort normal and breath sounds normal. No respiratory distress.  GI: She exhibits no mass. There is no tenderness. There is no rebound, no guarding and no CVA tenderness.  Genitourinary: Cervix exhibits no motion tenderness and no discharge. Vaginal discharge (mucus like) found.  Musculoskeletal: Normal range of motion.  Neurological: She is alert and oriented to person, place, and time.  Skin: Skin is warm and dry.  Psychiatric: She has a normal mood and affect.   FHR 150's, +accel, reactive Toco - none  Dilation: Closed Effacement (%): 0 Cervical Position: Posterior Station: Ballotable Exam by:: Roney Marion, CNM  MAU Course  Procedures  Results for orders placed during the hospital encounter of 12/29/12 (from the past 24 hour(s))  URINALYSIS, ROUTINE W REFLEX MICROSCOPIC     Status: Abnormal   Collection Time    12/29/12  1:15 AM      Result Value Range   Color, Urine YELLOW  YELLOW   APPearance CLEAR  CLEAR   Specific Gravity, Urine <1.005 (*) 1.005 - 1.030   pH 7.0  5.0 - 8.0   Glucose, UA NEGATIVE  NEGATIVE mg/dL   Hgb urine dipstick LARGE (*) NEGATIVE   Bilirubin Urine NEGATIVE  NEGATIVE   Ketones, ur NEGATIVE  NEGATIVE mg/dL   Protein, ur NEGATIVE  NEGATIVE mg/dL   Urobilinogen, UA 0.2  0.0 - 1.0 mg/dL   Nitrite NEGATIVE  NEGATIVE   Leukocytes, UA SMALL (*) NEGATIVE  URINE MICROSCOPIC-ADD ON     Status: None   Collection Time    12/29/12  1:15 AM      Result Value Range   Squamous Epithelial / LPF RARE  RARE   WBC, UA 3-6  <3 WBC/hpf   RBC / HPF 3-6  <3 RBC/hpf   Bacteria, UA RARE  RARE  CBC     Status: Abnormal   Collection Time    12/29/12  2:25 AM      Result Value Range   WBC 16.6 (*) 4.0 - 10.5 K/uL   RBC 4.22  3.87 - 5.11 MIL/uL   Hemoglobin 10.2 (*) 12.0 - 15.0 g/dL   HCT 16.1 (*) 09.6 - 04.5 %   MCV 75.1 (*) 78.0 - 100.0 fL   MCH 24.2 (*) 26.0 -  34.0 pg   MCHC 32.2  30.0 - 36.0 g/dL   RDW 40.9  81.1 - 91.4 %   Platelets 325  150 - 400 K/uL  COMPREHENSIVE METABOLIC PANEL     Status: Abnormal   Collection Time    12/29/12  2:25 AM  Result Value Range   Sodium 134 (*) 135 - 145 mEq/L   Potassium 4.1  3.5 - 5.1 mEq/L   Chloride 99  96 - 112 mEq/L   CO2 25  19 - 32 mEq/L   Glucose, Bld 91  70 - 99 mg/dL   BUN 4 (*) 6 - 23 mg/dL   Creatinine, Ser 1.61  0.50 - 1.10 mg/dL   Calcium 9.5  8.4 - 09.6 mg/dL   Total Protein 6.4  6.0 - 8.3 g/dL   Albumin 2.4 (*) 3.5 - 5.2 g/dL   AST 10  0 - 37 U/L   ALT 7  0 - 35 U/L   Alkaline Phosphatase 179 (*) 39 - 117 U/L   Total Bilirubin 0.2 (*) 0.3 - 1.2 mg/dL   GFR calc non Af Amer >90  >90 mL/min   GFR calc Af Amer >90  >90 mL/min   0315 Consulted with Dr. Ambrose Mantle, reviewed HPI/OB history/exam/labs/cervical exam >discharge to home  Assessment and Plan  G1P0000 at [redacted]w[redacted]d with Pelvic Pressure S/P Uretal Stent  Plan: Discharge to home Keep scheduled appointment for Monday Return for worsening of symptoms  [facility] 12/29/2012, 2:05 AM

## 2012-12-29 NOTE — MAU Note (Signed)
Patient had a right double J stent placed on 9/10.  Patient presents with complaints of vaginal and abdominal pressure. Went to her baby shower and noticed she has a increase in swelling in her leg. When she got home swelling worsened in addition to abdominal cramping and vaginal pain.

## 2012-12-30 ENCOUNTER — Inpatient Hospital Stay (HOSPITAL_COMMUNITY)
Admission: AD | Admit: 2012-12-30 | Discharge: 2013-01-11 | DRG: 981 | Disposition: A | Discharge status: Home / Self Care | Payer: Medicaid Other | Source: Ambulatory Visit | Attending: Critical Care Medicine | Admitting: Critical Care Medicine

## 2012-12-30 ENCOUNTER — Inpatient Hospital Stay (HOSPITAL_COMMUNITY): Payer: Medicaid Other

## 2012-12-30 ENCOUNTER — Inpatient Hospital Stay: Admission: AD | Admit: 2012-12-30 | Payer: Medicaid Other | Source: Ambulatory Visit | Admitting: Pulmonary Disease

## 2012-12-30 ENCOUNTER — Encounter (HOSPITAL_COMMUNITY): Payer: Self-pay | Admitting: *Deleted

## 2012-12-30 DIAGNOSIS — O26839 Pregnancy related renal disease, unspecified trimester: Secondary | ICD-10-CM | POA: Diagnosis present

## 2012-12-30 DIAGNOSIS — Z3490 Encounter for supervision of normal pregnancy, unspecified, unspecified trimester: Secondary | ICD-10-CM

## 2012-12-30 DIAGNOSIS — J690 Pneumonitis due to inhalation of food and vomit: Secondary | ICD-10-CM | POA: Diagnosis present

## 2012-12-30 DIAGNOSIS — K219 Gastro-esophageal reflux disease without esophagitis: Secondary | ICD-10-CM | POA: Diagnosis present

## 2012-12-30 DIAGNOSIS — N179 Acute kidney failure, unspecified: Secondary | ICD-10-CM

## 2012-12-30 DIAGNOSIS — O99344 Other mental disorders complicating childbirth: Secondary | ICD-10-CM | POA: Diagnosis present

## 2012-12-30 DIAGNOSIS — Z91013 Allergy to seafood: Secondary | ICD-10-CM

## 2012-12-30 DIAGNOSIS — J869 Pyothorax without fistula: Secondary | ICD-10-CM

## 2012-12-30 DIAGNOSIS — J189 Pneumonia, unspecified organism: Secondary | ICD-10-CM

## 2012-12-30 DIAGNOSIS — O9882 Other maternal infectious and parasitic diseases complicating childbirth: Principal | ICD-10-CM | POA: Diagnosis present

## 2012-12-30 DIAGNOSIS — F411 Generalized anxiety disorder: Secondary | ICD-10-CM | POA: Diagnosis present

## 2012-12-30 DIAGNOSIS — F4310 Post-traumatic stress disorder, unspecified: Secondary | ICD-10-CM | POA: Diagnosis present

## 2012-12-30 DIAGNOSIS — R6510 Systemic inflammatory response syndrome (SIRS) of non-infectious origin without acute organ dysfunction: Secondary | ICD-10-CM

## 2012-12-30 DIAGNOSIS — R0781 Pleurodynia: Secondary | ICD-10-CM

## 2012-12-30 DIAGNOSIS — A419 Sepsis, unspecified organism: Secondary | ICD-10-CM | POA: Diagnosis present

## 2012-12-30 DIAGNOSIS — N1330 Unspecified hydronephrosis: Secondary | ICD-10-CM

## 2012-12-30 DIAGNOSIS — Z79899 Other long term (current) drug therapy: Secondary | ICD-10-CM

## 2012-12-30 DIAGNOSIS — F429 Obsessive-compulsive disorder, unspecified: Secondary | ICD-10-CM | POA: Diagnosis present

## 2012-12-30 DIAGNOSIS — D62 Acute posthemorrhagic anemia: Secondary | ICD-10-CM | POA: Diagnosis not present

## 2012-12-30 DIAGNOSIS — R071 Chest pain on breathing: Secondary | ICD-10-CM

## 2012-12-30 DIAGNOSIS — F17200 Nicotine dependence, unspecified, uncomplicated: Secondary | ICD-10-CM | POA: Diagnosis present

## 2012-12-30 DIAGNOSIS — G8929 Other chronic pain: Secondary | ICD-10-CM | POA: Diagnosis present

## 2012-12-30 DIAGNOSIS — Z331 Pregnant state, incidental: Secondary | ICD-10-CM

## 2012-12-30 DIAGNOSIS — J90 Pleural effusion, not elsewhere classified: Secondary | ICD-10-CM

## 2012-12-30 DIAGNOSIS — O99892 Other specified diseases and conditions complicating childbirth: Secondary | ICD-10-CM | POA: Diagnosis present

## 2012-12-30 DIAGNOSIS — F319 Bipolar disorder, unspecified: Secondary | ICD-10-CM | POA: Diagnosis present

## 2012-12-30 DIAGNOSIS — N944 Primary dysmenorrhea: Secondary | ICD-10-CM

## 2012-12-30 DIAGNOSIS — O9902 Anemia complicating childbirth: Secondary | ICD-10-CM | POA: Diagnosis present

## 2012-12-30 DIAGNOSIS — K5900 Constipation, unspecified: Secondary | ICD-10-CM | POA: Diagnosis present

## 2012-12-30 DIAGNOSIS — O99334 Smoking (tobacco) complicating childbirth: Secondary | ICD-10-CM | POA: Diagnosis present

## 2012-12-30 HISTORY — DX: Obsessive-compulsive disorder, unspecified: F42.9

## 2012-12-30 LAB — PROCALCITONIN: Procalcitonin: 0.11 ng/mL

## 2012-12-30 LAB — CBC
Hemoglobin: 10.2 g/dL — ABNORMAL LOW (ref 12.0–15.0)
MCH: 24.8 pg — ABNORMAL LOW (ref 26.0–34.0)
RBC: 4.12 MIL/uL (ref 3.87–5.11)

## 2012-12-30 LAB — LACTIC ACID, PLASMA: Lactic Acid, Venous: 1.1 mmol/L (ref 0.5–2.2)

## 2012-12-30 MED ORDER — ZOLPIDEM TARTRATE 5 MG PO TABS: 5.0000 mg | ORAL_TABLET | Freq: Every evening | ORAL | Status: DC | PRN

## 2012-12-30 MED ORDER — PROMETHAZINE HCL 25 MG/ML IJ SOLN
12.5000 mg | Freq: Four times a day (QID) | INTRAMUSCULAR | Status: DC | PRN
  Filled 2012-12-30: qty 1

## 2012-12-30 MED ORDER — HYDROMORPHONE HCL PF 2 MG/ML IJ SOLN
2.0000 mg | Freq: Once | INTRAMUSCULAR | Status: AC
  Administered 2012-12-30: 2 mg via INTRAVENOUS

## 2012-12-30 MED ORDER — VANCOMYCIN HCL IN DEXTROSE 1-5 GM/200ML-% IV SOLN
1000.0000 mg | Freq: Three times a day (TID) | INTRAVENOUS | Status: DC
  Administered 2012-12-30 – 2012-12-31 (×3): 1000 mg via INTRAVENOUS
  Filled 2012-12-30 (×6): qty 200

## 2012-12-30 MED ORDER — POLYETHYLENE GLYCOL 3350 17 G PO PACK
17.0000 g | PACK | Freq: Every day | ORAL | Status: DC | PRN
  Administered 2012-12-30 – 2012-12-31 (×2): 17 g via ORAL
  Filled 2012-12-30 (×3): qty 1

## 2012-12-30 MED ORDER — PRENATAL MULTIVITAMIN CH
1.0000 | ORAL_TABLET | Freq: Every day | ORAL | Status: DC
  Administered 2012-12-31: 1 via ORAL
  Filled 2012-12-30 (×4): qty 1

## 2012-12-30 MED ORDER — DOCUSATE SODIUM 100 MG PO CAPS
100.0000 mg | ORAL_CAPSULE | Freq: Every day | ORAL | Status: DC
  Administered 2012-12-31 – 2013-01-02 (×3): 100 mg via ORAL
  Filled 2012-12-30 (×4): qty 1

## 2012-12-30 MED ORDER — ONDANSETRON HCL 4 MG PO TABS
8.0000 mg | ORAL_TABLET | Freq: Once | ORAL | Status: AC
  Administered 2012-12-30: 8 mg via ORAL
  Filled 2012-12-30: qty 2

## 2012-12-30 MED ORDER — HYDROMORPHONE HCL PF 2 MG/ML IJ SOLN
2.0000 mg | Freq: Once | INTRAMUSCULAR | Status: DC
  Filled 2012-12-30 (×2): qty 1

## 2012-12-30 MED ORDER — CALCIUM CARBONATE ANTACID 500 MG PO CHEW
2.0000 | CHEWABLE_TABLET | ORAL | Status: DC | PRN
  Administered 2012-12-30 – 2013-01-01 (×6): 400 mg via ORAL
  Filled 2012-12-30 (×9): qty 2

## 2012-12-30 MED ORDER — HYDROMORPHONE HCL PF 1 MG/ML IJ SOLN
0.5000 mg | INTRAMUSCULAR | Status: DC | PRN
  Administered 2012-12-30 – 2013-01-02 (×12): 0.5 mg via INTRAVENOUS
  Filled 2012-12-30 (×12): qty 1

## 2012-12-30 MED ORDER — PIPERACILLIN-TAZOBACTAM 3.375 G IVPB
3.3750 g | Freq: Three times a day (TID) | INTRAVENOUS | Status: DC
  Administered 2012-12-30 – 2013-01-11 (×35): 3.375 g via INTRAVENOUS
  Filled 2012-12-30 (×44): qty 50

## 2012-12-30 MED ORDER — ACETAMINOPHEN 325 MG PO TABS: 650.0000 mg | ORAL_TABLET | ORAL | Status: DC | PRN

## 2012-12-30 MED ORDER — LACTATED RINGERS IV SOLN: INTRAVENOUS | Status: DC

## 2012-12-30 MED ORDER — OXYCODONE-ACETAMINOPHEN 5-325 MG PO TABS
1.0000 | ORAL_TABLET | ORAL | Status: DC | PRN
  Administered 2012-12-30 – 2012-12-31 (×5): 2 via ORAL
  Administered 2012-12-31: 1 via ORAL
  Administered 2013-01-01: 2 via ORAL
  Filled 2012-12-30: qty 2
  Filled 2012-12-30: qty 1
  Filled 2012-12-30 (×5): qty 2
  Filled 2012-12-30: qty 1

## 2012-12-30 MED ORDER — ONDANSETRON HCL 4 MG PO TABS: 8.0000 mg | ORAL_TABLET | Freq: Three times a day (TID) | ORAL | Status: DC | PRN

## 2012-12-30 NOTE — H&P (Signed)
[name] [name] is a 22 y.o. female, G1 P0, EGA [redacted]W[redacted]D with EDC 10-23 presenting for evaluation of SOB and chest pain.  Pt admitted last week after complications replacing her nephrostomy tube for right hydronephrosis.  She had a right ureteral stent placed by Dr. Retta Diones.  She was discharged home 9-12 with pain meds.  She presented to the office today with increased chest pain and SOB.  Eval in MAU reveals right pleural effusion by CXR.   Maternal Medical History:  Fetal activity: Perceived fetal activity is decreased.      OB History   Grav Para Term Preterm Abortions TAB SAB Ect Mult Living   1 0 0 0 0 0 0 0 0 0      Past Medical History  Diagnosis Date  . Anxiety   . Bipolar 1 disorder   . Post traumatic stress disorder (PTSD)   . Chronic pain     muscle spasms- entire body  . Infection     UTI  . Fracture, ankle     left- x2  . Depression     doing fine now  . Hydronephrosis    Past Surgical History  Procedure Laterality Date  . No past surgeries    . Nephrostomy tube placement at 25wks and removed at 335/7 Right   . Cystoscopy with stent placement Right 12/25/2012    Procedure: CYSTOSCOPY WITH STENT PLACEMENT;  Surgeon: Marcine Matar, MD;  Location: [facility];  Service: Urology;  Laterality: Right;   Family History: family history includes Cancer in her paternal grandmother; Heart disease in her mother and paternal grandmother; Hypertension in her father. Social History:  reports that she has been smoking Cigarettes.  She has been smoking about 0.25 packs per day. She has never used smokeless tobacco. She reports that  drinks alcohol. She reports that she does not use illicit drugs.   Review of Systems  Respiratory: Positive for shortness of breath.   Cardiovascular: Positive for chest pain.      Blood pressure 131/91, pulse 119, temperature 99 F (37.2 C), temperature source Oral, resp. rate 30, height 5\' 2"  (1.575 m), last menstrual period 05/13/2012, SpO2  96.00%. Maternal Exam:  Uterine Assessment: Contraction strength is mild.  Contraction frequency is irregular.   Abdomen: Patient reports no abdominal tenderness.   Physical Exam  Constitutional: She appears well-developed and well-nourished.  Cardiovascular: Regular rhythm and normal heart sounds.   tachy  Respiratory: She is in respiratory distress. She has rales.  Decreased breath sound right base with crackles  GI: Soft.  gravid    Prenatal labs: ABO, Rh: --/--/AB POS, AB POS (09/09 2000) Antibody: NEG (09/09 2000) Rubella:   RPR:    HBsAg:    HIV:    GBS:     Assessment/Plan: IUP at 34+ weeks with complications from attempted replacement of right nephrostomy tube last week with extravasation of contrast and increased chest pain and SOB with right pleural effusion. Baby appears fine.  I have spoken with Dr. Danise Mina of Pulmonary and someone will be by to see her this pm and she may need thoracentesis.  Will admit for observation to facilitate this.   [name] [name] D 12/30/2012, 5:05 PM

## 2012-12-30 NOTE — Progress Notes (Signed)
Dr. Herma Carson at bedside completing [place] of lung. MD discussed course of care with pt and husband at bedside.  Pt c/o pain, unrelieved by percocet 1-2tabs and stated Diluadid helped. Order for 0.5mg  Dilaudid Q3PRN pain per MD.  Reviewed course of care with pt and husband as for nursing/interventional. Pt verbalizes understanding. Will continue to monitor closely.

## 2012-12-30 NOTE — Progress Notes (Signed)
In to see pt in room 2C08. Pulmonologist at bedside. Pt without OB related complaints. Plan at this time to hold on any procedures until the morning. Explained to pt that I will return around 6am to do 30 min NST

## 2012-12-30 NOTE — Progress Notes (Deleted)
ANTIBIOTIC CONSULT NOTE - INITIAL  Pharmacy Consult for Zosyn Indication: Pneumonia  Allergies  Allergen Reactions  . Cinnamon Anaphylaxis and Hives  . Shellfish Allergy Anaphylaxis  . Amoxicillin-Pot Clavulanate Nausea And Vomiting  . Nickel Rash    Patient Measurements: Height: 5\' 2"  (157.5 cm) IBW/kg (Calculated) : 50.1   Vital Signs: Temp: 99.6 F (37.6 C) (09/15 1752) Temp src: Oral (09/15 1752) BP: 152/81 mmHg (09/15 1752) Pulse Rate: 119 (09/15 1752)  Labs:  Recent Labs  12/29/12 0225  WBC 16.6*  HGB 10.2*  PLT 325  CREATININE 0.57   The CrCl is unknown because both a height and weight (above a minimum accepted value) are required for this calculation. No results found for this basename: Rolm Gala, VANCORANDOM, GENTTROUGH, GENTPEAK, GENTRANDOM, TOBRATROUGH, TOBRAPEAK, TOBRARND, AMIKACINPEAK, AMIKACINTROU, AMIKACIN,  in the last 72 hours   Microbiology: Recent Results (from the past 720 hour(s))  URINE CULTURE     Status: None   Collection Time    12/25/12  2:53 PM      Result Value Range Status   Specimen Description     Final   Value: URINE, RANDOM     Performed at [facility]   Special Requests     Final   Value: NONE     Performed at [facility]   Culture  Setup Time     Final   Value: 12/25/2012 21:50     Performed at [facility]   Colony Count     Final   Value: NO GROWTH     Performed at [facility]   Culture     Final   Value: NO GROWTH     Performed at [facility]   Report Status 12/26/2012 FINAL   Final    Medical History: Past Medical History  Diagnosis Date  . Anxiety   . Bipolar 1 disorder   . Post traumatic stress disorder (PTSD)   . Chronic pain     muscle spasms- entire body  . Infection     UTI  . Fracture, ankle     left- x2  . Depression     doing fine now  . Hydronephrosis     Medications:   Assessment: 22 yo F 34+ weeks admitted  with chest pain and SOB. Pt has recently had right ureteral stent placed for right hydronephrosis.  Goal of Therapy:    Plan:  1. Zosyn 3.375 gram IV q8h (4 hour infusion). 2.Will continue to follow and watch renal function -- no dosage adjustment required for Zosyn.  Claybon Jabs 12/30/2012,6:05 PM

## 2012-12-30 NOTE — Consult Note (Signed)
PULMONARY  / CRITICAL CARE MEDICINE  Name: [name] [name] MRN: [id number] DOB: Jan 26, 1991    ADMISSION DATE:  12/30/2012 CONSULTATION DATE:  12/30/2012  REFERRING MD :  Tawanna Cooler Meisinger  CHIEF COMPLAINT: Short of breath  BRIEF PATIENT DESCRIPTION:  22 yo female in 34th week of pregnancy had Rt hydronephrosis s/p percutaneous nephrostomy tube.  She had attempted tube exchange on 9/09 >> complicated by contrast extravasation.  Developed progressive dyspnea, Rt pleuritic chest pain, hypoxia, and fever up to 103F.  Presented again to [facility] ER and found to have large rt pleural effusion.  PCCM consulted to assess pleural effusion.  SIGNIFICANT EVENTS: 9/09 unsuccessful percutaneous nephrostomy tube exchange 9/10 urology consulted, placement of Rt ureteral double J stent 9/11 oxygen desaturation noted >> CXR shows ATX 9/12 d/c home 9/15 present to [facility] ED with dyspnea >> Large Rt pleural effusion, PCCM consulted  STUDIES:   LINES / TUBES: 10/30/12 Rt nephrostomy tube  CULTURES: Blood 9/15 >>  ANTIBIOTICS: Rocephin 9/09 >> 9/11 Clindamycin 9/09 >> 9/09 Keflex 9/11 >> 9/15 Zosyn 9/15 >>  HISTORY OF PRESENT ILLNESS:   She has Rt hydronephrosis of pregnancy.  She had Rt nephrostomy tube in July.  She can in for tube exchange on 9/09, but this was not able to be replaced.  There was also concern for contrast extravasation at time of procedure.  She was then seen by urology, and had stent placement.  Pt reports feeling trouble with her breathing and rt pleuritic chest pain since stent placement.  She also has cough with sputum.  She has reported temp of 103F at home.  She denies sinus congestion or sore throat.  She has some nausea.  Her pain related kidney problems has improved.  PAST MEDICAL HISTORY :  Past Medical History  Diagnosis Date  . Anxiety   . Bipolar 1 disorder   . Post traumatic stress disorder (PTSD)   . Chronic pain     muscle spasms- entire body  . Infection     UTI  .  Fracture, ankle     left- x2  . Depression     doing fine now  . Hydronephrosis    Past Surgical History  Procedure Laterality Date  . No past surgeries    . Nephrostomy tube placement at 25wks and removed at 335/7 Right   . Cystoscopy with stent placement Right 12/25/2012    Procedure: CYSTOSCOPY WITH STENT PLACEMENT;  Surgeon: Marcine Matar, MD;  Location: [facility];  Service: Urology;  Laterality: Right;   Prior to Admission medications   Medication Sig Start Date End Date Taking? Authorizing Provider  cephALEXin (KEFLEX) 500 MG capsule Take 500 mg by mouth 3 (three) times daily.   Yes Historical Provider, MD  diphenhydrAMINE (BENADRYL) 25 mg capsule Take 25 mg by mouth every 6 (six) hours as needed for itching.   Yes Historical Provider, MD  docusate sodium (COLACE) 100 MG capsule Take 100 mg by mouth 3 (three) times daily as needed for constipation.   Yes Historical Provider, MD  ondansetron (ZOFRAN) 8 MG tablet Take 8 mg by mouth every 8 (eight) hours as needed for nausea.   Yes Historical Provider, MD  oxybutynin (DITROPAN) 5 MG tablet Take 5 mg by mouth 3 (three) times daily.   Yes Historical Provider, MD  oxyCODONE-acetaminophen (PERCOCET) 10-325 MG per tablet Take 1 tablet by mouth every 4 (four) hours as needed for pain.   Yes Historical Provider, MD  pantoprazole (PROTONIX) 40 MG tablet  Take 40 mg by mouth daily.   Yes Historical Provider, MD  ranitidine (ZANTAC) 75 MG tablet Take 75 mg by mouth daily.   Yes Historical Provider, MD   Allergies  Allergen Reactions  . Cinnamon Anaphylaxis and Hives  . Shellfish Allergy Anaphylaxis  . Amoxicillin-Pot Clavulanate Nausea And Vomiting  . Nickel Rash    FAMILY HISTORY:  Family History  Problem Relation Age of Onset  . Heart disease Mother   . Hypertension Father   . Cancer Paternal Grandmother     lung  . Heart disease Paternal Grandmother    SOCIAL HISTORY:  reports that she has been smoking Cigarettes.  She has been  smoking about 0.25 packs per day. She has never used smokeless tobacco. She reports that  drinks alcohol. She reports that she does not use illicit drugs.  REVIEW OF SYSTEMS:   Negative except above.  SUBJECTIVE:   VITAL SIGNS: Temp:  [99 F (37.2 C)] 99 F (37.2 C) (09/15 1520) Pulse Rate:  [119] 119 (09/15 1520) Resp:  [30] 30 (09/15 1520) BP: (131)/(91) 131/91 mmHg (09/15 1520) SpO2:  [96 %] 96 % (09/15 1520) Room air  PHYSICAL EXAMINATION: General: Tachypnea, speaks in full sentences Neuro:  Alert, normal strength, moves all extremities HEENT:  No sinus tenderness, no oral exudate, no LAN Cardiovascular:  s1s2 regular, tachycardic Lungs:  Decreased breath sounds on Rt 2/3 way up, no wheeze, basilar rales on Lt Abdomen:  Gravid uterus, non tender Musculoskeletal:  1+ ankle edema Skin:  Multiple tatoos, no rashes  Labs: CBC Recent Labs     12/29/12  0225  WBC  16.6*  HGB  10.2*  HCT  31.7*  PLT  325    Coag's No results found for this basename: APTT, INR,  in the last 72 hours  BMET Recent Labs     12/29/12  0225  NA  134*  K  4.1  CL  99  CO2  25  BUN  4*  CREATININE  0.57  GLUCOSE  91    Electrolytes Recent Labs     12/29/12  0225  CALCIUM  9.5    Sepsis Markers No results found for this basename: LACTICACIDVEN, PROCALCITON, O2SATVEN,  in the last 72 hours  ABG No results found for this basename: PHART, PCO2ART, PO2ART,  in the last 72 hours  Liver Enzymes Recent Labs     12/29/12  0225  AST  10  ALT  7  ALKPHOS  179*  BILITOT  0.2*  ALBUMIN  2.4*    Cardiac Enzymes No results found for this basename: TROPONINI, PROBNP,  in the last 72 hours  Glucose No results found for this basename: GLUCAP,  in the last 72 hours  Imaging Dg Chest 2 View  12/30/2012   *RADIOLOGY REPORT*  Clinical Data: Chest pain  CHEST - 2 VIEW  Comparison: 12/26/2012  Findings: Increase in size of large right pleural effusion.  There is progressive  consolidation / atelectasis in the right   mid and lower lung.  Left lung clear.  Heart size stable.  Regional bones unremarkable.  IMPRESSION:  Enlarging large right pleural effusion and associated right lung atelectasis/consolidation.   Original Report Authenticated By: D. Andria Rhein, MD      ASSESSMENT / PLAN:  PULMONARY: A: Right pleural effusion >> concern for aspiration pneumonia and parapneumonic effusion versus possible diaphragm injury and bleeding. P: -transfer to [facility] SDU -d/w intervention radiology >> will get chest u/s to further  assess pleural space and then proceed with fluid sampling/drainage -depending on findings of pleural fluid assessment she may also need thoracic surgery evaluation -try to limit chest imaging studies >> d/w ob-gyn, and should be okay to do CT chest with abdominal shield at this stage of pregnancy if needed -oxygen as needed to keep SpO2 > 94%  CARDIAC A: SIRS/sepsis w/o septic shock. P: -monitor hemodynamics -check procalcitonin and lactic acid >> defer CVL placement for now  RENAL A: Rt hydronephrosis of pregnancy s/p Rt ureteral stent. P: -monitor renal fx, urine outpt, electrolytes  HEMATOLOGY A:  Leukocytosis. Anemia of pregnancy. P: -f/u CBC -SCD for DVT prevention  GASTROENTEROLOGY A: Reflux in pregnancy. P: -protonix   OBSTETRICS A: 34 week pregnancy. P: -OB-GYN will assist with care while pt is in [facility]  INFECTION A: Possible aspiration versus hospital acquired pneumonia. P: -start vancomycin, zosyn -send blood cultures, and pleural fluid culture when available  ENDOCRINE A: No Issues. P: -monitor blood sugars on BMET  NEUROLOGY A: Pain control. P: -prn tylenol, percocet  Plan d/w Dr. Lowella Dandy with IR and Dr. Jackelyn Knife Will transfer to [facility] step down unit under PCCM service to expedite assessment of pleural fluid, especially if she may need thoracic surgery assessment.  Coralyn Helling, MD [facility]  Pulmonary/Critical Care 12/30/2012, 6:17 PM Pager:  [phone] After 3pm call: [phone]

## 2012-12-30 NOTE — MAU Note (Signed)
Pt sent over from office for dye that is leaking into her abdomen and chest from the kidney scan. Pt having pain in her right upper abdomen, chest and shoulder.  Was sent to receive chest xray and fetal monitoring

## 2012-12-30 NOTE — MAU Provider Note (Signed)
History     CSN: [id number]  Arrival date and time: 12/30/12 1505   None     No chief complaint on file.  HPI This is a 22 y.o. female at [redacted]w[redacted]d who presents for evaluation of a known extravasation of contrast media into her right chest cavity during a nephrostogram.  She has been followed since then and hospitalized. Today her pain increased.  Complains of increased shortness of breath, pain and mild contractions.   OB History   Grav Para Term Preterm Abortions TAB SAB Ect Mult Living   1 0 0 0 0 0 0 0 0 0       Past Medical History  Diagnosis Date  . Anxiety   . Bipolar 1 disorder   . Post traumatic stress disorder (PTSD)   . Chronic pain     muscle spasms- entire body  . Infection     UTI  . Fracture, ankle     left- x2  . Depression     doing fine now  . Hydronephrosis     Past Surgical History  Procedure Laterality Date  . No past surgeries    . Nephrostomy tube placement at 25wks and removed at 335/7 Right   . Cystoscopy with stent placement Right 12/25/2012    Procedure: CYSTOSCOPY WITH STENT PLACEMENT;  Surgeon: Marcine Matar, MD;  Location: [facility];  Service: Urology;  Laterality: Right;    Family History  Problem Relation Age of Onset  . Heart disease Mother   . Hypertension Father   . Cancer Paternal Grandmother     lung  . Heart disease Paternal Grandmother     History  Substance Use Topics  . Smoking status: Current Some Day Smoker -- 0.25 packs/day    Types: Cigarettes  . Smokeless tobacco: Never Used     Comment: has cut down  . Alcohol Use: Yes     Comment: occasionally-not now    Allergies:  Allergies  Allergen Reactions  . Cinnamon Anaphylaxis and Hives  . Shellfish Allergy Anaphylaxis  . Amoxicillin-Pot Clavulanate Nausea And Vomiting  . Nickel Rash    Prescriptions prior to admission  Medication Sig Dispense Refill  . cephALEXin (KEFLEX) 500 MG capsule Take 500 mg by mouth 3 (three) times daily.      . diphenhydrAMINE  (BENADRYL) 25 mg capsule Take 25 mg by mouth every 6 (six) hours as needed for itching.      . docusate sodium (COLACE) 100 MG capsule Take 100 mg by mouth 3 (three) times daily as needed for constipation.      . ondansetron (ZOFRAN) 8 MG tablet Take 8 mg by mouth every 8 (eight) hours as needed for nausea.      Marland Kitchen oxybutynin (DITROPAN) 5 MG tablet Take 5 mg by mouth 3 (three) times daily.      Marland Kitchen oxyCODONE-acetaminophen (PERCOCET) 10-325 MG per tablet Take 1 tablet by mouth every 4 (four) hours as needed for pain.      . pantoprazole (PROTONIX) 40 MG tablet Take 40 mg by mouth daily.      . ranitidine (ZANTAC) 75 MG tablet Take 75 mg by mouth daily.        Review of Systems  Constitutional: Positive for malaise/fatigue. Negative for fever and chills.  Respiratory: Positive for shortness of breath.        Pleuritic pain   Cardiovascular: Positive for chest pain.  Gastrointestinal: Positive for abdominal pain. Negative for nausea and vomiting.  Neurological: Positive for  weakness. Negative for headaches.   Physical Exam   Blood pressure 131/91, pulse 119, temperature 99 F (37.2 C), temperature source Oral, resp. rate 30, height 5\' 2"  (1.575 m), last menstrual period 05/13/2012, SpO2 96.00%.  Physical Exam  Constitutional: She is oriented to person, place, and time. She appears well-developed and well-nourished. No distress (but very uncomfortable).  HENT:  Head: Normocephalic.  Cardiovascular: Normal heart sounds.  Exam reveals no gallop and no friction rub.   No murmur heard. Tachycardic   Respiratory: Effort normal. No respiratory distress. She has no wheezes. She has no rales. She exhibits tenderness.  Absent breath sounds right base, decreased left base   GI: Soft. There is tenderness (upper abdomen).  Fetal heart rate reassuring with uterine irritability  Musculoskeletal: Normal range of motion.  Neurological: She is alert and oriented to person, place, and time.  Skin: Skin  is warm and dry.  Psychiatric: She has a normal mood and affect.  O2 sat 96%  MAU Course  Procedures  MDM Dr Jackelyn Knife updated on test results Will give analgesia.    Assessment and Plan  A:  [facility] at [redacted]w[redacted]d       Chest pain due to lung irritant      Uterine irritability      Stable respirations      Right pleural effusion  P:  Per Dr Jackelyn Knife, will consult Pulmonology  [facility] 12/30/2012, 4:23 PM

## 2012-12-30 NOTE — Progress Notes (Signed)
Md notified that pt had complainted of falling and hitting hip yesterday.  Pt denies hitting her abd and denies any ROM or VB.  FHR reassurring and no significant uterine activity noted.  Md with no orders at this time.

## 2012-12-30 NOTE — Progress Notes (Signed)
ANTIBIOTIC CONSULT NOTE - INITIAL  Pharmacy Consult for Vancomycin and Zosyn Indication: Pneumonia  Allergies  Allergen Reactions  . Cinnamon Anaphylaxis and Hives  . Shellfish Allergy Anaphylaxis  . Amoxicillin-Pot Clavulanate Nausea And Vomiting  . Nickel Rash    Patient Measurements: Height: 5\' 2"  (157.5 cm) Weight: 201 lb 4.8 oz (91.309 kg) IBW/kg (Calculated) : 50.1   Vital Signs: Temp: 99.6 F (37.6 C) (09/15 1752) Temp src: Oral (09/15 1752) BP: 152/81 mmHg (09/15 1752) Pulse Rate: 119 (09/15 1752) Intake/Output from previous day:   Intake/Output from this shift:    Labs:  Recent Labs  12/29/12 0225 12/30/12 1740  WBC 16.6* 20.5*  HGB 10.2* 10.2*  PLT 325 341  CREATININE 0.57  --    Estimated Creatinine Clearance: 115 ml/min (by C-G formula based on Cr of 0.57). No results found for this basename: Rolm Gala, VANCORANDOM, GENTTROUGH, GENTPEAK, GENTRANDOM, TOBRATROUGH, TOBRAPEAK, TOBRARND, AMIKACINPEAK, AMIKACINTROU, AMIKACIN,  in the last 72 hours   Microbiology: Recent Results (from the past 720 hour(s))  URINE CULTURE     Status: None   Collection Time    12/25/12  2:53 PM      Result Value Range Status   Specimen Description     Final   Value: URINE, RANDOM     Performed at [facility]   Special Requests     Final   Value: NONE     Performed at [facility]   Culture  Setup Time     Final   Value: 12/25/2012 21:50     Performed at [facility]   Colony Count     Final   Value: NO GROWTH     Performed at [facility]   Culture     Final   Value: NO GROWTH     Performed at [facility]   Report Status 12/26/2012 FINAL   Final    Medical History: Past Medical History  Diagnosis Date  . Anxiety   . Bipolar 1 disorder   . Post traumatic stress disorder (PTSD)   . Chronic pain     muscle spasms- entire body  . Infection     UTI  . Fracture, ankle     left- x2  .  Depression     doing fine now  . Hydronephrosis     Medications:   Assessment: 22yo F at 34+ weeks gestation admitted for chest pain, dyspnea and fever. Pt has right hydronephrosis of pregnancy s/p ureteral stent.   Goal of Therapy:  Vancomycin trough 15-35mcg/ml  Plan:  1. Zosyn 3.375 gram IV q8h (4 hour infusion). 2. Vancomycin 1 gram IV q8h. 3. Will continue to monitor and check Vancomycin trough after 4th dose based on clinical status of patient  Claybon Jabs 12/30/2012,7:16 PM

## 2012-12-31 ENCOUNTER — Inpatient Hospital Stay (HOSPITAL_COMMUNITY): Payer: Medicaid Other

## 2012-12-31 ENCOUNTER — Observation Stay (HOSPITAL_COMMUNITY): Payer: Medicaid Other

## 2012-12-31 DIAGNOSIS — J90 Pleural effusion, not elsewhere classified: Secondary | ICD-10-CM

## 2012-12-31 LAB — MRSA PCR SCREENING: MRSA by PCR: NEGATIVE

## 2012-12-31 LAB — VANCOMYCIN, TROUGH: Vancomycin Tr: 8.9 ug/mL — ABNORMAL LOW (ref 10.0–20.0)

## 2012-12-31 MED ORDER — SODIUM CHLORIDE 0.9 % IV SOLN
INTRAVENOUS | Status: DC
  Administered 2012-12-31 – 2013-01-02 (×3): via INTRAVENOUS

## 2012-12-31 MED ORDER — PANTOPRAZOLE SODIUM 40 MG PO TBEC
40.0000 mg | DELAYED_RELEASE_TABLET | Freq: Every day | ORAL | Status: DC
  Administered 2012-12-31 – 2013-01-02 (×3): 40 mg via ORAL
  Filled 2012-12-31 (×4): qty 1

## 2012-12-31 MED ORDER — OXYBUTYNIN CHLORIDE 5 MG PO TABS
5.0000 mg | ORAL_TABLET | Freq: Three times a day (TID) | ORAL | Status: DC | PRN
  Administered 2012-12-31 – 2013-01-02 (×7): 5 mg via ORAL
  Filled 2012-12-31 (×9): qty 1

## 2012-12-31 MED ORDER — VANCOMYCIN HCL 10 G IV SOLR
1500.0000 mg | Freq: Three times a day (TID) | INTRAVENOUS | Status: DC
  Administered 2012-12-31 – 2013-01-02 (×5): 1500 mg via INTRAVENOUS
  Filled 2012-12-31 (×7): qty 1500

## 2012-12-31 NOTE — Procedures (Signed)
Attempted US guided right thoracentesis. Yielded no fluid. Pt tolerated procedure well. No immediate complications.  CXR ordered. Patient is pregnant and will be shielded.   Pattricia Boss D PA-C 12/31/2012 10:33 AM

## 2012-12-31 NOTE — Progress Notes (Addendum)
PULMONARY  / CRITICAL CARE MEDICINE  Name: [name] [name] MRN: [id number] DOB: 1990/08/30    ADMISSION DATE:  12/30/2012 CONSULTATION DATE:  12/30/2012  REFERRING MD :  Tawanna Cooler Meisinger  CHIEF COMPLAINT: Short of breath  BRIEF PATIENT DESCRIPTION:  22 y/o female in 34th week of pregnancy had Rt hydronephrosis s/p percutaneous nephrostomy tube.  She had attempted tube exchange on 9/09 >> complicated by contrast extravasation.  Developed progressive dyspnea, Rt pleuritic chest pain, hypoxia, and fever up to 103F.  Presented again to [facility] ER and found to have large rt pleural effusion.  PCCM consulted to assess pleural effusion.  SIGNIFICANT EVENTS: 9/09 - unsuccessful percutaneous nephrostomy tube exchange 9/10 - urology consulted, placement of Rt ureteral double J stent 9/11 - oxygen desaturation noted >> CXR shows ATX 9/12 - d/c home 9/15 - present to [facility] ED with dyspnea >> Large Rt pleural effusion, PCCM consulted 9/16 - unable to perform thora, not enough fluid per IR  STUDIES:  9/16 CT chest >>  LINES / TUBES: 10/30/12 Rt nephrostomy tube >> out  CULTURES: Blood 9/15 >>  ANTIBIOTICS: Rocephin 9/09 >> 9/11 Clindamycin 9/09 >> 9/09 Keflex 9/11 >> 9/15 Zosyn 9/15 >> Vancomycin 9/15 >>   SUBJECTIVE: Breathing better.  Decreased chest discomfort.    VITAL SIGNS: Temp:  [98.4 F (36.9 C)-99.6 F (37.6 C)] 98.6 F (37 C) (09/16 0840) Pulse Rate:  [91-140] 91 (09/16 0840) Resp:  [16-30] 22 (09/16 0840) BP: (131-158)/(68-104) 158/104 mmHg (09/16 0840) SpO2:  [90 %-100 %] 97 % (09/16 0840) Weight:  [201 lb 4.8 oz (91.309 kg)-202 lb 9.6 oz (91.9 kg)] 202 lb 9.6 oz (91.9 kg) (09/15 2126) Room air  PHYSICAL EXAMINATION: General: Tachypnea, speaks in full sentences.  Neuro:  Alert, normal strength, moves all extremities HEENT:  No sinus tenderness, some clear sinus drainage,- JVD Cardiovascular:  s1s2 regular, tachycardic Lungs:  Decreased breath sounds on Rt 2/3 way up, no  wheeze, basilar rales on Lt, RA the majority of the time - requires O2 with activity Abdomen:  Gravid uterus, non tender Musculoskeletal: no ankle edema noted Skin:  Multiple tatoos, no rashes  Labs: CBC Recent Labs     12/29/12  0225  12/30/12  1740  WBC  16.6*  20.5*  HGB  10.2*  10.2*  HCT  31.7*  30.5*  PLT  325  341   BMET Recent Labs     12/29/12  0225  NA  134*  K  4.1  CL  99  CO2  25  BUN  4*  CREATININE  0.57  GLUCOSE  91   Electrolytes Recent Labs     12/29/12  0225  CALCIUM  9.5    Sepsis Markers Recent Labs     12/30/12  1815  PROCALCITON  0.11   Liver Enzymes Recent Labs     12/29/12  0225  AST  10  ALT  7  ALKPHOS  179*  BILITOT  0.2*  ALBUMIN  2.4*   Imaging Dg Chest 2 View  12/30/2012   *RADIOLOGY REPORT*  Clinical Data: Chest pain  CHEST - 2 VIEW  Comparison: 12/26/2012  Findings: Increase in size of large right pleural effusion.  There is progressive consolidation / atelectasis in the right   mid and lower lung.  Left lung clear.  Heart size stable.  Regional bones unremarkable.  IMPRESSION:  Enlarging large right pleural effusion and associated right lung atelectasis/consolidation.   Original Report Authenticated By: D. Hassell III,  MD      ASSESSMENT / PLAN:  PULMONARY: A: Rt sided ASD and effusion >> not enough fluid to warrant thoracentesis on 9/16. P: -will get non contrast CT chest to further assess >> d/w OB-GYN, and okay to do procedure with abdominal shiedl -oxygen as needed to keep SpO2 > 94%  CARDIAC A: SIRS/sepsis w/o septic shock -procalcitonin 0.11 on 9/15 and lactic acid 1.1 on 9/15  P: -monitor hemodynamics -NS @50  ml /hr  RENAL A: Rt hydronephrosis of pregnancy s/p Rt ureteral stent P: -monitor renal fx, urine outpt, electrolytes  HEMATOLOGY A:  Leukocytosis >> likely related to infection Anemia of pregnancy. P: -f/u CBC 9/17 -SCD for DVT prevention  GASTROENTEROLOGY A: Reflux in  pregnancy. Constipation P: -protonix  -tums as needed -Miralax prn  OBSTETRICS A: 34 week pregnancy. P: -OB-GYN will assist with care while pt is in [facility]  INFECTION A: Possible aspiration versus hospital acquired pneumonia. P: -D2/x vancomycin, zosyn -f/u blood cx  ENDOCRINE A: No acute issues. P: -Monitor blood sugar on BMET  NEUROLOGY A: Pain control. P: -prn tylenol, percocet, dilaudid  Plan d/w Dr. Jackelyn Knife.  Updated pt's family at bedside.  Scribed by Kandice Robinsons, RN, Student ACNP, [employer]-CON for Canary Brim, NP-C  Canary Brim, NP-C [facility] Pgr: [phone] or [phone]  Coralyn Helling, MD [facility] 12/31/2012, 1:22 PM Pager:  [phone] After 3pm call: [phone]

## 2012-12-31 NOTE — Progress Notes (Signed)
Pt visited by Dr. Dorris Fetch [phone] @ 1700 to discuss possible "lung surgery". Nurse trying to get more information on s/p plan for pt who has had cycles of anxiety through the day, during which she finds it difficult to breath.

## 2012-12-31 NOTE — Progress Notes (Signed)
ANTIBIOTIC CONSULT NOTE - FOLLOW UP  Pharmacy Consult for Vancomycin Indication: pneumonia  Allergies  Allergen Reactions  . Cinnamon Anaphylaxis and Hives  . Shellfish Allergy Anaphylaxis  . Amoxicillin-Pot Clavulanate Nausea And Vomiting  . Nickel Rash    Patient Measurements: Height: 5\' 2"  (157.5 cm) Weight: 202 lb 9.6 oz (91.9 kg) IBW/kg (Calculated) : 50.1 Adjusted Body Weight:   Vital Signs: Temp: 99.4 F (37.4 C) (09/16 2032) Temp src: Oral (09/16 2032) BP: 134/77 mmHg (09/16 2032) Pulse Rate: 105 (09/16 2032) Intake/Output from previous day: 09/15 0701 - 09/16 0700 In: 1068 [P.O.:118; I.V.:450; IV Piggyback:500] Out: 600 [Urine:600] Intake/Output from this shift: Total I/O In: -  Out: 200 [Urine:200]  Labs:  Recent Labs  12/29/12 0225 12/30/12 1740  WBC 16.6* 20.5*  HGB 10.2* 10.2*  PLT 325 341  CREATININE 0.57  --    Estimated Creatinine Clearance: 115.3 ml/min (by C-G formula based on Cr of 0.57).  Recent Labs  12/31/12 1912  VANCOTROUGH 8.9*     Microbiology: Recent Results (from the past 720 hour(s))  URINE CULTURE     Status: None   Collection Time    12/25/12  2:53 PM      Result Value Range Status   Specimen Description     Final   Value: URINE, RANDOM     Performed at [facility]   Special Requests     Final   Value: NONE     Performed at [facility]   Culture  Setup Time     Final   Value: 12/25/2012 21:50     Performed at [facility]   Colony Count     Final   Value: NO GROWTH     Performed at [facility]   Culture     Final   Value: NO GROWTH     Performed at [facility]   Report Status 12/26/2012 FINAL   Final  CULTURE, BLOOD (ROUTINE X 2)     Status: None   Collection Time    12/30/12  5:40 PM      Result Value Range Status   Specimen Description BLOOD LEFT ARM   Final   Special Requests     Final   Value: BOTTLES DRAWN AEROBIC AND ANAEROBIC 6CC EACH  BOTTLE   Culture  Setup Time     Final   Value: 12/30/2012 22:28     Performed at [facility]   Culture     Final   Value:        BLOOD CULTURE RECEIVED NO GROWTH TO DATE CULTURE WILL BE HELD FOR 5 DAYS BEFORE ISSUING A FINAL NEGATIVE REPORT     Performed at [facility]   Report Status PENDING   Incomplete  CULTURE, BLOOD (ROUTINE X 2)     Status: None   Collection Time    12/30/12  5:50 PM      Result Value Range Status   Specimen Description BLOOD RIGHT ARM   Final   Special Requests BOTTLES DRAWN AEROBIC ONLY 3CC   Final   Culture  Setup Time     Final   Value: 12/30/2012 22:28     Performed at [facility]   Culture     Final   Value:        BLOOD CULTURE RECEIVED NO GROWTH TO DATE CULTURE WILL BE HELD FOR 5 DAYS BEFORE ISSUING A FINAL NEGATIVE REPORT  Performed at [facility]   Report Status PENDING   Incomplete  MRSA PCR SCREENING     Status: None   Collection Time    12/30/12  9:21 PM      Result Value Range Status   MRSA by PCR NEGATIVE  NEGATIVE Final   Comment:            The GeneXpert MRSA Assay (FDA     approved for NASAL specimens     only), is one component of a     comprehensive MRSA colonization     surveillance program. It is not     intended to diagnose MRSA     infection nor to guide or     monitor treatment for     MRSA infections.    Anti-infectives   Start     Dose/Rate Route Frequency Ordered Stop   12/31/12 2200  vancomycin (VANCOCIN) 1,500 mg in sodium chloride 0.9 % 500 mL IVPB     1,500 mg 250 mL/hr over 120 Minutes Intravenous Every 8 hours 12/31/12 2054     12/30/12 2000  vancomycin (VANCOCIN) IVPB 1000 mg/200 mL premix  Status:  Discontinued     1,000 mg 200 mL/hr over 60 Minutes Intravenous Every 8 hours 12/30/12 1911 12/31/12 2054   12/30/12 1830  piperacillin-tazobactam (ZOSYN) IVPB 3.375 g     3.375 g 12.5 mL/hr over 240 Minutes Intravenous Every 8 hours 12/30/12 1801        Assessment: Pt is  a 22 yr old female who is [redacted] weeks pregnant. She developed dyspnea, chest pain and fever following a treatment for hydronephrosis. She is now being treated with Zosyn and Vancomycin. A vanc trough came back at 8.9, much lower than goal.  Goal of Therapy:  Vancomycin trough level 15-20 mcg/ml  Plan:  Will increase the vancomycin dose from 1000 mg q8h to 1500 mg IV q8h. Will F/U with another trough measurement when appropriate.  Eugene Garnet 12/31/2012,8:57 PM

## 2012-12-31 NOTE — Progress Notes (Signed)
OB [redacted]W[redacted]D Feels a little better, some fetal movement, no significant ctx, no leakage of fluid or bleeding Afeb, VSS except still a little tachy 110s Fundus NT FHT- reactive Continue care per pulmonary, continue fetal monitoring q shift, will follow and be available.  Dr. Ellyn Hack on call today, cell [phone]

## 2012-12-31 NOTE — Consult Note (Signed)
Reason for Consult:Right pleural effusion Referring Physician: Dr. Sood  [name] [name] is an 22 y.o. female.  HPI:  [name] [name] is a 22 y/o female who is [redacted] weeks pregnant. During her pregnancy she developed a right hydronephrosis treated with a percutaneous nephrostomy tube. She had an attempted tube exchange on 9/9, this could not be completed and during the procedure there was contrast extravasation into the right pleura. She went home on 9/12. But over the next 3 days she developed progressive dyspnea, pleuritic chest pain, and fever up to 103F. She presented to the [facility] ER and a CXR showed a  large right pleural effusion. An attempt was made to drain this percutaneously, but no fluid could be withdrawn. She says her shortness of breath has resolved and the pain has improved since admission.  A CT of the chest was done today which showed a loculated right pleural effusion with compressive atelectasis v pneumonia in the right lung base. She is currently on Vancomycin and zosyn.   Past Medical History  Diagnosis Date  . Anxiety   . Chronic pain     muscle spasms- entire body  . Infection     UTI  . Fracture, ankle     left- x2  . Hydronephrosis   . Bipolar 1 disorder   . Post traumatic stress disorder (PTSD)   . Depression     doing fine now  . OCD (obsessive compulsive disorder)     Past Surgical History  Procedure Laterality Date  . No past surgeries    . Nephrostomy tube placement at 25wks and removed at 335/7 Right   . Cystoscopy with stent placement Right 12/25/2012    Procedure: CYSTOSCOPY WITH STENT PLACEMENT;  Surgeon: Stephen Dahlstedt, MD;  Location: [facility];  Service: Urology;  Laterality: Right;    Family History  Problem Relation Age of Onset  . Heart disease Mother   . Hypertension Father   . Cancer Paternal Grandmother     lung  . Heart disease Paternal Grandmother     Social History:  reports that she has been smoking Cigarettes.  She has been smoking about 0.25  packs per day. She has never used smokeless tobacco. She reports that  drinks alcohol. She reports that she does not use illicit drugs.  Allergies:  Allergies  Allergen Reactions  . Cinnamon Anaphylaxis and Hives  . Shellfish Allergy Anaphylaxis  . Amoxicillin-Pot Clavulanate Nausea And Vomiting  . Nickel Rash    Medications:  Prior to Admission:  Prescriptions prior to admission  Medication Sig Dispense Refill  . cephALEXin (KEFLEX) 500 MG capsule Take 500 mg by mouth 3 (three) times daily.      . diphenhydrAMINE (BENADRYL) 25 mg capsule Take 25 mg by mouth every 6 (six) hours as needed for itching.      . docusate sodium (COLACE) 100 MG capsule Take 100 mg by mouth 3 (three) times daily as needed for constipation.      . ondansetron (ZOFRAN) 8 MG tablet Take 8 mg by mouth every 8 (eight) hours as needed for nausea.      . oxybutynin (DITROPAN) 5 MG tablet Take 5 mg by mouth 3 (three) times daily.      . oxyCODONE-acetaminophen (PERCOCET) 10-325 MG per tablet Take 1 tablet by mouth every 4 (four) hours as needed for pain.      . pantoprazole (PROTONIX) 40 MG tablet Take 40 mg by mouth daily.      . ranitidine (  ZANTAC) 75 MG tablet Take 75 mg by mouth daily.        Results for orders placed during the hospital encounter of 12/30/12 (from the past 48 hour(s))  CBC     Status: Abnormal   Collection Time    12/30/12  5:40 PM      Result Value Range   WBC 20.5 (*) 4.0 - 10.5 K/uL   RBC 4.12  3.87 - 5.11 MIL/uL   Hemoglobin 10.2 (*) 12.0 - 15.0 g/dL   HCT 30.5 (*) 36.0 - 46.0 %   MCV 74.0 (*) 78.0 - 100.0 fL   MCH 24.8 (*) 26.0 - 34.0 pg   MCHC 33.4  30.0 - 36.0 g/dL   RDW 14.0  11.5 - 15.5 %   Platelets 341  150 - 400 K/uL  CULTURE, BLOOD (ROUTINE X 2)     Status: None   Collection Time    12/30/12  5:40 PM      Result Value Range   Specimen Description BLOOD LEFT ARM     Special Requests       Value: BOTTLES DRAWN AEROBIC AND ANAEROBIC 6CC EACH BOTTLE   Culture  Setup Time        Value: 12/30/2012 22:28     Performed at [facility]   Culture       Value:        BLOOD CULTURE RECEIVED NO GROWTH TO DATE CULTURE WILL BE HELD FOR 5 DAYS BEFORE ISSUING A FINAL NEGATIVE REPORT     Performed at [facility]   Report Status PENDING    CULTURE, BLOOD (ROUTINE X 2)     Status: None   Collection Time    12/30/12  5:50 PM      Result Value Range   Specimen Description BLOOD RIGHT ARM     Special Requests BOTTLES DRAWN AEROBIC ONLY 3CC     Culture  Setup Time       Value: 12/30/2012 22:28     Performed at [facility]   Culture       Value:        BLOOD CULTURE RECEIVED NO GROWTH TO DATE CULTURE WILL BE HELD FOR 5 DAYS BEFORE ISSUING A FINAL NEGATIVE REPORT     Performed at [facility]   Report Status PENDING    PROCALCITONIN     Status: None   Collection Time    12/30/12  6:15 PM      Result Value Range   Procalcitonin 0.11     Comment:            Interpretation:     PCT (Procalcitonin) <= 0.5 ng/mL:     Systemic infection (sepsis) is not likely.     Local bacterial infection is possible.     (NOTE)             ICU PCT Algorithm               Non ICU PCT Algorithm        ----------------------------     ------------------------------             PCT < 0.25 ng/mL                 PCT < 0.1 ng/mL         Stopping of antibiotics            Stopping of antibiotics             strongly encouraged.               strongly encouraged.        ----------------------------     ------------------------------           PCT level decrease by               PCT < 0.25 ng/mL           >= 80% from peak PCT           OR PCT 0.25 - 0.5 ng/mL          Stopping of antibiotics                                                 encouraged.         Stopping of antibiotics               encouraged.        ----------------------------     ------------------------------           PCT level decrease by              PCT >= 0.25 ng/mL           < 80% from  peak PCT            AND PCT >= 0.5 ng/mL            Continuing antibiotics                                                  encouraged.           Continuing antibiotics                encouraged.        ----------------------------     ------------------------------         PCT level increase compared          PCT > 0.5 ng/mL             with peak PCT AND              PCT >= 0.5 ng/mL             Escalation of antibiotics                                              strongly encouraged.          Escalation of antibiotics            strongly encouraged.     Performed at [facility]  LACTIC ACID, PLASMA     Status: None   Collection Time    12/30/12  6:15 PM      Result Value Range   Lactic Acid, Venous 1.1  0.5 - 2.2 mmol/L   Comment: Performed at [facility]  MRSA PCR SCREENING     Status: None   Collection Time    12/30/12  9:21 PM      Result Value Range   MRSA by PCR NEGATIVE  NEGATIVE     Comment:            The GeneXpert MRSA Assay (FDA     approved for NASAL specimens     only), is one component of a     comprehensive MRSA colonization     surveillance program. It is not     intended to diagnose MRSA     infection nor to guide or     monitor treatment for     MRSA infections.    Dg Chest 1 View  12/31/2012   CLINICAL DATA:  Post thoracentesis.  EXAM: CHEST - 1 VIEW  COMPARISON:  12/30/2012  FINDINGS: Large right pleural effusion persists, not significantly changed. No visible pneumothorax. Right lower lobe atelectasis or consolidation. Cardiomegaly. No confluent opacity on the left.  IMPRESSION: Continued large right pleural effusion. No pneumothorax following thoracentesis.   Electronically Signed   By: Kevin  Dover M.D.   On: 12/31/2012 11:35   Dg Chest 2 View  12/30/2012   *RADIOLOGY REPORT*  Clinical Data: Chest pain  CHEST - 2 VIEW  Comparison: 12/26/2012  Findings: Increase in size of large right pleural effusion.  There is progressive consolidation /  atelectasis in the right   mid and lower lung.  Left lung clear.  Heart size stable.  Regional bones unremarkable.  IMPRESSION:  Enlarging large right pleural effusion and associated right lung atelectasis/consolidation.   Original Report Authenticated By: D. Hassell III, MD   Ct Chest Wo Contrast  12/31/2012   CLINICAL DATA:  Thirty-four weeks pregnant status post recent attempted right thoracentesis. Evaluate pleural effusion and airspace opacity.  EXAM: CT CHEST WITHOUT CONTRAST  TECHNIQUE: Multidetector CT imaging of the chest was performed following the standard protocol without IV contrast.  COMPARISON:  Abdominal pelvic CT 10/23/2012. Chest radiographs 12/30/2012 and 12/31/2012.  FINDINGS: A moderate size residual right pleural effusion appears partially loculated posteriorly and laterally. There is no pneumothorax. There is no significant left pleural effusion or pericardial effusion. There is subtotal opacification of the right middle and lower lobes with associated air bronchograms. There is peripheral atelectasis in the right upper lobe adjacent to the pleural effusion. There is mild left lower lobe atelectasis. No endobronchial lesion or dominant mass is apparent on non contrast imaging.  There is elevation of the right hemidiaphragm with resulting mild mediastinal shift to the left. No enlarged mediastinal or hilar lymph nodes are identified.  The visualized upper abdomen appears unremarkable. There are no worrisome osseous findings.  IMPRESSION: 1. Residual right pleural effusion is partially loculated posteriorly and laterally status post recent attempted thoracentesis. No pneumothorax.  2. Underlying nonspecific right lung airspace opacities with air bronchograms in the right middle and lower lobes. No lung mass is visualized on non contrast imaging.   Electronically Signed   By: Bill  Veazey   On: 12/31/2012 15:46   Us Thoracentesis Asp Pleural Space W/img Guide  12/31/2012   CLINICAL DATA:   Right-sided pleural effusion.  EXAM: ULTRASOUND GUIDED right THORACENTESIS  COMPARISON:  None.  FINDINGS: A total of approximately 0cc of fluid was removed.  IMPRESSION: Attempted ultrasound guided thoracentesis yielding no pleural fluid.  Read By: Koreen Morgan PA-C  PROCEDURE: An ultrasound guided thoracentesis was thoroughly discussed with the patient and questions answered. The benefits, risks, alternatives and complications were also discussed. The patient understands and wishes to proceed with the procedure. Written consent was obtained.  Ultrasound was performed to localize and mark an adequate pocket of fluid in the right chest. The   area was then prepped and draped in the normal sterile fashion. 1% Lidocaine was used for local anesthesia. Under ultrasound guidance a 19 gauge Yueh catheter was introduced. Thoracentesis was performed. The catheter was removed and a dressing applied.  Complications: Fluid aspiration attempted however no fluid was obtained secondary to loculated fluid collection.   Electronically Signed   By: Art  Hoss M.D.   On: 12/31/2012 11:47    Review of Systems  Constitutional: Positive for fever, chills, malaise/fatigue and diaphoresis.       [redacted] weeks pregnant  Respiratory: Positive for cough and shortness of breath. Negative for hemoptysis.   Cardiovascular: Positive for chest pain (right side pleuritic) and leg swelling.  Psychiatric/Behavioral:       Bipolar, OCD   Blood pressure 145/91, pulse 95, temperature 98.5 F (36.9 C), temperature source Oral, resp. rate 22, height 5' 2" (1.575 m), weight 202 lb 9.6 oz (91.9 kg), last menstrual period 05/13/2012, SpO2 92.00%. Physical Exam  Vitals reviewed. Constitutional: She is oriented to person, place, and time. No distress.  Pregnant 22 yo woman in NAD  HENT:  Head: Normocephalic and atraumatic.  Eyes: EOM are normal. Pupils are equal, round, and reactive to light.  Neck: No thyromegaly present.  Cardiovascular:  Regular rhythm and normal heart sounds.   Tachycardic, regular  Respiratory:  Absent BS right base  GI:  pregnant  Musculoskeletal: She exhibits edema.  Lymphadenopathy:    She has no cervical adenopathy.  Neurological: She is alert and oriented to person, place, and time. No cranial nerve deficit.  Skin: Skin is warm and dry.   CT CHEST 12/31/2012   CLINICAL DATA:  Thirty-four weeks pregnant status post recent attempted right thoracentesis. Evaluate pleural effusion and airspace opacity.  EXAM: CT CHEST WITHOUT CONTRAST  TECHNIQUE: Multidetector CT imaging of the chest was performed following the standard protocol without IV contrast.  COMPARISON:  Abdominal pelvic CT 10/23/2012. Chest radiographs 12/30/2012 and 12/31/2012.  FINDINGS: A moderate size residual right pleural effusion appears partially loculated posteriorly and laterally. There is no pneumothorax. There is no significant left pleural effusion or pericardial effusion. There is subtotal opacification of the right middle and lower lobes with associated air bronchograms. There is peripheral atelectasis in the right upper lobe adjacent to the pleural effusion. There is mild left lower lobe atelectasis. No endobronchial lesion or dominant mass is apparent on non contrast imaging.  There is elevation of the right hemidiaphragm with resulting mild mediastinal shift to the left. No enlarged mediastinal or hilar lymph nodes are identified.  The visualized upper abdomen appears unremarkable. There are no worrisome osseous findings.  IMPRESSION: 1. Residual right pleural effusion is partially loculated posteriorly and laterally status post recent attempted thoracentesis. No pneumothorax.  2. Underlying nonspecific right lung airspace opacities with air bronchograms in the right middle and lower lobes. No lung mass is visualized on non contrast imaging.   Electronically Signed   By: Bill  Veazey   On: 12/31/2012 15:46   Assessment/Plan: 22 yo WF at  35 weeks with a complex, loculated right pleural effusion. US guided thoracentesis attempt was unsuccessful. Best option from pulmonary standpoint is a right VATS, drainage of effusion and decortication. I don't think additional attempts at percutaneous drainage are likely to be successful. Unfortunately she is pregnant which raises obvious concerns although with the fetus at 35 weeks it should be about as safe as it is going to get. The other option would be to delay intervention on the effusion   until after she delivers, but she is miserable currently and would be a risk for this developing into an empyema or developing a trapped lung.  I would favor proceeding with VATS if OB and anesthesia are comfortable with that decision.   I discussed the general nature of the procedure with her, including the incisions to be used, need for general anesthesia and postoperative recovery. We briefly discussed risks to her and her baby, but will go into more detail if we get the go ahead from OB. We could do the procedure on Friday if she wishes to proceed.  Jayton Popelka C 12/31/2012, 7:09 PM      

## 2013-01-01 LAB — BASIC METABOLIC PANEL
BUN: 28 mg/dL — ABNORMAL HIGH (ref 6–23)
Calcium: 8.5 mg/dL (ref 8.4–10.5)
GFR calc Af Amer: >90 mL/min (ref >90)
GFR calc non Af Amer: >90 mL/min (ref >90)
Glucose, Bld: 168 mg/dL — ABNORMAL HIGH (ref 70–99)
Potassium: 3.7 mEq/L (ref 3.5–5.1)
Sodium: 133 mEq/L — ABNORMAL LOW (ref 135–145)

## 2013-01-01 LAB — CBC
Hemoglobin: 9.6 g/dL — ABNORMAL LOW (ref 12.0–15.0)
MCH: 24.3 pg — ABNORMAL LOW (ref 26.0–34.0)
MCHC: 32.3 g/dL (ref 30.0–36.0)
RDW: 13.9 % (ref 11.5–15.5)

## 2013-01-01 MED ORDER — BETAMETHASONE SOD PHOS & ACET 6 (3-3) MG/ML IJ SUSP: 12.0000 mg | INTRAMUSCULAR | Status: DC

## 2013-01-01 MED ORDER — DEXAMETHASONE SODIUM PHOSPHATE 10 MG/ML IJ SOLN
15.0000 mg | INTRAMUSCULAR | Status: AC
  Administered 2013-01-01 – 2013-01-02 (×2): 15 mg via INTRAMUSCULAR
  Filled 2013-01-01 (×2): qty 1.5

## 2013-01-01 NOTE — Progress Notes (Signed)
PULMONARY  / CRITICAL CARE MEDICINE  Name: [name] [name] MRN: [id number] DOB: [birth year]    ADMISSION DATE:  12/30/2012 CONSULTATION DATE:  12/30/2012  REFERRING MD :  Tawanna Cooler Meisinger  CHIEF COMPLAINT: Short of breath  BRIEF PATIENT DESCRIPTION:  22 y/o female in 34th week of pregnancy had Rt hydronephrosis s/p percutaneous nephrostomy tube.  She had attempted tube exchange on 9/09 >> complicated by contrast extravasation.  Developed progressive dyspnea, Rt pleuritic chest pain, hypoxia, and fever up to 103F.  Presented again to [facility] ER and found to have large rt pleural effusion.  PCCM consulted to assess pleural effusion.  SIGNIFICANT EVENTS: 9/09 - unsuccessful percutaneous nephrostomy tube exchange 9/10 - urology consulted, placement of Rt ureteral double J stent 9/11 - oxygen desaturation noted >> CXR shows ATX 9/12 - d/c home 9/15 - present to [facility] ED with dyspnea >> Large Rt pleural effusion, PCCM consulted 9/16 - unable to perform thora, not enough fluid per IR, TCTS consulted for loculated Rt pleural effusion  STUDIES:  9/16 CT chest >> mod Rt pleural effusion partially loculated, consolidation with air bronchograms RML and RLL  LINES / TUBES: 10/30/12 Rt nephrostomy tube >> out  CULTURES: Blood 9/15 >> GPC in clusters >>  ANTIBIOTICS: Rocephin 9/09 >> 9/11 Clindamycin 9/09 >> 9/09 Keflex 9/11 >> 9/15 Zosyn 9/15 >> Vancomycin 9/15 >>   SUBJECTIVE: Chest pain improved.  Breathing more comfortable.  Baby kicking lots.  VITAL SIGNS: Temp:  [98 F (36.7 C)-99.4 F (37.4 C)] 98.4 F (36.9 C) (09/17 0836) Pulse Rate:  [83-105] 83 (09/17 0836) Resp:  [13-22] 16 (09/17 0836) BP: (117-146)/(68-91) 117/68 mmHg (09/17 0836) SpO2:  [91 %-94 %] 94 % (09/17 0836) Weight:  [205 lb 7.5 oz (93.2 kg)-207 lb 3.7 oz (94 kg)] 207 lb 3.7 oz (94 kg) (09/17 0500) Room air  PHYSICAL EXAMINATION: General: no distress Neuro:  Alert, normal strength, moves all extremities HEENT:  No  sinus tenderness Cardiovascular:  s1s2 regular Lungs: decreased breath sounds on Rt, no wheeze Abdomen:  Gravid uterus, non tender Musculoskeletal: minimal ankle edema Skin:  Multiple tatoos, no rashes  Labs: CBC Recent Labs     12/30/12  1740  WBC  20.5*  HGB  10.2*  HCT  30.5*  PLT  341   BMET No results found for this basename: NA, K, CL, CO2, BUN, CREATININE, GLUCOSE,  in the last 72 hours Electrolytes No results found for this basename: CALCIUM, MG, PHOS,  in the last 72 hours  Sepsis Markers Recent Labs     12/30/12  1815  PROCALCITON  0.11   Liver Enzymes No results found for this basename: AST, ALT, ALKPHOS, BILITOT, ALBUMIN,  in the last 72 hours  Imaging Dg Chest 1 View  12/31/2012   CLINICAL DATA:  Post thoracentesis.  EXAM: CHEST - 1 VIEW  COMPARISON:  12/30/2012  FINDINGS: Large right pleural effusion persists, not significantly changed. No visible pneumothorax. Right lower lobe atelectasis or consolidation. Cardiomegaly. No confluent opacity on the left.  IMPRESSION: Continued large right pleural effusion. No pneumothorax following thoracentesis.   Electronically Signed   By: Charlett Nose M.D.   On: 12/31/2012 11:35   Dg Chest 2 View  12/30/2012   *RADIOLOGY REPORT*  Clinical Data: Chest pain  CHEST - 2 VIEW  Comparison: 12/26/2012  Findings: Increase in size of large right pleural effusion.  There is progressive consolidation / atelectasis in the right   mid and lower lung.  Left lung clear.  Heart size stable.  Regional bones unremarkable.  IMPRESSION:  Enlarging large right pleural effusion and associated right lung atelectasis/consolidation.   Original Report Authenticated By: D. Andria Rhein, MD   Ct Chest Wo Contrast  12/31/2012   CLINICAL DATA:  Thirty-four weeks pregnant status post recent attempted right thoracentesis. Evaluate pleural effusion and airspace opacity.  EXAM: CT CHEST WITHOUT CONTRAST  TECHNIQUE: Multidetector CT imaging of the chest was  performed following the standard protocol without IV contrast.  COMPARISON:  Abdominal pelvic CT 10/23/2012. Chest radiographs 12/30/2012 and 12/31/2012.  FINDINGS: A moderate size residual right pleural effusion appears partially loculated posteriorly and laterally. There is no pneumothorax. There is no significant left pleural effusion or pericardial effusion. There is subtotal opacification of the right middle and lower lobes with associated air bronchograms. There is peripheral atelectasis in the right upper lobe adjacent to the pleural effusion. There is mild left lower lobe atelectasis. No endobronchial lesion or dominant mass is apparent on non contrast imaging.  There is elevation of the right hemidiaphragm with resulting mild mediastinal shift to the left. No enlarged mediastinal or hilar lymph nodes are identified.  The visualized upper abdomen appears unremarkable. There are no worrisome osseous findings.  IMPRESSION: 1. Residual right pleural effusion is partially loculated posteriorly and laterally status post recent attempted thoracentesis. No pneumothorax.  2. Underlying nonspecific right lung airspace opacities with air bronchograms in the right middle and lower lobes. No lung mass is visualized on non contrast imaging.   Electronically Signed   By: Roxy Horseman   On: 12/31/2012 15:46   US Thoracentesis Asp Pleural Space W/img Guide  12/31/2012   CLINICAL DATA:  Right-sided pleural effusion.  EXAM: ULTRASOUND GUIDED right THORACENTESIS  COMPARISON:  None.  FINDINGS: A total of approximately 0cc of fluid was removed.  IMPRESSION: Attempted ultrasound guided thoracentesis yielding no pleural fluid.  Read By: Pattricia Boss PA-C  PROCEDURE: An ultrasound guided thoracentesis was thoroughly discussed with the patient and questions answered. The benefits, risks, alternatives and complications were also discussed. The patient understands and wishes to proceed with the procedure. Written consent was  obtained.  Ultrasound was performed to localize and mark an adequate pocket of fluid in the right chest. The area was then prepped and draped in the normal sterile fashion. 1% Lidocaine was used for local anesthesia. Under ultrasound guidance a 19 gauge Yueh catheter was introduced. Thoracentesis was performed. The catheter was removed and a dressing applied.  Complications: Fluid aspiration attempted however no fluid was obtained secondary to loculated fluid collection.   Electronically Signed   By: Maryclare Bean M.D.   On: 12/31/2012 11:47      ASSESSMENT / PLAN:  PULMONARY: A: Rt sided loculated effusion with PNA >> most likely from aspiration. P: -will need to d/w OB-GYN and TCTS about best approach >> ?proceed with VATS first, or delay and deliver baby first -limit chest imaging studies in setting of pregnancy -oxygen as needed to keep SpO2 > 94%  CARDIAC A: SIRS/sepsis >> improved. P: -monitor hemodynamics -KVO IV fluids  RENAL A: Rt hydronephrosis of pregnancy s/p Rt ureteral stent P: -monitor renal fx, urine outpt, electrolytes  HEMATOLOGY A:  Leukocytosis >> likely related to infection Anemia of pregnancy. P: -f/u CBC 9/17 -SCD for DVT prevention  GASTROENTEROLOGY A: Reflux in pregnancy. Constipation P: -protonix  -tums as needed -Miralax prn  OBSTETRICS A: 35 week pregnancy. P: -OB-GYN will assist with care while pt is in [facility]  INFECTION A:  Aspiration pneumonia. GPC in cluster in one of two blood cultures. P: -D3/x vancomycin, zosyn -f/u blood cx  ENDOCRINE A: No acute issues. P: -Monitor blood sugar on BMET  NEUROLOGY A: Pain control. P: -prn tylenol, percocet, dilaudid  Summary: 22 yo female at 35 weeks pregnancy with probable aspiration pneumonia and loculated Rt pleural effusion.  Clinical improvement with Abx, but she will need definitive intervention for pleural effusion to avoid long term complications (ie VATS).  She is very  concerned about having VATS done prior to delivering her baby, and would prefer to defer this until after her baby is delivered if possible.  Will need to d/w Ob-Gyn and TCTS to determine optimal approach regarding health interests of the mother and baby.  Coralyn Helling, MD [facility] Pulmonary/Critical Care 01/01/2013, 8:48 AM Pager:  [phone] After 3pm call: [phone]

## 2013-01-01 NOTE — Progress Notes (Signed)
OB [redacted]W[redacted]D Feeling better, +FM Afeb, VSS Fundus NT Labs, imaging and notes reviewed I would prefer to get her delivered before she has surgery, understanding that time is of the essence.  I will discuss her case with Maternal Fetal Medicine as far as recommendations on how early to get her delivered, would at least deliver at 37 weeks, maybe earlier.  Would try vaginal delivery, c-section if does not tolerate or for other OB indications.

## 2013-01-01 NOTE — Progress Notes (Signed)
Feels better today. Pain much improved. Still has pain with a deep breath.  BP 137/94  Pulse 91  Temp(Src) 98.6 F (37 C) (Oral)  Resp 12  Ht 5\' 2"  (1.575 m)  Wt 207 lb 3.7 oz (94 kg)  BMI 37.89 kg/m2  SpO2 94%  LMP 05/13/2012 Afebrile   Exam unchanged.  Discussed with Dr. Jackelyn Knife- he favors going ahead and delivering her prior to VATS. I agree that would be best. If she is able to deliver this weekend we could plan to go ahead with the VATS, decortication next week.

## 2013-01-01 NOTE — Progress Notes (Signed)
OB  I have spoken with MFM and Dr. Dorris Fetch.  I think we all agree it would be better to deliver prior to VATS.   She is getting 2 doses of steroids to help with fetal lung maturity, even though she is almost 35 weeks.  I am on call this Friday, I will plan to bring her back to [facility] ([place]) Friday morning to work on getting her delivered.  She can then have her VATS whenever this can be acomodated.

## 2013-01-01 NOTE — Progress Notes (Signed)
CRITICAL VALUE ALERT  Critical value received:  Aerobic bottle showed gram + cocci & cluster  Date of notification:  01/01/2013  Time of notification:  0045  Critical value read back:yes  Nurse who received alert:  Lillia Corporal  MD notified (1st page):  Dr. Marchelle Gearing  Time of first page:  0050  MD notified (2nd page):  Time of second page:  Responding MD:  Dr. Marchelle Gearing  Time MD responded:  [phone]

## 2013-01-02 LAB — COMPREHENSIVE METABOLIC PANEL
Albumin: 1.8 g/dL — ABNORMAL LOW (ref 3.5–5.2)
Alkaline Phosphatase: 163 U/L — ABNORMAL HIGH (ref 39–117)
BUN: 14 mg/dL (ref 6–23)
Calcium: 8.9 mg/dL (ref 8.4–10.5)
GFR calc Af Amer: 47 mL/min — ABNORMAL LOW (ref >90)
Glucose, Bld: 134 mg/dL — ABNORMAL HIGH (ref 70–99)
Potassium: 3.5 mEq/L (ref 3.5–5.1)
Sodium: 134 mEq/L — ABNORMAL LOW (ref 135–145)
Total Protein: 5.9 g/dL — ABNORMAL LOW (ref 6.0–8.3)

## 2013-01-02 LAB — CBC
HCT: 29.8 % — ABNORMAL LOW (ref 36.0–46.0)
Hemoglobin: 9.9 g/dL — ABNORMAL LOW (ref 12.0–15.0)
MCV: 73.8 fL — ABNORMAL LOW (ref 78.0–100.0)
WBC: 20.8 10*3/uL — ABNORMAL HIGH (ref 4.0–10.5)

## 2013-01-02 LAB — CULTURE, BLOOD (ROUTINE X 2)

## 2013-01-02 NOTE — Consult Note (Signed)
Urology Consult   Physician requesting consult: Dr. Jackelyn Knife   Reason for consult: Obstructed kidney  History of Present Illness: [name] [name] is a 22 y.o. female approximately 35 weeks intrauterine pregnancy, who has had significant issues with right ureteral obstruction secondary to uterine compression. She was initially treated with percutaneous nephrostomy tube placement. During her attempted tube change last week, she had extravasation of contrast into her pleura. Tube change was unsuccessful, and she was treated, during an inpatient stay, with an eventual double-J stent placement. Her kidney has been appropriately drained and she's been relatively symptom free from a kidney standpoint since that time. She was recently admitted with pneumonia and a right pleural effusion. She has had a fever which has improved with antibiotic management. She has been scheduled for elective induction as well as eventual VATS procedure.    Past Medical History  Diagnosis Date  . Anxiety   . Chronic pain     muscle spasms- entire body  . Infection     UTI  . Fracture, ankle     left- x2  . Hydronephrosis   . Bipolar 1 disorder   . Post traumatic stress disorder (PTSD)   . Depression     doing fine now  . OCD (obsessive compulsive disorder)     Past Surgical History  Procedure Laterality Date  . No past surgeries    . Nephrostomy tube placement at 25wks and removed at 335/7 Right   . Cystoscopy with stent placement Right 12/25/2012    Procedure: CYSTOSCOPY WITH STENT PLACEMENT;  Surgeon: Marcine Matar, MD;  Location: [facility];  Service: Urology;  Laterality: Right;     Current Hospital Medications: Scheduled Meds: . dexamethasone  15 mg Intramuscular Q24H  . docusate sodium  100 mg Oral Daily  . pantoprazole  40 mg Oral Q1200  . piperacillin-tazobactam (ZOSYN)  IV  3.375 g Intravenous Q8H  . prenatal multivitamin  1 tablet Oral Q1200  . vancomycin  1,500 mg Intravenous Q8H    Continuous Infusions: . sodium chloride 10 mL (01/01/13 0859)   PRN Meds:.acetaminophen, calcium carbonate, HYDROmorphone (DILAUDID) injection, ondansetron, oxybutynin, oxyCODONE-acetaminophen, polyethylene glycol, zolpidem  Allergies:  Allergies  Allergen Reactions  . Cinnamon Anaphylaxis and Hives  . Shellfish Allergy Anaphylaxis  . Amoxicillin-Pot Clavulanate Nausea And Vomiting  . Nickel Rash    Family History  Problem Relation Age of Onset  . Heart disease Mother   . Hypertension Father   . Cancer Paternal Grandmother     lung  . Heart disease Paternal Grandmother     Social History:  reports that she has been smoking Cigarettes.  She has been smoking about 0.25 packs per day. She has never used smokeless tobacco. She reports that  drinks alcohol. She reports that she does not use illicit drugs.  ROS: A complete review of systems was performed.  All systems are negative except for pertinent findings as noted.  Physical Exam:  Vital signs in last 24 hours: Temp:  [98 F (36.7 C)-98.7 F (37.1 C)] 98 F (36.7 C) (09/18 0441) Pulse Rate:  [76-96] 96 (09/17 2000) Resp:  [12-20] 20 (09/18 0441) BP: (117-146)/(68-99) 135/68 mmHg (09/18 0441) SpO2:  [94 %-98 %] 94 % (09/17 1511) General:  Alert and oriented, No acute distress HEENT: Normocephalic, atraumatic Abdomen: Soft, nontender, nondistended, no abdominal masses. Abdomen was gravid Back: No CVA tenderness Neurologic: Grossly intact  Laboratory Data:   Recent Labs  12/30/12 1740 01/01/13 0500 01/02/13 0410  WBC 20.5*  17.2* 20.8*  HGB 10.2* 9.6* 9.9*  HCT 30.5* 29.7* 29.8*  PLT 341 297 372     Recent Labs  01/01/13 0500 01/02/13 0410  NA 133* 134*  K 3.7 3.5  CL 87* 101  GLUCOSE 168* 134*  BUN 28* 14  CALCIUM 8.5 8.9  CREATININE 0.78 1.73*     Results for orders placed during the hospital encounter of 12/30/12 (from the past 24 hour(s))  COMPREHENSIVE METABOLIC PANEL     Status: Abnormal    Collection Time    01/02/13  4:10 AM      Result Value Range   Sodium 134 (*) 135 - 145 mEq/L   Potassium 3.5  3.5 - 5.1 mEq/L   Chloride 101  96 - 112 mEq/L   CO2 21  19 - 32 mEq/L   Glucose, Bld 134 (*) 70 - 99 mg/dL   BUN 14  6 - 23 mg/dL   Creatinine, Ser 0.98 (*) 0.50 - 1.10 mg/dL   Calcium 8.9  8.4 - 11.9 mg/dL   Total Protein 5.9 (*) 6.0 - 8.3 g/dL   Albumin 1.8 (*) 3.5 - 5.2 g/dL   AST 11  0 - 37 U/L   ALT 6  0 - 35 U/L   Alkaline Phosphatase 163 (*) 39 - 117 U/L   Total Bilirubin 0.1 (*) 0.3 - 1.2 mg/dL   GFR calc non Af Amer 41 (*) >90 mL/min   GFR calc Af Amer 47 (*) >90 mL/min  CBC     Status: Abnormal   Collection Time    01/02/13  4:10 AM      Result Value Range   WBC 20.8 (*) 4.0 - 10.5 K/uL   RBC 4.04  3.87 - 5.11 MIL/uL   Hemoglobin 9.9 (*) 12.0 - 15.0 g/dL   HCT 14.7 (*) 82.9 - 56.2 %   MCV 73.8 (*) 78.0 - 100.0 fL   MCH 24.5 (*) 26.0 - 34.0 pg   MCHC 33.2  30.0 - 36.0 g/dL   RDW 13.0  86.5 - 78.4 %   Platelets 372  150 - 400 K/uL   Recent Results (from the past 240 hour(s))  URINE CULTURE     Status: None   Collection Time    12/25/12  2:53 PM      Result Value Range Status   Specimen Description     Final   Value: URINE, RANDOM     Performed at [facility]   Special Requests     Final   Value: NONE     Performed at [facility]   Culture  Setup Time     Final   Value: 12/25/2012 21:50     Performed at [facility]   Colony Count     Final   Value: NO GROWTH     Performed at [facility]   Culture     Final   Value: NO GROWTH     Performed at [facility]   Report Status 12/26/2012 FINAL   Final  CULTURE, BLOOD (ROUTINE X 2)     Status: None   Collection Time    12/30/12  5:40 PM      Result Value Range Status   Specimen Description BLOOD LEFT ARM   Final   Special Requests     Final   Value: BOTTLES DRAWN AEROBIC AND ANAEROBIC 6CC EACH BOTTLE   Culture  Setup Time  Final    Value: 12/30/2012 22:28     Performed at [facility]   Culture     Final   Value:        BLOOD CULTURE RECEIVED NO GROWTH TO DATE CULTURE WILL BE HELD FOR 5 DAYS BEFORE ISSUING A FINAL NEGATIVE REPORT     Performed at [facility]   Report Status PENDING   Incomplete  CULTURE, BLOOD (ROUTINE X 2)     Status: None   Collection Time    12/30/12  5:50 PM      Result Value Range Status   Specimen Description BLOOD RIGHT ARM   Final   Special Requests BOTTLES DRAWN AEROBIC ONLY 3CC   Final   Culture  Setup Time     Final   Value: 12/30/2012 22:28     Performed at [facility]   Culture     Final   Value: GRAM POSITIVE COCCI IN CLUSTERS     1244 Note: Gram Stain Report Called to,Read Back By and Verified With: MARY BETH [facility] 01/01/2013 [facility]     Performed at [facility]   Report Status PENDING   Incomplete  MRSA PCR SCREENING     Status: None   Collection Time    12/30/12  9:21 PM      Result Value Range Status   MRSA by PCR NEGATIVE  NEGATIVE Final   Comment:            The GeneXpert MRSA Assay (FDA     approved for NASAL specimens     only), is one component of a     comprehensive MRSA colonization     surveillance program. It is not     intended to diagnose MRSA     infection nor to guide or     monitor treatment for     MRSA infections.    Renal Function:  Recent Labs  12/29/12 0225 01/01/13 0500 01/02/13 0410  CREATININE 0.57 0.78 1.73*   Estimated Creatinine Clearance: 54.1 ml/min (by C-G formula based on Cr of 1.73).  Radiologic Imaging: Dg Chest 1 View  12/31/2012   CLINICAL DATA:  Post thoracentesis.  EXAM: CHEST - 1 VIEW  COMPARISON:  12/30/2012  FINDINGS: Large right pleural effusion persists, not significantly changed. No visible pneumothorax. Right lower lobe atelectasis or consolidation. Cardiomegaly. No confluent opacity on the left.  IMPRESSION: Continued large right pleural effusion. No pneumothorax following  thoracentesis.   Electronically Signed   By: Charlett Nose M.D.   On: 12/31/2012 11:35   Ct Chest Wo Contrast  12/31/2012   CLINICAL DATA:  Thirty-four weeks pregnant status post recent attempted right thoracentesis. Evaluate pleural effusion and airspace opacity.  EXAM: CT CHEST WITHOUT CONTRAST  TECHNIQUE: Multidetector CT imaging of the chest was performed following the standard protocol without IV contrast.  COMPARISON:  Abdominal pelvic CT 10/23/2012. Chest radiographs 12/30/2012 and 12/31/2012.  FINDINGS: A moderate size residual right pleural effusion appears partially loculated posteriorly and laterally. There is no pneumothorax. There is no significant left pleural effusion or pericardial effusion. There is subtotal opacification of the right middle and lower lobes with associated air bronchograms. There is peripheral atelectasis in the right upper lobe adjacent to the pleural effusion. There is mild left lower lobe atelectasis. No endobronchial lesion or dominant mass is apparent on non contrast imaging.  There is elevation of the right hemidiaphragm with resulting mild mediastinal shift to the left. No enlarged mediastinal  or hilar lymph nodes are identified.  The visualized upper abdomen appears unremarkable. There are no worrisome osseous findings.  IMPRESSION: 1. Residual right pleural effusion is partially loculated posteriorly and laterally status post recent attempted thoracentesis. No pneumothorax.  2. Underlying nonspecific right lung airspace opacities with air bronchograms in the right middle and lower lobes. No lung mass is visualized on non contrast imaging.   Electronically Signed   By: Roxy Horseman   On: 12/31/2012 15:46   US Thoracentesis Asp Pleural Space W/img Guide  12/31/2012   CLINICAL DATA:  Right-sided pleural effusion.  EXAM: ULTRASOUND GUIDED right THORACENTESIS  COMPARISON:  None.  FINDINGS: A total of approximately 0cc of fluid was removed.  IMPRESSION: Attempted ultrasound  guided thoracentesis yielding no pleural fluid.  Read By: Pattricia Boss PA-C  PROCEDURE: An ultrasound guided thoracentesis was thoroughly discussed with the patient and questions answered. The benefits, risks, alternatives and complications were also discussed. The patient understands and wishes to proceed with the procedure. Written consent was obtained.  Ultrasound was performed to localize and mark an adequate pocket of fluid in the right chest. The area was then prepped and draped in the normal sterile fashion. 1% Lidocaine was used for local anesthesia. Under ultrasound guidance a 19 gauge Yueh catheter was introduced. Thoracentesis was performed. The catheter was removed and a dressing applied.  Complications: Fluid aspiration attempted however no fluid was obtained secondary to loculated fluid collection.   Electronically Signed   By: Maryclare Bean M.D.   On: 12/31/2012 11:47    I independently reviewed the above imaging studies.  Impression/Assessment:  1. Pregnancy related right hydronephrosis, treated initially with percutaneous nephrostomy tube, now with a double-J stent. She seems to be relatively asymptomatic from that  2. Right pleural effusion and pneumonia, related to recent attempt at percutaneous nephrostomy tube change. This pneumonia is improving, pleural effusion will need eventual VATS procedure  Plan:  1. We will leave for double-J stent in. I told her that we can take this out as soon as she has completed her VATS and her delivery. The shorter the amount of time the stent is left in, the better as she will no doubt form encrustations on it  2. I have asked her to call me when she has recovered, for eventual stent removal in the office

## 2013-01-02 NOTE — Progress Notes (Signed)
In to see [name] [name] for non-stress test/fetal monitoring. Pt has no OB complaints. FHT 115bpm when monitors placed @2344 . Pt understands plan to transfer to [facility] in am for induction of labor.

## 2013-01-02 NOTE — Progress Notes (Signed)
PULMONARY  / CRITICAL CARE MEDICINE  Name: [name] [name] MRN: [id number] DOB: 24-Jun-1990    ADMISSION DATE:  12/30/2012 CONSULTATION DATE:  12/30/2012  REFERRING MD :  Tawanna Cooler Meisinger  CHIEF COMPLAINT: Short of breath  BRIEF PATIENT DESCRIPTION:  22 y/o female in 34th week of pregnancy had Rt hydronephrosis s/p percutaneous nephrostomy tube.  She had attempted tube exchange on 9/09 >> complicated by contrast extravasation.  Developed progressive dyspnea, Rt pleuritic chest pain, hypoxia, and fever up to 103F.  Presented again to [facility] ER and found to have large rt pleural effusion.  PCCM consulted to assess pleural effusion.  SIGNIFICANT EVENTS: 9/09 - unsuccessful percutaneous nephrostomy tube exchange 9/10 - urology consulted, placement of Rt ureteral double J stent 9/11 - oxygen desaturation noted >> CXR shows ATX 9/12 - d/c home 9/15 - present to [facility] ED with dyspnea >> Large Rt pleural effusion, PCCM consulted 9/16 - unable to perform thora, not enough fluid per IR, TCTS consulted for loculated Rt pleural effusion  STUDIES:  9/16 CT chest >> mod Rt pleural effusion partially loculated, consolidation with air bronchograms RML and RLL  LINES / TUBES: 10/30/12 Rt nephrostomy tube >> out  CULTURES: Blood 9/15 >> GPC in clusters >>  ANTIBIOTICS: Rocephin 9/09 >> 9/11 Clindamycin 9/09 >> 9/09 Keflex 9/11 >> 9/15 Zosyn 9/15 >> Vancomycin 9/15 >>   SUBJECTIVE: Denies chest pain, SOB.  Feeling a little better this am.  Good fetal movement.   VITAL SIGNS: Temp:  [98 F (36.7 C)-98.7 F (37.1 C)] 98.3 F (36.8 C) (09/18 0856) Pulse Rate:  [76-126] 126 (09/18 0856) Resp:  [12-20] 20 (09/18 0856) BP: (130-146)/(68-99) 130/84 mmHg (09/18 0856) SpO2:  [94 %-98 %] 94 % (09/18 0856) Room air  PHYSICAL EXAMINATION: General: no distress Neuro:  Alert, normal strength, moves all extremities HEENT:  No sinus tenderness Cardiovascular:  s1s2 regular Lungs: decreased breath sounds on  Rt, few scattered rhonchi L, no wheeze Abdomen:  Gravid uterus, non tender Musculoskeletal: minimal ankle edema Skin:  Multiple tatoos, no rashes  Labs: CBC Recent Labs     12/30/12  1740  01/01/13  0500  01/02/13  0410  WBC  20.5*  17.2*  20.8*  HGB  10.2*  9.6*  9.9*  HCT  30.5*  29.7*  29.8*  PLT  341  297  372   BMET Recent Labs     01/01/13  0500  01/02/13  0410  NA  133*  134*  K  3.7  3.5  CL  87*  101  CO2  38*  21  BUN  28*  14  CREATININE  0.78  1.73*  GLUCOSE  168*  134*   Electrolytes Recent Labs     01/01/13  0500  01/02/13  0410  CALCIUM  8.5  8.9    Sepsis Markers Recent Labs     12/30/12  1815  01/01/13  0500  PROCALCITON  0.11  <0.10   Liver Enzymes Recent Labs     01/02/13  0410  AST  11  ALT  6  ALKPHOS  163*  BILITOT  0.1*  ALBUMIN  1.8*    Imaging Dg Chest 1 View  12/31/2012   CLINICAL DATA:  Post thoracentesis.  EXAM: CHEST - 1 VIEW  COMPARISON:  12/30/2012  FINDINGS: Large right pleural effusion persists, not significantly changed. No visible pneumothorax. Right lower lobe atelectasis or consolidation. Cardiomegaly. No confluent opacity on the left.  IMPRESSION: Continued large right pleural  effusion. No pneumothorax following thoracentesis.   Electronically Signed   By: Charlett Nose M.D.   On: 12/31/2012 11:35   Ct Chest Wo Contrast  12/31/2012   CLINICAL DATA:  Thirty-four weeks pregnant status post recent attempted right thoracentesis. Evaluate pleural effusion and airspace opacity.  EXAM: CT CHEST WITHOUT CONTRAST  TECHNIQUE: Multidetector CT imaging of the chest was performed following the standard protocol without IV contrast.  COMPARISON:  Abdominal pelvic CT 10/23/2012. Chest radiographs 12/30/2012 and 12/31/2012.  FINDINGS: A moderate size residual right pleural effusion appears partially loculated posteriorly and laterally. There is no pneumothorax. There is no significant left pleural effusion or pericardial effusion.  There is subtotal opacification of the right middle and lower lobes with associated air bronchograms. There is peripheral atelectasis in the right upper lobe adjacent to the pleural effusion. There is mild left lower lobe atelectasis. No endobronchial lesion or dominant mass is apparent on non contrast imaging.  There is elevation of the right hemidiaphragm with resulting mild mediastinal shift to the left. No enlarged mediastinal or hilar lymph nodes are identified.  The visualized upper abdomen appears unremarkable. There are no worrisome osseous findings.  IMPRESSION: 1. Residual right pleural effusion is partially loculated posteriorly and laterally status post recent attempted thoracentesis. No pneumothorax.  2. Underlying nonspecific right lung airspace opacities with air bronchograms in the right middle and lower lobes. No lung mass is visualized on non contrast imaging.   Electronically Signed   By: Roxy Horseman   On: 12/31/2012 15:46   US Thoracentesis Asp Pleural Space W/img Guide  12/31/2012   CLINICAL DATA:  Right-sided pleural effusion.  EXAM: ULTRASOUND GUIDED right THORACENTESIS  COMPARISON:  None.  FINDINGS: A total of approximately 0cc of fluid was removed.  IMPRESSION: Attempted ultrasound guided thoracentesis yielding no pleural fluid.  Read By: Pattricia Boss PA-C  PROCEDURE: An ultrasound guided thoracentesis was thoroughly discussed with the patient and questions answered. The benefits, risks, alternatives and complications were also discussed. The patient understands and wishes to proceed with the procedure. Written consent was obtained.  Ultrasound was performed to localize and mark an adequate pocket of fluid in the right chest. The area was then prepped and draped in the normal sterile fashion. 1% Lidocaine was used for local anesthesia. Under ultrasound guidance a 19 gauge Yueh catheter was introduced. Thoracentesis was performed. The catheter was removed and a dressing applied.   Complications: Fluid aspiration attempted however no fluid was obtained secondary to loculated fluid collection.   Electronically Signed   By: Maryclare Bean M.D.   On: 12/31/2012 11:47      ASSESSMENT / PLAN:  PULMONARY: A: Rt sided loculated effusion with PNA >> most likely from aspiration. P: -as of now plan is for tx to [facility] 9/19 for induction and hopefully vaginal delivery (induction may take 2-3 days) then back to [facility] for VATS/decortication post delivery -limit chest imaging studies in setting of pregnancy -oxygen as needed to keep SpO2 > 94%  CARDIAC A: SIRS/sepsis >> improved. P: -monitor hemodynamics  RENAL A: Rt hydronephrosis of pregnancy s/p Rt ureteral stent Acute renal insufficiency 9/18 >> ? From vancomycin P: -monitor renal fx, urine outpt, electrolytes -add gentle fluids 9/18 with bump Scr  -d/c vanc -urology following ([facility]) - ?need for stent adjustment   HEMATOLOGY A:  Leukocytosis >> likely related to infection Anemia of pregnancy. P: -f/u CBC 9/19 -SCD for DVT prevention  GASTROENTEROLOGY A: Reflux in pregnancy. Constipation P: -protonix  -tums as  needed -Miralax prn  OBSTETRICS A: 35 week pregnancy. P: -OB-GYN will assist with care while pt is in [facility]  -planned induction 9/19 am at [facility]   INFECTION A: Aspiration pneumonia. GPC in cluster in one of two blood cultures >> likely contaminate; low concern for MRSA. P: -D4/x zosyn -d/c vanc with increased Scr -f/u blood cx  ENDOCRINE A: No acute issues. P: -Monitor blood sugar on BMET  NEUROLOGY A: Pain control. P: -prn tylenol, percocet, dilaudid  Will follow tomorrow at [facility], NP 01/02/2013  10:46 AM Pager: [phone] or [phone]  Reviewed above, and examined.  Plan is to get her over to [facility] on 9/19 and start labor process.  She will remain on IV abx >> will d/c vancomycin in setting of elevated creatinine and low likelihood of  MRSA.  Will increase IV fluids, and f/u renal fx.  Appreciate input from urology regarding ureteral stent.  After delivery she will need to return to [facility] for VATS with Dr. Dorris Fetch.  PCCM will continue to follow while she is at [facility].  Updated family at bedside.  Coralyn Helling, MD [facility] Pulmonary/Critical Care 01/02/2013, 11:18 AM Pager:  [phone] After 3pm call: [phone]

## 2013-01-02 NOTE — Progress Notes (Signed)
OB [redacted]W[redacted]D No new complaints Afeb, VSS Will arrange for transfer to [facility] early tomorrow morning for cervical ripening and induction.  Induction may take 2-3 days.  Can continue current antibiotics in labor, will cover for Group B strep as well.

## 2013-01-02 NOTE — Progress Notes (Signed)
Was told by [facility] RN that patient will transferred to [facility] tomorrow around 6:30 AM via carelink. [facility] RN already notified carelink. Called PCCM MD for an order, and I am told by MD that it will be taken care of.

## 2013-01-03 LAB — COMPREHENSIVE METABOLIC PANEL
ALT: 6 U/L (ref 0–35)
AST: 12 U/L (ref 0–37)
Albumin: 1.8 g/dL — ABNORMAL LOW (ref 3.5–5.2)
CO2: 21 mEq/L (ref 19–32)
Chloride: 103 mEq/L (ref 96–112)
Creatinine, Ser: 2.67 mg/dL — ABNORMAL HIGH (ref 0.50–1.10)
Sodium: 138 mEq/L (ref 135–145)
Total Bilirubin: 0.1 mg/dL — ABNORMAL LOW (ref 0.3–1.2)

## 2013-01-03 LAB — CBC
HCT: 27.6 % — ABNORMAL LOW (ref 36.0–46.0)
HCT: 28.1 % — ABNORMAL LOW (ref 36.0–46.0)
Hemoglobin: 9.2 g/dL — ABNORMAL LOW (ref 12.0–15.0)
Hemoglobin: 9.2 g/dL — ABNORMAL LOW (ref 12.0–15.0)
MCH: 24.5 pg — ABNORMAL LOW (ref 26.0–34.0)
MCH: 24.3 pg — ABNORMAL LOW (ref 26.0–34.0)
MCHC: 33.3 g/dL (ref 30.0–36.0)
MCV: 73.6 fL — ABNORMAL LOW (ref 78.0–100.0)
MCV: 74.1 fL — ABNORMAL LOW (ref 78.0–100.0)
RBC: 3.79 MIL/uL — ABNORMAL LOW (ref 3.87–5.11)
RDW: 13.8 % (ref 11.5–15.5)
WBC: 29.5 10*3/uL — ABNORMAL HIGH (ref 4.0–10.5)

## 2013-01-03 LAB — TYPE AND SCREEN
ABO/RH(D): AB POS
Antibody Screen: NEGATIVE

## 2013-01-03 LAB — BASIC METABOLIC PANEL
BUN: 16 mg/dL (ref 6–23)
CO2: 19 mEq/L (ref 19–32)
Chloride: 105 mEq/L (ref 96–112)
Creatinine, Ser: 2.45 mg/dL — ABNORMAL HIGH (ref 0.50–1.10)
Glucose, Bld: 110 mg/dL — ABNORMAL HIGH (ref 70–99)
Potassium: 3.3 mEq/L — ABNORMAL LOW (ref 3.5–5.1)

## 2013-01-03 MED ORDER — LIDOCAINE HCL (PF) 1 % IJ SOLN
30.0000 mL | INTRAMUSCULAR | Status: DC | PRN
  Administered 2013-01-05: 30 mL via SUBCUTANEOUS
  Filled 2013-01-03: qty 30

## 2013-01-03 MED ORDER — ACETAMINOPHEN 325 MG PO TABS: 650.0000 mg | ORAL_TABLET | ORAL | Status: DC | PRN

## 2013-01-03 MED ORDER — IBUPROFEN 600 MG PO TABS: 600.0000 mg | ORAL_TABLET | Freq: Four times a day (QID) | ORAL | Status: DC | PRN

## 2013-01-03 MED ORDER — PHENYLEPHRINE 40 MCG/ML (10ML) SYRINGE FOR IV PUSH (FOR BLOOD PRESSURE SUPPORT): 80.0000 ug | PREFILLED_SYRINGE | INTRAVENOUS | Status: DC | PRN

## 2013-01-03 MED ORDER — OXYTOCIN 40 UNITS IN LACTATED RINGERS INFUSION - SIMPLE MED: 62.5000 mL/h | INTRAVENOUS | Status: DC

## 2013-01-03 MED ORDER — TERBUTALINE SULFATE 1 MG/ML IJ SOLN: 0.2500 mg | Freq: Once | INTRAMUSCULAR | Status: AC | PRN

## 2013-01-03 MED ORDER — POTASSIUM CHLORIDE 20 MEQ/15ML (10%) PO LIQD
20.0000 meq | Freq: Three times a day (TID) | ORAL | Status: DC
  Administered 2013-01-03: 20 meq via ORAL
  Filled 2013-01-03 (×2): qty 15

## 2013-01-03 MED ORDER — LACTATED RINGERS IV SOLN: 500.0000 mL | INTRAVENOUS | Status: DC | PRN

## 2013-01-03 MED ORDER — PANTOPRAZOLE SODIUM 40 MG PO TBEC
40.0000 mg | DELAYED_RELEASE_TABLET | Freq: Every day | ORAL | Status: DC
  Administered 2013-01-03 – 2013-01-11 (×8): 40 mg via ORAL
  Filled 2013-01-03 (×10): qty 1

## 2013-01-03 MED ORDER — DIPHENHYDRAMINE HCL 50 MG/ML IJ SOLN: 12.5000 mg | INTRAMUSCULAR | Status: DC | PRN

## 2013-01-03 MED ORDER — FENTANYL 2.5 MCG/ML BUPIVACAINE 1/10 % EPIDURAL INFUSION (WH - ANES)
14.0000 mL/h | INTRAMUSCULAR | Status: DC | PRN
  Filled 2013-01-03 (×2): qty 125

## 2013-01-03 MED ORDER — CITRIC ACID-SODIUM CITRATE 334-500 MG/5ML PO SOLN
30.0000 mL | ORAL | Status: DC | PRN
  Filled 2013-01-03: qty 15

## 2013-01-03 MED ORDER — EPHEDRINE 5 MG/ML INJ: 10.0000 mg | INTRAVENOUS | Status: DC | PRN

## 2013-01-03 MED ORDER — ONDANSETRON HCL 4 MG/2ML IJ SOLN
4.0000 mg | Freq: Four times a day (QID) | INTRAMUSCULAR | Status: DC | PRN
  Administered 2013-01-03: 4 mg via INTRAVENOUS
  Filled 2013-01-03: qty 2

## 2013-01-03 MED ORDER — EPHEDRINE 5 MG/ML INJ
10.0000 mg | INTRAVENOUS | Status: DC | PRN
Start: 2013-01-03 — End: 2013-01-05
  Filled 2013-01-03: qty 4

## 2013-01-03 MED ORDER — ZOLPIDEM TARTRATE 5 MG PO TABS
5.0000 mg | ORAL_TABLET | Freq: Every evening | ORAL | Status: DC | PRN
  Administered 2013-01-03: 5 mg via ORAL
  Filled 2013-01-03: qty 1

## 2013-01-03 MED ORDER — PHENYLEPHRINE 40 MCG/ML (10ML) SYRINGE FOR IV PUSH (FOR BLOOD PRESSURE SUPPORT)
80.0000 ug | PREFILLED_SYRINGE | INTRAVENOUS | Status: DC | PRN
Start: 2013-01-03 — End: 2013-01-05
  Filled 2013-01-03: qty 5

## 2013-01-03 MED ORDER — MISOPROSTOL 25 MCG QUARTER TABLET
25.0000 ug | ORAL_TABLET | ORAL | Status: DC | PRN
  Administered 2013-01-03 (×2): 25 ug via VAGINAL
  Filled 2013-01-03 (×2): qty 0.25

## 2013-01-03 MED ORDER — LACTATED RINGERS IV SOLN: 500.0000 mL | Freq: Once | INTRAVENOUS | Status: DC

## 2013-01-03 MED ORDER — LACTATED RINGERS IV SOLN
INTRAVENOUS | Status: DC
  Administered 2013-01-03: 07:00:00 via INTRAVENOUS

## 2013-01-03 MED ORDER — OXYCODONE-ACETAMINOPHEN 5-325 MG PO TABS
1.0000 | ORAL_TABLET | ORAL | Status: DC | PRN
  Administered 2013-01-03: 1 via ORAL
  Administered 2013-01-03 – 2013-01-05 (×4): 2 via ORAL
  Administered 2013-01-05 (×2): 1 via ORAL
  Filled 2013-01-03: qty 2
  Filled 2013-01-03 (×2): qty 1
  Filled 2013-01-03 (×4): qty 2

## 2013-01-03 MED ORDER — OXYTOCIN BOLUS FROM INFUSION: 500.0000 mL | INTRAVENOUS | Status: DC

## 2013-01-03 NOTE — Progress Notes (Signed)
OB  [redacted]W[redacted]D  Having a little more discomfort and SOB today, +FM Afeb, VSS FHT- Cat I VE-Cl/30/-2, vtx Will ripen with cytotec, evaluate later today to assess progress

## 2013-01-03 NOTE — Discharge Summary (Signed)
Physician Discharge Summary  Patient ID: [name] [name] MRN: [id number] DOB/AGE: 09/30/1990 22 y.o.  Admit date: 12/24/2012 Discharge date: 12/27/2012  Admission Diagnoses:  IUP at 34+ weeks, hydronephrosis  Discharge Diagnoses:  Same Active Problems:   * No active hospital problems. *   Discharged Condition: fair  Hospital Course: Pt admitted for pain control after failed attempt at replacing nephrostomy tube by IR.  Dr. Retta Diones followed her and did right ureteral stent on 9-10.  She remained afebrile on Rocephin, had tachycardia and chest pain that improved some throughout her stay.  On 9-12 she was felt to be stable enough for discharge home.  Reassuring FHR throughout hospital stay, no OB issues.  Consults: urology  Treatments: surgery: right ureteral stent via cystoscopy  Discharge Exam: Blood pressure 138/82, pulse 117, temperature 98.6 F (37 C), temperature source Oral, resp. rate 22, height 5\' 2"  (1.575 m), weight 91.808 kg (202 lb 6.4 oz), last menstrual period 05/13/2012, SpO2 95.00%. General appearance: alert  Disposition: 01-Home or Self Care  Discharge Orders   Future Appointments Provider Department Dept Phone   01/09/2013 9:30 AM [facility] [phone]   01/09/2013 11:30 AM [facility] [phone]   Future Orders Complete By Expires   Discharge activity:  No Restrictions  As directed    Discharge diet:  No restrictions  As directed    Fetal Kick Count:  Lie on our left side for one hour after a meal, and count the number of times your baby kicks.  If it is less than 5 times, get up, move around and drink some juice.  Repeat the test 30 minutes later.  If it is still less than 5 kicks in an hour, notify your doctor.  As directed    Notify physician for a general feeling that "something is not right"  As directed    Notify physician for increase or change in vaginal discharge  As  directed    Notify physician for intestinal cramps, with or without diarrhea, sometimes described as "gas pain"  As directed    Notify physician for leaking of fluid  As directed    Notify physician for low, dull backache, unrelieved by heat or Tylenol  As directed    Notify physician for menstrual like cramps  As directed    Notify physician for pelvic pressure  As directed    Notify physician for uterine contractions.  These may be painless and feel like the uterus is tightening or the baby is  "balling up"  As directed    Notify physician for vaginal bleeding  As directed    PRETERM LABOR:  Includes any of the follwing symptoms that occur between 20 - [redacted] weeks gestation.  If these symptoms are not stopped, preterm labor can result in preterm delivery, placing your baby at risk  As directed        Medication List    STOP taking these medications       oxyCODONE-acetaminophen 5-325 MG per tablet  Commonly known as:  PERCOCET/ROXICET      TAKE these medications       ondansetron 8 MG tablet  Commonly known as:  ZOFRAN  Take 8 mg by mouth every 8 (eight) hours as needed for nausea.     pantoprazole 40 MG tablet  Commonly known as:  PROTONIX  Take 40 mg by mouth daily.     ranitidine 75 MG tablet  Commonly known  as:  ZANTAC  Take 75 mg by mouth daily.           Follow-up Information   Follow up with [facility]. (call for appointment for next week)       Signed: Ruth Kovich D 01/03/2013, 7:53 AM

## 2013-01-03 NOTE — Plan of Care (Signed)
Problem: Consults Goal: Birthing Suites Patient Information Press F2 to bring up selections list Outcome: Completed/Met Date Met:  01/03/13  Pt < [redacted] weeks EGA and Inpatient induction

## 2013-01-03 NOTE — Progress Notes (Signed)
Having some ctx, increased swelling in legs Afeb, VSS FHT- Cat I, irreg ctx VE-tight FT/40/-2, vtx, foley cath inserted into cervix Will see how cervix does with foley, check CMP due to kidney issues and edema

## 2013-01-03 NOTE — Progress Notes (Signed)
Care resumed.

## 2013-01-03 NOTE — Progress Notes (Signed)
Patient transferred to [facility] via [facility].  

## 2013-01-04 ENCOUNTER — Encounter (HOSPITAL_COMMUNITY): Payer: Self-pay | Admitting: Anesthesiology

## 2013-01-04 ENCOUNTER — Inpatient Hospital Stay (HOSPITAL_COMMUNITY): Payer: Medicaid Other | Admitting: Anesthesiology

## 2013-01-04 DIAGNOSIS — N179 Acute kidney failure, unspecified: Secondary | ICD-10-CM | POA: Diagnosis present

## 2013-01-04 LAB — BASIC METABOLIC PANEL
BUN: 19 mg/dL (ref 6–23)
CO2: 21 mEq/L (ref 19–32)
Calcium: 8.3 mg/dL — ABNORMAL LOW (ref 8.4–10.5)
Chloride: 102 mEq/L (ref 96–112)
Creatinine, Ser: 2.88 mg/dL — ABNORMAL HIGH (ref 0.50–1.10)
Creatinine, Ser: 2.86 mg/dL — ABNORMAL HIGH (ref 0.50–1.10)
GFR calc Af Amer: 25 mL/min — ABNORMAL LOW (ref >90)
GFR calc non Af Amer: 22 mL/min — ABNORMAL LOW (ref >90)
GFR calc non Af Amer: 22 mL/min — ABNORMAL LOW (ref >90)
Glucose, Bld: 80 mg/dL (ref 70–99)
Potassium: 3.5 mEq/L (ref 3.5–5.1)
Sodium: 139 mEq/L (ref 135–145)

## 2013-01-04 LAB — CBC
MCV: 74.2 fL — ABNORMAL LOW (ref 78.0–100.0)
Platelets: 396 10*3/uL (ref 150–400)
RBC: 4.11 MIL/uL (ref 3.87–5.11)
RDW: 14 % (ref 11.5–15.5)
WBC: 21.8 10*3/uL — ABNORMAL HIGH (ref 4.0–10.5)

## 2013-01-04 MED ORDER — OXYTOCIN 40 UNITS IN LACTATED RINGERS INFUSION - SIMPLE MED
1.0000 m[IU]/min | INTRAVENOUS | Status: DC
  Administered 2013-01-04: 1 m[IU]/min via INTRAVENOUS

## 2013-01-04 MED ORDER — LIDOCAINE HCL (PF) 1 % IJ SOLN
INTRAMUSCULAR | Status: DC | PRN
  Administered 2013-01-04 (×2): 3 mL

## 2013-01-04 MED ORDER — OXYTOCIN 40 UNITS IN LACTATED RINGERS INFUSION - SIMPLE MED
1.0000 m[IU]/min | INTRAVENOUS | Status: DC
  Administered 2013-01-04: 6 m[IU]/min via INTRAVENOUS
  Administered 2013-01-04: 4 m[IU]/min via INTRAVENOUS
  Administered 2013-01-04: 8 m[IU]/min via INTRAVENOUS
  Administered 2013-01-04: 4 m[IU]/min via INTRAVENOUS
  Administered 2013-01-04: 2 m[IU]/min via INTRAVENOUS
  Filled 2013-01-04: qty 1000

## 2013-01-04 MED ORDER — FENTANYL 2.5 MCG/ML BUPIVACAINE 1/10 % EPIDURAL INFUSION (WH - ANES)
INTRAMUSCULAR | Status: DC | PRN
  Administered 2013-01-04: 14 mL/h via EPIDURAL
  Administered 2013-01-04: 14 mL/h

## 2013-01-04 MED ORDER — BUTORPHANOL TARTRATE 1 MG/ML IJ SOLN
1.0000 mg | INTRAMUSCULAR | Status: DC | PRN
  Administered 2013-01-04: 1 mg via INTRAVENOUS
  Filled 2013-01-04: qty 1

## 2013-01-04 NOTE — Progress Notes (Signed)
Having some flank and chest pain, not too uncomfortable, foley bulb still in place Afeb, VSS FHT- Cat I VE-foley in place, cervix is at least 1 cm K+ up to 3.9, Cre up to 2.88 Will try pitocin to get foley through cervix so can AROM.  Continue Zosyn, d/c K+.

## 2013-01-04 NOTE — Anesthesia Procedure Notes (Signed)
Epidural Patient location during procedure: OB Start time: 01/04/2013 4:25 PM End time: 01/04/2013 4:45 PM  Staffing Anesthesiologist: Lewie Loron R  Preanesthetic Checklist Completed: patient identified, pre-op evaluation, timeout performed, IV checked, risks and benefits discussed and monitors and equipment checked  Epidural Prep: DuraPrep Patient monitoring: heart rate, continuous pulse ox and blood pressure Injection technique: LOR air and LOR saline  Needle:  Needle type: Tuohy  Needle gauge: 17 G Needle length: 9 cm Needle insertion depth: 8 cm Catheter type: closed end flexible Catheter size: 19 Gauge Catheter at skin depth: 14 cm Test dose: negative  Assessment Sensory level: T8 Events: blood not aspirated, injection not painful, no injection resistance, negative IV test and no paresthesia  Additional Notes Reason for block:procedure for pain

## 2013-01-04 NOTE — Progress Notes (Signed)
PULMONARY  / CRITICAL CARE MEDICINE  Name: [name] [name] MRN: [id number] DOB: 1990/05/04    ADMISSION DATE:  12/30/2012 CONSULTATION DATE:  12/30/2012  REFERRING MD :  Tawanna Cooler Meisinger  CHIEF COMPLAINT: Short of breath  BRIEF PATIENT DESCRIPTION:  22 y/o female in 34th week of pregnancy had Rt hydronephrosis s/p percutaneous nephrostomy tube.  She had attempted tube exchange on 9/09 >> complicated by contrast extravasation.  Developed progressive dyspnea, Rt pleuritic chest pain, hypoxia, and fever up to 103F.  Presented again to [facility] ER and found to have large rt pleural effusion.  PCCM consulted to assess pleural effusion.  SIGNIFICANT EVENTS: 9/09 - unsuccessful percutaneous nephrostomy tube exchange 9/10 - urology consulted, placement of Rt ureteral double J stent 9/11 - oxygen desaturation noted >> CXR shows ATX 9/12 - d/c home 9/15 - present to [facility] ED with dyspnea >> Large Rt pleural effusion, PCCM consulted 9/16 - unable to perform thora, not enough fluid per IR, TCTS consulted for loculated Rt pleural effusion  STUDIES:  9/16 CT chest >> mod Rt pleural effusion partially loculated, consolidation with air bronchograms RML and RLL  LINES / TUBES: 10/30/12 Rt nephrostomy tube >> out  CULTURES: Blood 9/15 >> GPC in clusters >>  ANTIBIOTICS: Rocephin 9/09 >> 9/11 Clindamycin 9/09 >> 9/09 Keflex 9/11 >> 9/15 Zosyn 9/15 >> Vancomycin 9/15 >>   SUBJECTIVE: Doing well pulm wise, waiting on delivery  VITAL SIGNS: Temp:  [97.8 F (36.6 C)-98.6 F (37 C)] 98.1 F (36.7 C) (09/20 0625) Pulse Rate:  [59-90] 59 (09/20 0758) Resp:  [16-24] 24 (09/20 0758) BP: (127-148)/(65-89) 138/79 mmHg (09/20 0758) SpO2:  [94 %] 94 % (09/19 2002) Weight:  [98.629 kg (217 lb 7 oz)] 98.629 kg (217 lb 7 oz) (09/20 1048) Room air  PHYSICAL EXAMINATION: General: no distress Neuro:  Alert, normal strength, moves all extremities HEENT:  No sinus tenderness Cardiovascular:  s1s2 regular Lungs:  decreased breath sounds on Rt, few scattered rhonchi L, no wheeze Abdomen:  Gravid uterus, non tender Musculoskeletal: minimal ankle edema Skin:  Multiple tatoos, no rashes  Labs: CBC Recent Labs     01/03/13  0420  01/03/13  0930  01/04/13  0620  WBC  29.2*  29.5*  21.8*  HGB  9.2*  9.2*  9.9*  HCT  27.6*  28.1*  30.5*  PLT  412*  414*  396   BMET Recent Labs     01/03/13  0420  01/03/13  1812  01/04/13  0620  NA  139  138  139  K  3.3*  2.8*  3.9  CL  105  103  104  CO2  19  21  21   BUN  16  17  18   CREATININE  2.45*  2.67*  2.88*  GLUCOSE  110*  79  70   Electrolytes Recent Labs     01/03/13  0420  01/03/13  1812  01/04/13  0620  CALCIUM  8.4  8.3*  8.3*    Sepsis Markers Recent Labs     01/03/13  0420  PROCALCITON  2.72   Liver Enzymes Recent Labs     01/02/13  0410  01/03/13  1812  AST  11  12  ALT  6  6  ALKPHOS  163*  146*  BILITOT  0.1*  0.1*  ALBUMIN  1.8*  1.8*    Imaging No results found.    ASSESSMENT / PLAN:  PULMONARY: A: Rt sided loculated effusion with  PNA >> most likely from aspiration. P: -[facility] for [facility]  CARDIAC A: SIRS/sepsis >> improved. P: -monitor hemodynamics  RENAL A: Rt hydronephrosis of pregnancy s/p Rt ureteral stent Acute renal insufficiency 9/18 >> ? From vancomycin>>Cr sl worse AM 9/20 P: -monitor renal fx, urine outpt, electrolytes daily -urology following ([facility]) - ?need for stent adjustment   HEMATOLOGY A:  Leukocytosis >> likely related to infection Anemia of pregnancy. P: -f/u CBC 9/19 -SCD for DVT prevention  GASTROENTEROLOGY A: Reflux in pregnancy. Constipation P: -protonix  -tums as needed -Miralax prn  OBSTETRICS A: 35 week pregnancy. P: -now in birthing area of [facility]. Per OB   INFECTION A: Aspiration pneumonia. GPC in cluster in one of two blood cultures >> likely contaminate; low concern for MRSA. P: -D5/x zosyn -d/c'd  vanc  with increased Scr -f/u blood cx  ENDOCRINE A: No acute issues. P: -Monitor blood sugar on BMET  NEUROLOGY A: Pain control. P: -prn tylenol, percocet, dilaudid  Updated family at bedside.  Dorcas Carrow Beeper  [phone]  Cell  [phone]  If no response or cell goes to voicemail, call beeper [phone]  [facility] 01/04/2013, 11:21 AM After 3pm call: [phone]

## 2013-01-04 NOTE — Progress Notes (Signed)
Fairly comfortable with epidural Afeb, VSS FHT- Cat I VE-2/90/-1, vtx, foley bulb removed, AROM clear Continue pitocin and monitor progress, continue Zosyn

## 2013-01-04 NOTE — Anesthesia Preprocedure Evaluation (Addendum)
Anesthesia Evaluation  Patient identified by MRN, date of birth, ID band Patient awake  General Assessment Comment:.  Anxiety     .  Bipolar 1 disorder     .  Post traumatic stress disorder (PTSD)     .  Chronic pain         muscle spasms- entire body   .  Infection         UTI   .  Fracture, ankle         left- x2   .  Depression         doing fine now     Reviewed: Allergy & Precautions, H&P , NPO status , Patient's Chart, lab work & pertinent test results  Airway Mallampati: II TM Distance: >3 FB Neck ROM: Full    Dental no notable dental hx.    Pulmonary pneumonia -, unresolved,  R pleural effusion and consolidation/atelectasis on CXR.  breath sounds clear to auscultation  Pulmonary exam normal       Cardiovascular negative cardio ROS  Rhythm:Regular Rate:Normal     Neuro/Psych PSYCHIATRIC DISORDERS Anxiety Depression negative neurological ROS     GI/Hepatic negative GI ROS, Neg liver ROS,   Endo/Other  Morbid obesity  Renal/GU Renal diseaseAttempted right nephrostomy tube, extravasation of contrast.     Musculoskeletal negative musculoskeletal ROS (+)   Abdominal (+) + obese,   Peds  Hematology negative hematology ROS (+)   Anesthesia Other Findings   Reproductive/Obstetrics (+) Pregnancy IUP at 33 5/7 with EDC 10-23                            Anesthesia Physical  Anesthesia Plan  ASA: III  Anesthesia Plan: Epidural   Post-op Pain Management:    Induction:   Airway Management Planned:   Additional Equipment:   Intra-op Plan:   Post-operative Plan:   Informed Consent: I have reviewed the patients History and Physical, chart, labs and discussed the procedure including the risks, benefits and alternatives for the proposed anesthesia with the patient or authorized representative who has indicated his/her understanding and acceptance.     Plan Discussed with:    Anesthesia Plan Comments: (Extensive chart review and discussion with patient. High risk. Discussed infectious complications of epidurals in pt with active infection. I also discussed with one of my partners. Pt accepts risks and desires epidural. No fevers, negative blood cultures. )       Anesthesia Quick Evaluation

## 2013-01-05 LAB — BASIC METABOLIC PANEL
BUN: 20 mg/dL (ref 6–23)
CO2: 20 mEq/L (ref 19–32)
Calcium: 8.2 mg/dL — ABNORMAL LOW (ref 8.4–10.5)
Chloride: 102 mEq/L (ref 96–112)
Creatinine, Ser: 2.98 mg/dL — ABNORMAL HIGH (ref 0.50–1.10)

## 2013-01-05 LAB — CULTURE, BLOOD (ROUTINE X 2): Culture: NO GROWTH

## 2013-01-05 MED ORDER — PRENATAL MULTIVITAMIN CH
1.0000 | ORAL_TABLET | Freq: Every day | ORAL | Status: DC
  Filled 2013-01-05 (×4): qty 1

## 2013-01-05 MED ORDER — WITCH HAZEL-GLYCERIN EX PADS: 1.0000 "application " | MEDICATED_PAD | CUTANEOUS | Status: DC | PRN

## 2013-01-05 MED ORDER — SENNOSIDES-DOCUSATE SODIUM 8.6-50 MG PO TABS
2.0000 | ORAL_TABLET | Freq: Every day | ORAL | Status: DC
  Administered 2013-01-07: 2 via ORAL
  Filled 2013-01-05 (×2): qty 2

## 2013-01-05 MED ORDER — ZOLPIDEM TARTRATE 5 MG PO TABS
5.0000 mg | ORAL_TABLET | Freq: Every evening | ORAL | Status: DC | PRN
  Administered 2013-01-07: 5 mg via ORAL
  Filled 2013-01-05: qty 1

## 2013-01-05 MED ORDER — METHYLERGONOVINE MALEATE 0.2 MG/ML IJ SOLN
0.2000 mg | INTRAMUSCULAR | Status: DC | PRN
  Filled 2013-01-05: qty 1

## 2013-01-05 MED ORDER — BENZOCAINE-MENTHOL 20-0.5 % EX AERO
1.0000 "application " | INHALATION_SPRAY | CUTANEOUS | Status: DC | PRN
  Filled 2013-01-05 (×2): qty 56

## 2013-01-05 MED ORDER — SIMETHICONE 80 MG PO CHEW
80.0000 mg | CHEWABLE_TABLET | ORAL | Status: DC | PRN
  Administered 2013-01-07: 80 mg via ORAL
  Filled 2013-01-05: qty 1

## 2013-01-05 MED ORDER — OXYCODONE-ACETAMINOPHEN 5-325 MG PO TABS
1.0000 | ORAL_TABLET | ORAL | Status: DC | PRN
  Administered 2013-01-05 – 2013-01-06 (×8): 1 via ORAL
  Administered 2013-01-07 (×3): 2 via ORAL
  Filled 2013-01-05 (×2): qty 1
  Filled 2013-01-05: qty 2
  Filled 2013-01-05 (×3): qty 1
  Filled 2013-01-05: qty 2
  Filled 2013-01-05 (×5): qty 1

## 2013-01-05 MED ORDER — ONDANSETRON HCL 4 MG/2ML IJ SOLN
4.0000 mg | INTRAMUSCULAR | Status: DC | PRN
  Administered 2013-01-06 – 2013-01-07 (×2): 4 mg via INTRAVENOUS
  Filled 2013-01-05 (×2): qty 2

## 2013-01-05 MED ORDER — METHYLERGONOVINE MALEATE 0.2 MG PO TABS
0.2000 mg | ORAL_TABLET | ORAL | Status: DC | PRN
  Filled 2013-01-05: qty 1

## 2013-01-05 MED ORDER — MEASLES, MUMPS & RUBELLA VAC ~~LOC~~ INJ
0.5000 mL | INJECTION | Freq: Once | SUBCUTANEOUS | Status: DC
  Filled 2013-01-05: qty 0.5

## 2013-01-05 MED ORDER — MAGNESIUM HYDROXIDE 400 MG/5ML PO SUSP: 30.0000 mL | ORAL | Status: DC | PRN

## 2013-01-05 MED ORDER — DIPHENHYDRAMINE HCL 25 MG PO CAPS: 25.0000 mg | ORAL_CAPSULE | Freq: Four times a day (QID) | ORAL | Status: DC | PRN

## 2013-01-05 MED ORDER — LANOLIN HYDROUS EX OINT: TOPICAL_OINTMENT | CUTANEOUS | Status: DC | PRN

## 2013-01-05 MED ORDER — ONDANSETRON HCL 4 MG PO TABS: 4.0000 mg | ORAL_TABLET | ORAL | Status: DC | PRN

## 2013-01-05 MED ORDER — TETANUS-DIPHTH-ACELL PERTUSSIS 5-2.5-18.5 LF-MCG/0.5 IM SUSP
0.5000 mL | Freq: Once | INTRAMUSCULAR | Status: DC
  Filled 2013-01-05: qty 0.5

## 2013-01-05 MED ORDER — DIBUCAINE 1 % RE OINT
1.0000 "application " | TOPICAL_OINTMENT | RECTAL | Status: DC | PRN
  Filled 2013-01-05: qty 28

## 2013-01-05 NOTE — Progress Notes (Signed)
PULMONARY  / CRITICAL CARE MEDICINE  Name: [name] [name] MRN: [id number] DOB: [birth year]    ADMISSION DATE:  12/30/2012 CONSULTATION DATE:  12/30/2012  REFERRING MD :  Tawanna Cooler Meisinger  CHIEF COMPLAINT: Short of breath  BRIEF PATIENT DESCRIPTION:  22 y/o female in 34th week of pregnancy had Rt hydronephrosis s/p percutaneous nephrostomy tube.  She had attempted tube exchange on 9/09 >> complicated by contrast extravasation.  Developed progressive dyspnea, Rt pleuritic chest pain, hypoxia, and fever up to 103F.  Presented again to [facility] ER and found to have large rt pleural effusion.  PCCM consulted to assess pleural effusion.  SIGNIFICANT EVENTS: 9/09 - unsuccessful percutaneous nephrostomy tube exchange 9/10 - urology consulted, placement of Rt ureteral double J stent 9/11 - oxygen desaturation noted >> CXR shows ATX 9/12 - d/c home 9/15 - present to [facility] ED with dyspnea >> Large Rt pleural effusion, PCCM consulted 9/16 - unable to perform thora, not enough fluid per IR, TCTS consulted for loculated Rt pleural effusion  STUDIES:  9/16 CT chest >> mod Rt pleural effusion partially loculated, consolidation with air bronchograms RML and RLL  LINES / TUBES: 10/30/12 Rt nephrostomy tube >> out  CULTURES: Blood 9/15 >> GPC in clusters >>  ANTIBIOTICS: Rocephin 9/09 >> 9/11 Clindamycin 9/09 >> 9/09 Keflex 9/11 >> 9/15 Zosyn 9/15 >> Vancomycin 9/15 >>   SUBJECTIVE: Doing well pulm wise, waiting on delivery  VITAL SIGNS: Temp:  [97.9 F (36.6 C)-99.4 F (37.4 C)] 98.1 F (36.7 C) (09/21 1111) Pulse Rate:  [25-162] 81 (09/21 1111) Resp:  [18-22] 18 (09/21 1111) BP: (119-159)/(51-102) 145/80 mmHg (09/21 1111) SpO2:  [84 %-100 %] 98 % (09/21 1038) Room air  PHYSICAL EXAMINATION: General: no distress Neuro:  Alert, normal strength, moves all extremities HEENT:  No sinus tenderness Cardiovascular:  s1s2 regular Lungs: decreased breath sounds on Rt, few scattered rhonchi L, no  wheeze Abdomen:  Gravid uterus, non tender Musculoskeletal: minimal ankle edema Skin:  Multiple tatoos, no rashes  Labs: CBC Recent Labs     01/03/13  0420  01/03/13  0930  01/04/13  0620  WBC  29.2*  29.5*  21.8*  HGB  9.2*  9.2*  9.9*  HCT  27.6*  28.1*  30.5*  PLT  412*  414*  396   BMET Recent Labs     01/04/13  0620  01/04/13  1602  01/05/13  0518  NA  139  136  135  K  3.9  3.5  3.6  CL  104  102  102  CO2  21  20  20   BUN  18  19  20   CREATININE  2.88*  2.86*  2.98*  GLUCOSE  70  80  88   Electrolytes Recent Labs     01/04/13  0620  01/04/13  1602  01/05/13  0518  CALCIUM  8.3*  8.7  8.2*    Sepsis Markers Recent Labs     01/03/13  0420  PROCALCITON  2.72   Liver Enzymes Recent Labs     01/03/13  1812  AST  12  ALT  6  ALKPHOS  146*  BILITOT  0.1*  ALBUMIN  1.8*    Imaging No results found.    ASSESSMENT / PLAN:  PULMONARY: A: Rt sided loculated effusion with PNA >> most likely from aspiration. P: -[facility]  9/22  CARDIAC A: SIRS/sepsis >> improved. P: -monitor hemodynamics  RENAL A: Rt hydronephrosis of  pregnancy s/p Rt ureteral stent Acute renal insufficiency 9/18 >> ? From vancomycin>>Cr sl worse AM 9/21 P: -monitor renal fx, urine outpt, electrolytes daily -urology following ([facility]) - -hopefully Cr will come down now that pt is delivered  HEMATOLOGY A:  Leukocytosis >> likely related to infection Anemia of pregnancy. P: -f/u CBC 9/21 -SCD for DVT prevention  GASTROENTEROLOGY A: Reflux in pregnancy. Constipation P: -protonix  -tums as needed -Miralax prn  OBSTETRICS A: 35 week pregnancy. S/p delivery AM 9/21 P: -ready for tfr to [facility] AM 9/22 for planned VATS later this week    INFECTION A: Aspiration pneumonia. GPC in cluster in one of two blood cultures >> likely contaminate; low concern for MRSA. P: -D6/x zosyn -d/c'd  vanc with increased Scr -f/u blood  cx  ENDOCRINE A: No acute issues. P: -Monitor blood sugar on BMET  NEUROLOGY A: Pain control. P: -prn tylenol, percocet,  Updated family at bedside.  PCCM will arrange TFR to [facility] 9/22 AM  Luisa Hart WrightMD Beeper  [phone]  Cell  [phone]  If no response or cell goes to voicemail, call beeper [phone]  [facility] 01/05/2013, 12:14 PM After 3pm call: [phone]

## 2013-01-05 NOTE — Progress Notes (Signed)
Clinical Social Work Department PSYCHOSOCIAL ASSESSMENT - MATERNAL/CHILD 01/05/2013  Patient:  [name] [name]  Account Number:  [id number]  Admit Date:  12/30/2012  Childs Name:   [name] [name]    Clinical Social Worker:  Alysabeth Scalia, LCSW   Date/Time:  01/05/2013 09:30 AM  Date Referred:  01/03/2013   Referral source  Physician     Referred reason  Psychosocial assessment  Depression/Anxiety   Other referral source:    I:  FAMILY / HOME ENVIRONMENT Child's legal guardian:  PARENT  Guardian - Name Guardian - Age Guardian - Address  [name] [name] 23    Other household support members/support persons Other support:   Maternal Grandfather    II  PSYCHOSOCIAL DATA Information Source:    Financial and Community Resources Employment:   Both parents are employed   Financial resources:  Medicaid If Medicaid - County:    School / Grade:   Maternity Care Coordinator / Child Services Coordination / Early Interventions:  Cultural issues impacting care:    III  STRENGTHS Strengths  Adequate Resources  Home prepared for Child (including basic supplies)  Supportive family/friends   Strength comment:    IV  RISK FACTORS AND CURRENT PROBLEMS Current Problem:       V  SOCIAL WORK ASSESSMENT Acknowledged order for Social Work consult to assess pt's history of "Depression, bipolar, and anxiety."  Mother was receptive to social work intervention.  Mother reports hx of mental illness.  "I have had many years of talk therapy".    She denies any hx of psychiatric hospitalization and states that she was diagnosed and treated in an out patient setting.  Informed that she has taken medication in the past to control the symptoms, but have not been on any psychiatric medication for the past two and a half years.  She denies any hx of substance abuse.  FOB and grandfather were present and very attentive to patient and mother.  Parents reside with maternal grandfather.  Mother states  that she was healthy prior to pregnancy, but have experienced various medical complication during pregnancy.  She seems to have a positive attitude and understands that newborn will be ready to go home before her.  Father stated that he will care for newborn while mother is hospitalized.  Mother seems to have a good support network.  Discussed signs/symptoms of PP depression with mother, and provided resources if needed.   She was receptive to the information.  She reports extensive family support.   No acute social concerns reported or noted at this time.  Parents informed of social work availability.     VI SOCIAL WORK PLAN Social Work Plan  No Further Intervention Required / No Barriers to Discharge   Edee Nifong J, LCSW  

## 2013-01-05 NOTE — Progress Notes (Signed)
PPD #0 Doing ok, abd and flank less sore, chest a little better, breathing better Afeb, VSS Fundus firm Cre up to 2.98 Continue routine postpartum care.  Will plan on transfer to [facility] tomorrow for eventual VATS.  Will continue to monitor Cre closely, hydronephrosis should improve due to delivery.

## 2013-01-05 NOTE — Anesthesia Postprocedure Evaluation (Signed)
Anesthesia Post Note  Patient: [name] [name]  Procedure(s) Performed: * No procedures listed *  Anesthesia type: Epidural  Patient location: Mother/Baby  Post pain: Pain level controlled  Post assessment: Post-op Vital signs reviewed  Last Vitals:  Filed Vitals:   01/05/13 1607  BP: 115/82  Pulse: 72  Temp: 37.2 C  Resp: 18    Post vital signs: Reviewed  Level of consciousness:alert  Complications: No apparent anesthesia complications

## 2013-01-06 ENCOUNTER — Inpatient Hospital Stay (HOSPITAL_COMMUNITY): Payer: Medicaid Other

## 2013-01-06 ENCOUNTER — Encounter (HOSPITAL_COMMUNITY): Payer: Self-pay | Admitting: Radiology

## 2013-01-06 DIAGNOSIS — N179 Acute kidney failure, unspecified: Secondary | ICD-10-CM

## 2013-01-06 LAB — CBC
HCT: 23.8 % — ABNORMAL LOW (ref 36.0–46.0)
Hemoglobin: 7.8 g/dL — ABNORMAL LOW (ref 12.0–15.0)
MCH: 24.1 pg — ABNORMAL LOW (ref 26.0–34.0)
MCHC: 32.8 g/dL (ref 30.0–36.0)
RDW: 13.9 % (ref 11.5–15.5)
WBC: 16.6 10*3/uL — ABNORMAL HIGH (ref 4.0–10.5)

## 2013-01-06 LAB — BASIC METABOLIC PANEL
BUN: 21 mg/dL (ref 6–23)
CO2: 21 mEq/L (ref 19–32)
Calcium: 7.9 mg/dL — ABNORMAL LOW (ref 8.4–10.5)
Chloride: 108 mEq/L (ref 96–112)
GFR calc non Af Amer: 23 mL/min — ABNORMAL LOW (ref >90)
Potassium: 3.5 mEq/L (ref 3.5–5.1)
Sodium: 138 mEq/L (ref 135–145)

## 2013-01-06 NOTE — Progress Notes (Addendum)
PULMONARY  / CRITICAL CARE MEDICINE  Name: [name] [name] MRN: [id number] DOB: [birth year]    ADMISSION DATE:  12/30/2012 CONSULTATION DATE:  12/30/2012  REFERRING MD :  Tawanna Cooler Meisinger  CHIEF COMPLAINT: Short of breath  BRIEF PATIENT DESCRIPTION:  22 y/o female in 34th week of pregnancy had Rt hydronephrosis s/p percutaneous nephrostomy tube.  She had attempted tube exchange on 9/09 >> complicated by contrast extravasation.  Developed progressive dyspnea, Rt pleuritic chest pain, hypoxia, and fever up to 103F.  Presented again to [facility] ER and found to have large rt pleural effusion.  PCCM consulted to assess pleural effusion.  SIGNIFICANT EVENTS: 9/09 - unsuccessful percutaneous nephrostomy tube exchange 9/10 - urology consulted, placement of Rt ureteral double J stent 9/11 - oxygen desaturation noted >> CXR shows ATX 9/12 - d/c home 9/15 - present to [facility] ED with dyspnea >> Large Rt pleural effusion, PCCM consulted 9/16 - unable to perform thora, not enough fluid per IR, TCTS consulted for loculated Rt pleural effusion  STUDIES:  9/16 CT chest >> mod Rt pleural effusion partially loculated, consolidation with air bronchograms RML and RLL  LINES / TUBES: 10/30/12 Rt nephrostomy tube >> out  CULTURES: Blood 9/15 >> GPC in clusters >>1/2 coag neg staff  ANTIBIOTICS: Rocephin 9/09 >> 9/11 Clindamycin 9/09 >> 9/09 Keflex 9/11 >> 9/15 Zosyn 9/15 >> Vancomycin 9/15 >>off    SUBJECTIVE: S/p vaginal delivery 9/21.  Still c/o some SOB, cough, wheeze. General malaise.   VITAL SIGNS: Temp:  [98.4 F (36.9 C)-98.9 F (37.2 C)] 98.4 F (36.9 C) (09/22 0800) Pulse Rate:  [67-94] 67 (09/22 0800) Resp:  [18] 18 (09/22 0800) BP: (115-148)/(73-88) 148/80 mmHg (09/22 0800) Room air  PHYSICAL EXAMINATION: General: no distress Neuro:  Alert, normal strength, moves all extremities HEENT:  No sinus tenderness Cardiovascular:  s1s2 regular Lungs: decreased breath sounds on Rt, few scattered  rhonchi L, no wheeze Abdomen:  Post partum, non tender, hypoactive bs, fundus firm  Musculoskeletal: 2+ pitting edema  Skin:  Multiple tatoos, no rashes  Labs: CBC Recent Labs     01/04/13  0620  01/06/13  0542  WBC  21.8*  16.6*  HGB  9.9*  7.8*  HCT  30.5*  23.8*  PLT  396  364   BMET Recent Labs     01/04/13  1602  01/05/13  0518  01/06/13  0542  NA  136  135  138  K  3.5  3.6  3.5  CL  102  102  108  CO2  20  20  21   BUN  19  20  21   CREATININE  2.86*  2.98*  2.80*  GLUCOSE  80  88  85   Electrolytes Recent Labs     01/04/13  1602  01/05/13  0518  01/06/13  0542  CALCIUM  8.7  8.2*  7.9*    Sepsis Markers No results found for this basename: LACTICACIDVEN, PROCALCITON, O2SATVEN,  in the last 72 hours Liver Enzymes Recent Labs     01/03/13  1812  AST  12  ALT  6  ALKPHOS  146*  BILITOT  0.1*  ALBUMIN  1.8*    Imaging No results found.    ASSESSMENT / PLAN:  PULMONARY: A: Rt sided loculated effusion with PNA >> most likely from aspiration. P: -[facility] to [facility]  9/23 -VATS planned 9/24 per Hendricks   CARDIAC A: SIRS/sepsis >> improved. P: -monitor hemodynamics  RENAL A: Rt  hydronephrosis of pregnancy s/p Rt ureteral stent Acute renal insufficiency 9/18 >> ? From vancomycin>>Cr sl worse 9/22 P: -monitor renal fx, urine outpt, electrolytes daily -urology following (Dalsthedt) - spoke with urology to f/u again 9/22 -hopefully Cr will come down now that pt is delivered  HEMATOLOGY A:  Leukocytosis >> likely related to infection Anemia of pregnancy. P: -f/u CBC 9/23 -SCD for DVT prevention  GASTROENTEROLOGY A: Reflux in pregnancy. Constipation P: -protonix  -tums as needed -Miralax prn  OBSTETRICS A: 35 week pregnancy. S/p delivery AM 9/21 P: -ready for tfr to [facility] AM 9/23 for planned VATS 9/24  INFECTION A: Aspiration pneumonia. GPC in cluster in one of two blood cultures >> likely contaminate; low concern for  MRSA. P: -D7/x zosyn -d/c'd  vanc with increased Scr  ENDOCRINE A: No acute issues. P: -Monitor blood sugar on BMET  NEUROLOGY A: Pain control. P: -prn tylenol, percocet,  Updated family at bedside.  PCCM will arrange TFR to [facility] 9/23 once baby d/c.    Danford Bad, NP 01/06/2013  11:31 AM Pager: [phone] or [phone]  *Care during the described time interval was provided by me and/or other providers on the critical care team. I have reviewed this patient's available data, including medical history, events of note, physical examination and test results as part of my evaluation.  I agree with this note per NP [place].  [facility] will [facility] to [facility]. Plan VATS 9/24 per Alton Revere WrightMD

## 2013-01-06 NOTE — Progress Notes (Signed)
PPD #1 Doing well, a little sore, bleeding ok, baby doing well in room Afeb, VSS Fundus firm Cre down to 2.8, Hgb 9.9 to 7.8 She is stable to transfer to [facility], my only instructions are for pelvic rest, she is bottle feeding-no meds to dry up milk-just support and ice prn

## 2013-01-07 LAB — BASIC METABOLIC PANEL
BUN: 23 mg/dL (ref 6–23)
GFR calc Af Amer: 28 mL/min — ABNORMAL LOW (ref >90)
GFR calc non Af Amer: 24 mL/min — ABNORMAL LOW (ref >90)
Potassium: 4.4 mEq/L (ref 3.5–5.1)
Sodium: 138 mEq/L (ref 135–145)

## 2013-01-07 LAB — CBC
HCT: 25.6 % — ABNORMAL LOW (ref 36.0–46.0)
Hemoglobin: 7.7 g/dL — ABNORMAL LOW (ref 12.0–15.0)
MCH: 24.1 pg — ABNORMAL LOW (ref 26.0–34.0)
MCHC: 32.6 g/dL (ref 30.0–36.0)
MCV: 75.1 fL — ABNORMAL LOW (ref 78.0–100.0)
Platelets: 479 10*3/uL — ABNORMAL HIGH (ref 150–400)
RBC: 3.19 MIL/uL — ABNORMAL LOW (ref 3.87–5.11)
RBC: 3.41 MIL/uL — ABNORMAL LOW (ref 3.87–5.11)
RDW: 14 % (ref 11.5–15.5)
WBC: 17 10*3/uL — ABNORMAL HIGH (ref 4.0–10.5)

## 2013-01-07 MED ORDER — FENTANYL CITRATE 0.05 MG/ML IJ SOLN
50.0000 ug | INTRAMUSCULAR | Status: DC | PRN
  Administered 2013-01-07: 50 ug via INTRAVENOUS
  Filled 2013-01-07 (×2): qty 2

## 2013-01-07 MED ORDER — ALPRAZOLAM 0.25 MG PO TABS
0.2500 mg | ORAL_TABLET | Freq: Two times a day (BID) | ORAL | Status: DC | PRN
  Administered 2013-01-07 – 2013-01-08 (×2): 0.25 mg via ORAL
  Filled 2013-01-07 (×2): qty 1

## 2013-01-07 MED ORDER — LACTATED RINGERS IV SOLN
INTRAVENOUS | Status: DC
  Administered 2013-01-08: 17:00:00 via INTRAVENOUS

## 2013-01-07 MED ORDER — MIDAZOLAM HCL 2 MG/2ML IJ SOLN: 1.0000 mg | INTRAMUSCULAR | Status: DC | PRN

## 2013-01-07 NOTE — Progress Notes (Signed)
Pt. Is ready to be transferred to 5W-39. Report given to [facility], Charity fundraiser. All belongings were sent home with the FOB. Baby was sent to [facility]. Pt. Tearful and anxious, but cooperative and ready to be treated at [facility].

## 2013-01-07 NOTE — Progress Notes (Signed)
01/07/13 1300  Clinical Encounter Type  Visited With Patient not available  Referral From Nurse Milinda Cave, RN)   Attempted visit per RN referral, mindful of pt's scheduled surgery and the additional stressor of likely needed to leave her baby at [facility].  Pt not present in room at this time; please page as needed on either campus for further support.  WH:  [phone] MC: [phone]  Thank you! [address] [place], [place] [phone]

## 2013-01-07 NOTE — Progress Notes (Signed)
Pt admitted to [facility] unit 5 [facility] from [facility] hospital. Received report from [place], [place]. Pt arrived via [facility]. Pt is alert and oriented, tearful. Patient made familiar with staff, call bell, and room. Bed in lowest position. Call bell within reach. Full assessment to Epic. Will continue to monitor. Fayne Norrie, RN

## 2013-01-07 NOTE — Progress Notes (Signed)
PPD #2 Sore but ok Afeb, VSS Fundus firm Cre down to 2.65 Stable for transfer to [facility], needs to see me in 6 weeks for postpartum exam.  I will be available if she has any OB issues while at [facility].
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
## 2013-01-07 NOTE — Progress Notes (Signed)
Postpartum day # 2  She had a boy  She is very emotional and frightened which is certainly understandable under the circumstances  She had acute renal failure. Creatinine down to 2.65  Anemic with Hct 24  CXR yesterday showed no change in loculated right pleural effusion  For Right VATS, drainage of effusion and possible decortication in AM  I discussed with her the indications, risks, benefits and alternatives. We discussed the general nature of the procedure, the need for general anesthesia, the incisions to be used, the expected hospital stay and overall recovery. She understands the risks include but are not limited to death, DVT, PE, bleeding, possible need for transfusion, infection, air leak, as well as other unforeseeable complications. She accepts the risks and agrees to proceed.

## 2013-01-07 NOTE — Progress Notes (Addendum)
PULMONARY  / CRITICAL CARE MEDICINE ADMISSION NOTE (H&P)  Name: [name] [name] MRN: [id number] DOB: 09/01/1990    ADMISSION DATE:  12/30/2012 CONSULTATION DATE:  01/07/2013  REFERRING MD :  Tawanna Cooler Meisinger  CHIEF COMPLAINT: Short of breath  BRIEF PATIENT DESCRIPTION:  22 y/o female in 35th week of pregnancy G1P0 had Rt hydronephrosis s/p percutaneous nephrostomy tube.  She had attempted tube exchange on 9/09 >> complicated by contrast extravasation.  Developed progressive dyspnea, Rt pleuritic chest pain, hypoxia, and fever up to 103F.  Presented again to [facility] ER and found to have large rt pleural effusion.  PCCM consulted to assess pleural effusion. Pt transferred to women's 9/20 for labor induction. Pt delivered 9/21. Plan for return to [facility] 9/23 for planned VATS 9/24  SIGNIFICANT EVENTS: 9/09 - unsuccessful percutaneous nephrostomy tube exchange 9/10 - urology consulted, placement of Rt ureteral double J stent 9/11 - oxygen desaturation noted - CXR shows ATX 9/12 - d/c home 9/15 - present to [facility] ED with dyspnea - Large Rt pleural effusion, PCCM consulted 9/16 - unable to perform thora, not enough fluid per IR, TCTS consulted for loculated Rt pleural effusion 9/21- pt transferred to women's for delivery at 35 weeks 9/23- transfer to [facility] for planned VATS 9/24 per PCCM  STUDIES:  9/16 CXR: RLL pleural effusion consistent with pleural effusion most likely pneumonia 9/16 CT chest : mod Rt pleural effusion partially loculated, consolidation with air bronchograms RML and RLL suspect empyema  LINES / TUBES: 10/30/12 Rt nephrostomy tube >> out  CULTURES: 9/15 Blood  : GPC in clusters -1/2 coag neg staff 9/16 MRSA PCR: Neg  ANTIBIOTICS: Clindamycin 9/09 >> 9/09 Rocephin 9/09 >> 9/11 Keflex 9/11 >> 9/15 Vancomycin 9/15 >>9/18  Zosyn 9/15 >>  PAST MEDICAL HISTORY :  Past Medical History   Diagnosis  Date   .  Anxiety    .  Bipolar 1 disorder    .  Post traumatic stress disorder (PTSD)     .  Chronic pain      muscle spasms- entire body   .  Infection      UTI   .  Fracture, ankle      left- x2   .  Depression      doing fine now   .  Hydronephrosis     Past Surgical History   Procedure  Laterality  Date   .  No past surgeries     .  Nephrostomy tube placement at 25wks and removed at 335/7  Right    .  Cystoscopy with stent placement  Right  12/25/2012     Procedure: CYSTOSCOPY WITH STENT PLACEMENT; Surgeon: Marcine Matar, MD; Location: [facility] ORS; Service: Urology; Laterality: Right;    Prior to Admission medications   Medication  Sig  Start Date  End Date  Taking?  Authorizing Provider   cephALEXin (KEFLEX) 500 MG capsule  Take 500 mg by mouth 3 (three) times daily.    Yes  Historical Provider, MD   diphenhydrAMINE (BENADRYL) 25 mg capsule  Take 25 mg by mouth every 6 (six) hours as needed for itching.    Yes  Historical Provider, MD   docusate sodium (COLACE) 100 MG capsule  Take 100 mg by mouth 3 (three) times daily as needed for constipation.    Yes  Historical Provider, MD   ondansetron (ZOFRAN) 8 MG tablet  Take 8 mg by mouth every 8 (eight) hours as needed for nausea.    Yes  Historical Provider, MD   oxybutynin (DITROPAN) 5 MG tablet  Take 5 mg by mouth 3 (three) times daily.    Yes  Historical Provider, MD   oxyCODONE-acetaminophen (PERCOCET) 10-325 MG per tablet  Take 1 tablet by mouth every 4 (four) hours as needed for pain.    Yes  Historical Provider, MD   pantoprazole (PROTONIX) 40 MG tablet  Take 40 mg by mouth daily.    Yes  Historical Provider, MD   ranitidine (ZANTAC) 75 MG tablet  Take 75 mg by mouth daily.    Yes  Historical Provider, MD    Allergies   Allergen  Reactions   .  Cinnamon  Anaphylaxis and Hives   .  Shellfish Allergy  Anaphylaxis   .  Amoxicillin-Pot Clavulanate  Nausea And Vomiting   .  Nickel  Rash    FAMILY HISTORY:  Family History   Problem  Relation  Age of Onset   .  Heart disease  Mother    .  Hypertension  Father    .   Cancer  Paternal Grandmother      lung   .  Heart disease  Paternal Grandmother     SOCIAL HISTORY:  reports that she has been smoking Cigarettes. She has been smoking about 0.25 packs per day. She has never used smokeless tobacco. She reports that drinks alcohol. She reports that she does not use illicit drugs.  REVIEW OF SYSTEMS:  Negative except above.   SUBJECTIVE: S/p vaginal delivery 9/21.    VITAL SIGNS: Temp:  [98.6 F (37 C)-99.1 F (37.3 C)] 99.1 F (37.3 C) (09/23 1210) Pulse Rate:  [71-105] 98 (09/23 1210) Resp:  [16-18] 17 (09/23 1500) BP: (140-155)/(70-96) 155/96 mmHg (09/23 1210) Room air  PHYSICAL EXAMINATION: General: Well appearing, anxious but no acute distress. Neuro:  Alert and interactive, moves all ext to command. HEENT:  Clarita/AT, PERRL, EOM-I and MMM. Cardiovascular:  RRR, Nl S1/S2, -M/R/G. Lungs: Decrease BS on the right with crackles R>L. Abdomen:  Soft, tender at the lower part, ND and +BS Musculoskeletal: -edema and -tenderness. Skin: Intact.  Labs: CBC Recent Labs     01/06/13  0542  01/07/13  0543  WBC  16.6*  16.8*  HGB  7.8*  7.7*  HCT  23.8*  23.6*  PLT  364  388   BMET Recent Labs     01/05/13  0518  01/06/13  0542  01/07/13  0543  NA  135  138  138  K  3.6  3.5  4.4  CL  102  108  107  CO2  20  21  21   BUN  20  21  23   CREATININE  2.98*  2.80*  2.65*  GLUCOSE  88  85  74   Electrolytes Recent Labs     01/05/13  0518  01/06/13  0542  01/07/13  0543  CALCIUM  8.2*  7.9*  8.3*    CXR: 9/22 Loculated right pleural effusion, with increased RML and RLL opacities    ASSESSMENT / PLAN:  PULMONARY: A: Rt sided loculated effusion with PNA -most likely from aspiration P: - VATS planned 9/24 per Dr. Dorris Fetch. - Likely to 2300 post VATS. - Titrate O2 as needed (currently on none).  CARDIAC A: SIRS/sepsis -improved  P: - Monitor hemodynamics - Monitor BMET/ CBC intermittently  RENAL A: Rt hydronephrosis of  pregnancy s/p Rt ureteral stent Acute renal insufficiency 9/18-? From vancomycin-Cr sl worse 9/22.  Currently  improving with adequate UOP. P: - Monitor renal fx, urine outpt, electrolytes daily - Urology following (Dalsthedt) - spoke with urology to f/u again, will address post op. - Monitor Cr in AM (steadily improving). - IVF as ordered.  HEMATOLOGY A:  Leukocytosis - likely related to infection Anemia of pregnancy. P: - F/u CBC 9/23. - SCD for DVT prevention. - Consider transfusion if Hgb <7, anticipate will require transfusion post op.  GASTROENTEROLOGY A: Reflux in pregnancy Constipation P: - Protonix as needed, may be able to stop post op as patient already delivered. - Miralax, Simethicone prn. - Zofran Prn Nausea. - NPO after midnight for VATS on 9/24.  OBSTETRICS A: S/p delivery AM 9/21 P: - Dr. Jackelyn Knife OB following pt  INFECTION A: Aspiration pneumonia GPC in cluster in one of two blood cultures - likely contaminate; low concern for MRSA. P: - Continue Zosyn ( Day #8/x)  - Continue to monitor CBC intermittently - D/C vanc due to renal function, if culture is suspicious for MRSA will add linezolid.  ENDOCRINE A: No acute issues. P: - Monitor CBGs on BMET  NEUROLOGY A: Chronic Pain Depression/Anxiety P: - PRN tylenol, percocet, Fentanyl  - Supportive care.  *Care during the described time interval was provided by me and/or other providers on the critical care team. I have reviewed this patient's available data, including medical history, events of note, physical examination and test results as part of my evaluation.  Alyson Reedy, M.D. [facility] Pulmonary/Critical Care Medicine. Pager: [phone]. After hours pager: [phone].

## 2013-01-08 ENCOUNTER — Encounter (HOSPITAL_COMMUNITY)
Admission: AD | Disposition: A | Discharge status: Home / Self Care | Payer: Self-pay | Source: Ambulatory Visit | Attending: Obstetrics and Gynecology

## 2013-01-08 ENCOUNTER — Encounter (HOSPITAL_COMMUNITY): Payer: Self-pay | Admitting: Anesthesiology

## 2013-01-08 ENCOUNTER — Inpatient Hospital Stay (HOSPITAL_COMMUNITY): Payer: Medicaid Other

## 2013-01-08 ENCOUNTER — Inpatient Hospital Stay (HOSPITAL_COMMUNITY): Payer: Medicaid Other | Admitting: Anesthesiology

## 2013-01-08 DIAGNOSIS — J869 Pyothorax without fistula: Secondary | ICD-10-CM

## 2013-01-08 HISTORY — PX: VIDEO ASSISTED THORACOSCOPY (VATS)/DECORTICATION: SHX6171

## 2013-01-08 LAB — COMPREHENSIVE METABOLIC PANEL
ALT: 7 U/L (ref 0–35)
AST: 10 U/L (ref 0–37)
Albumin: 1.8 g/dL — ABNORMAL LOW (ref 3.5–5.2)
Alkaline Phosphatase: 117 U/L (ref 39–117)
CO2: 19 mEq/L (ref 19–32)
Calcium: 8.1 mg/dL — ABNORMAL LOW (ref 8.4–10.5)
Chloride: 107 mEq/L (ref 96–112)
Creatinine, Ser: 2.59 mg/dL — ABNORMAL HIGH (ref 0.50–1.10)
GFR calc Af Amer: 29 mL/min — ABNORMAL LOW (ref >90)
GFR calc non Af Amer: 25 mL/min — ABNORMAL LOW (ref >90)
Glucose, Bld: 80 mg/dL (ref 70–99)
Total Bilirubin: <0.1 mg/dL — ABNORMAL LOW (ref 0.3–1.2)

## 2013-01-08 LAB — BLOOD GAS, ARTERIAL
Acid-base deficit: 1.8 mmol/L (ref 0.0–2.0)
Drawn by: 252031
Patient temperature: 98.6
pCO2 arterial: 34.1 mmHg — ABNORMAL LOW (ref 35.0–45.0)
pH, Arterial: 7.425 (ref 7.350–7.450)

## 2013-01-08 LAB — URINE MICROSCOPIC-ADD ON

## 2013-01-08 LAB — URINALYSIS, ROUTINE W REFLEX MICROSCOPIC
Bilirubin Urine: NEGATIVE
Glucose, UA: NEGATIVE mg/dL
Protein, ur: NEGATIVE mg/dL
pH: 6.5 (ref 5.0–8.0)

## 2013-01-08 LAB — SURGICAL PCR SCREEN
MRSA, PCR: NEGATIVE
Staphylococcus aureus: NEGATIVE

## 2013-01-08 LAB — PROTIME-INR: Prothrombin Time: 12.9 seconds (ref 11.6–15.2)

## 2013-01-08 SURGERY — VIDEO ASSISTED THORACOSCOPY (VATS)/DECORTICATION
Anesthesia: General | Site: Chest | Laterality: Right | Wound class: Dirty or Infected

## 2013-01-08 MED ORDER — FENTANYL CITRATE 0.05 MG/ML IJ SOLN
INTRAMUSCULAR | Status: AC
  Administered 2013-01-08: 100 ug via INTRAVENOUS
  Filled 2013-01-08: qty 2

## 2013-01-08 MED ORDER — LABETALOL HCL 5 MG/ML IV SOLN
10.0000 mg | Freq: Once | INTRAVENOUS | Status: AC
  Administered 2013-01-08: 10 mg via INTRAVENOUS

## 2013-01-08 MED ORDER — PROPOFOL 10 MG/ML IV BOLUS
INTRAVENOUS | Status: DC | PRN
  Administered 2013-01-08: 160 mg via INTRAVENOUS

## 2013-01-08 MED ORDER — VANCOMYCIN HCL IN DEXTROSE 1-5 GM/200ML-% IV SOLN
1000.0000 mg | INTRAVENOUS | Status: AC
  Administered 2013-01-08: 1000 mg via INTRAVENOUS
  Filled 2013-01-08: qty 200

## 2013-01-08 MED ORDER — SODIUM CHLORIDE 0.45 % IV SOLN: INTRAVENOUS | Status: DC

## 2013-01-08 MED ORDER — ONDANSETRON HCL 4 MG/2ML IJ SOLN
INTRAMUSCULAR | Status: DC | PRN
  Administered 2013-01-08: 4 mg via INTRAVENOUS

## 2013-01-08 MED ORDER — LACTATED RINGERS IV SOLN
INTRAVENOUS | Status: DC | PRN
  Administered 2013-01-08: 12:00:00 via INTRAVENOUS

## 2013-01-08 MED ORDER — LABETALOL HCL 5 MG/ML IV SOLN
INTRAVENOUS | Status: AC
  Filled 2013-01-08: qty 4

## 2013-01-08 MED ORDER — MIDAZOLAM HCL 5 MG/5ML IJ SOLN
INTRAMUSCULAR | Status: DC | PRN
  Administered 2013-01-08: 2 mg via INTRAVENOUS

## 2013-01-08 MED ORDER — DIPHENHYDRAMINE HCL 50 MG/ML IJ SOLN
12.5000 mg | Freq: Four times a day (QID) | INTRAMUSCULAR | Status: DC | PRN
  Filled 2013-01-08: qty 0.25

## 2013-01-08 MED ORDER — NALOXONE HCL 0.4 MG/ML IJ SOLN
0.4000 mg | INTRAMUSCULAR | Status: DC | PRN
  Filled 2013-01-08: qty 1

## 2013-01-08 MED ORDER — SODIUM CHLORIDE 0.9 % IJ SOLN: 9.0000 mL | INTRAMUSCULAR | Status: DC | PRN

## 2013-01-08 MED ORDER — VANCOMYCIN HCL IN DEXTROSE 1-5 GM/200ML-% IV SOLN
1000.0000 mg | Freq: Two times a day (BID) | INTRAVENOUS | Status: AC
  Administered 2013-01-09: 1000 mg via INTRAVENOUS
  Filled 2013-01-08: qty 200

## 2013-01-08 MED ORDER — OXYCODONE-ACETAMINOPHEN 5-325 MG PO TABS
1.0000 | ORAL_TABLET | ORAL | Status: DC | PRN
  Filled 2013-01-08 (×2): qty 2

## 2013-01-08 MED ORDER — LABETALOL HCL 5 MG/ML IV SOLN
INTRAVENOUS | Status: DC | PRN
  Administered 2013-01-08: 10 mg via INTRAVENOUS

## 2013-01-08 MED ORDER — SODIUM CHLORIDE 0.9 % IV SOLN
10.0000 mg | INTRAVENOUS | Status: DC | PRN
  Administered 2013-01-08: 40 ug/min via INTRAVENOUS

## 2013-01-08 MED ORDER — DIPHENHYDRAMINE HCL 12.5 MG/5ML PO ELIX
12.5000 mg | ORAL_SOLUTION | Freq: Four times a day (QID) | ORAL | Status: DC | PRN
  Filled 2013-01-08: qty 5

## 2013-01-08 MED ORDER — ONDANSETRON HCL 4 MG/2ML IJ SOLN: 4.0000 mg | Freq: Four times a day (QID) | INTRAMUSCULAR | Status: DC | PRN

## 2013-01-08 MED ORDER — SENNOSIDES-DOCUSATE SODIUM 8.6-50 MG PO TABS
1.0000 | ORAL_TABLET | Freq: Every evening | ORAL | Status: DC | PRN
  Filled 2013-01-08: qty 1

## 2013-01-08 MED ORDER — GLYCOPYRROLATE 0.2 MG/ML IJ SOLN
INTRAMUSCULAR | Status: DC | PRN
  Administered 2013-01-08: 0.6 mg via INTRAVENOUS

## 2013-01-08 MED ORDER — TRAMADOL HCL 50 MG PO TABS: 50.0000 mg | ORAL_TABLET | Freq: Four times a day (QID) | ORAL | Status: DC | PRN

## 2013-01-08 MED ORDER — FENTANYL CITRATE 0.05 MG/ML IJ SOLN
INTRAMUSCULAR | Status: DC | PRN
  Administered 2013-01-08 (×4): 50 ug via INTRAVENOUS
  Administered 2013-01-08: 100 ug via INTRAVENOUS
  Administered 2013-01-08: 50 ug via INTRAVENOUS
  Administered 2013-01-08: 150 ug via INTRAVENOUS
  Administered 2013-01-08 (×3): 50 ug via INTRAVENOUS
  Administered 2013-01-08: 100 ug via INTRAVENOUS

## 2013-01-08 MED ORDER — NEOSTIGMINE METHYLSULFATE 1 MG/ML IJ SOLN
INTRAMUSCULAR | Status: DC | PRN
  Administered 2013-01-08: 3 mg via INTRAVENOUS

## 2013-01-08 MED ORDER — FENTANYL CITRATE 0.05 MG/ML IJ SOLN
50.0000 ug | INTRAMUSCULAR | Status: DC | PRN
  Administered 2013-01-08: 100 ug via INTRAVENOUS

## 2013-01-08 MED ORDER — ROCURONIUM BROMIDE 100 MG/10ML IV SOLN
INTRAVENOUS | Status: DC | PRN
  Administered 2013-01-08: 50 mg via INTRAVENOUS
  Administered 2013-01-08 (×2): 10 mg via INTRAVENOUS

## 2013-01-08 MED ORDER — ACETAMINOPHEN 500 MG PO TABS
1000.0000 mg | ORAL_TABLET | Freq: Four times a day (QID) | ORAL | Status: AC
  Administered 2013-01-09 (×2): 1000 mg via ORAL
  Filled 2013-01-08 (×2): qty 2

## 2013-01-08 MED ORDER — METOPROLOL TARTRATE 1 MG/ML IV SOLN
5.0000 mg | INTRAVENOUS | Status: DC
  Administered 2013-01-08 – 2013-01-11 (×17): 5 mg via INTRAVENOUS
  Filled 2013-01-08 (×20): qty 5

## 2013-01-08 MED ORDER — BISACODYL 5 MG PO TBEC
10.0000 mg | DELAYED_RELEASE_TABLET | Freq: Every day | ORAL | Status: DC
  Administered 2013-01-09 – 2013-01-11 (×3): 10 mg via ORAL
  Filled 2013-01-08 (×3): qty 2

## 2013-01-08 MED ORDER — ACETAMINOPHEN 160 MG/5ML PO SOLN: 1000.0000 mg | Freq: Four times a day (QID) | ORAL | Status: AC

## 2013-01-08 MED ORDER — ONDANSETRON HCL 4 MG/2ML IJ SOLN
4.0000 mg | Freq: Four times a day (QID) | INTRAMUSCULAR | Status: DC | PRN
Start: 2013-01-08 — End: 2013-01-08

## 2013-01-08 MED ORDER — DEXTROSE-NACL 5-0.9 % IV SOLN
INTRAVENOUS | Status: DC
  Administered 2013-01-08: 21:00:00 via INTRAVENOUS
  Administered 2013-01-09: 50 mL/h via INTRAVENOUS
  Administered 2013-01-09 – 2013-01-11 (×3): via INTRAVENOUS

## 2013-01-08 MED ORDER — HYDRALAZINE HCL 20 MG/ML IJ SOLN
INTRAMUSCULAR | Status: AC
  Filled 2013-01-08: qty 1

## 2013-01-08 MED ORDER — OXYCODONE-ACETAMINOPHEN 5-325 MG PO TABS
1.0000 | ORAL_TABLET | ORAL | Status: DC | PRN
  Administered 2013-01-08 – 2013-01-09 (×5): 2 via ORAL
  Filled 2013-01-08 (×4): qty 2

## 2013-01-08 MED ORDER — HYDRALAZINE HCL 20 MG/ML IJ SOLN
2.0000 mg | INTRAMUSCULAR | Status: DC | PRN
  Administered 2013-01-08: 2 mg via INTRAVENOUS
  Administered 2013-01-08: 3 mg via INTRAVENOUS
  Administered 2013-01-08: 2 mg via INTRAVENOUS

## 2013-01-08 MED ORDER — ONDANSETRON HCL 4 MG/2ML IJ SOLN: 4.0000 mg | Freq: Once | INTRAMUSCULAR | Status: DC | PRN

## 2013-01-08 MED ORDER — OXYCODONE HCL 5 MG PO TABS
5.0000 mg | ORAL_TABLET | ORAL | Status: AC | PRN
  Administered 2013-01-08 – 2013-01-09 (×3): 10 mg via ORAL
  Filled 2013-01-08 (×3): qty 2

## 2013-01-08 MED ORDER — POTASSIUM CHLORIDE 10 MEQ/50ML IV SOLN: 10.0000 meq | Freq: Every day | INTRAVENOUS | Status: DC | PRN

## 2013-01-08 MED ORDER — 0.9 % SODIUM CHLORIDE (POUR BTL) OPTIME
TOPICAL | Status: DC | PRN
  Administered 2013-01-08: 2000 mL

## 2013-01-08 MED ORDER — HYDROMORPHONE HCL PF 1 MG/ML IJ SOLN
0.2500 mg | INTRAMUSCULAR | Status: DC | PRN
  Administered 2013-01-08 (×4): 0.5 mg via INTRAVENOUS

## 2013-01-08 MED ORDER — HYDROMORPHONE HCL PF 1 MG/ML IJ SOLN
INTRAMUSCULAR | Status: AC
  Filled 2013-01-08: qty 1

## 2013-01-08 MED ORDER — FENTANYL 10 MCG/ML IV SOLN
INTRAVENOUS | Status: DC
  Administered 2013-01-08: 200 ug via INTRAVENOUS
  Administered 2013-01-08: 18:00:00 via INTRAVENOUS
  Administered 2013-01-09: 270 ug via INTRAVENOUS
  Administered 2013-01-09: 255 ug via INTRAVENOUS
  Administered 2013-01-09 (×2): via INTRAVENOUS
  Filled 2013-01-08 (×4): qty 50

## 2013-01-08 SURGICAL SUPPLY — 72 items
ADH SKN CLS APL DERMABOND .7 (GAUZE/BANDAGES/DRESSINGS) ×1
APL SRG 22X2 LUM MLBL SLNT (VASCULAR PRODUCTS)
APPLICATOR TIP EXT COSEAL (VASCULAR PRODUCTS) IMPLANT
BAG SPEC RTRVL LRG 6X4 10 (ENDOMECHANICALS)
BLADE SURG 11 STRL SS (BLADE) ×2 IMPLANT
CANISTER SUCTION 2500CC (MISCELLANEOUS) ×2 IMPLANT
CATH KIT ON Q 5IN SLV (PAIN MANAGEMENT) IMPLANT
CATH THORACIC 28FR (CATHETERS) IMPLANT
CATH THORACIC 28FR RT ANG (CATHETERS) IMPLANT
CATH THORACIC 36FR (CATHETERS) IMPLANT
CATH THORACIC 36FR RT ANG (CATHETERS) IMPLANT
CLEANER TIP ELECTROSURG 2X2 (MISCELLANEOUS) ×2 IMPLANT
CLIP TI MEDIUM 6 (CLIP) ×2 IMPLANT
CONN ST 1/4X3/8  BEN (MISCELLANEOUS) ×2
CONN ST 1/4X3/8 BEN (MISCELLANEOUS) IMPLANT
CONN Y 3/8X3/8X3/8  BEN (MISCELLANEOUS)
CONN Y 3/8X3/8X3/8 BEN (MISCELLANEOUS) IMPLANT
CONT SPEC 4OZ CLIKSEAL STRL BL (MISCELLANEOUS) ×6 IMPLANT
COVER SURGICAL LIGHT HANDLE (MISCELLANEOUS) ×4 IMPLANT
DERMABOND ADVANCED (GAUZE/BANDAGES/DRESSINGS) ×1
DERMABOND ADVANCED .7 DNX12 (GAUZE/BANDAGES/DRESSINGS) IMPLANT
DRAIN CHANNEL 32F RND 10.7 FF (WOUND CARE) ×2 IMPLANT
DRAPE LAPAROSCOPIC ABDOMINAL (DRAPES) ×2 IMPLANT
DRAPE WARM FLUID 44X44 (DRAPE) ×4 IMPLANT
ELECT REM PT RETURN 9FT ADLT (ELECTROSURGICAL) ×2
ELECTRODE REM PT RTRN 9FT ADLT (ELECTROSURGICAL) ×1 IMPLANT
GLOVE BIOGEL PI IND STRL 7.0 (GLOVE) IMPLANT
GLOVE BIOGEL PI INDICATOR 7.0 (GLOVE) ×1
GLOVE SURG SIGNA 7.5 PF LTX (GLOVE) ×4 IMPLANT
GLOVE SURG SS PI 7.0 STRL IVOR (GLOVE) ×1 IMPLANT
GOWN PREVENTION PLUS XLARGE (GOWN DISPOSABLE) ×3 IMPLANT
GOWN STRL NON-REIN LRG LVL3 (GOWN DISPOSABLE) ×4 IMPLANT
HANDLE STAPLE ENDO GIA SHORT (STAPLE)
HEMOSTAT SURGICEL 2X14 (HEMOSTASIS) IMPLANT
KIT BASIN OR (CUSTOM PROCEDURE TRAY) ×2 IMPLANT
KIT ROOM TURNOVER OR (KITS) ×2 IMPLANT
KIT SUCTION CATH 14FR (SUCTIONS) ×1 IMPLANT
NS IRRIG 1000ML POUR BTL (IV SOLUTION) ×4 IMPLANT
PACK CHEST (CUSTOM PROCEDURE TRAY) ×2 IMPLANT
PAD ARMBOARD 7.5X6 YLW CONV (MISCELLANEOUS) ×4 IMPLANT
POUCH ENDO CATCH II 15MM (MISCELLANEOUS) IMPLANT
POUCH SPECIMEN RETRIEVAL 10MM (ENDOMECHANICALS) IMPLANT
SEALANT PROGEL (MISCELLANEOUS) IMPLANT
SEALANT SURG COSEAL 4ML (VASCULAR PRODUCTS) IMPLANT
SEALANT SURG COSEAL 8ML (VASCULAR PRODUCTS) IMPLANT
SOLUTION ANTI FOG 6CC (MISCELLANEOUS) ×4 IMPLANT
SPONGE GAUZE 4X4 12PLY (GAUZE/BANDAGES/DRESSINGS) ×3 IMPLANT
SPONGE INTESTINAL PEANUT (DISPOSABLE) ×1 IMPLANT
STAPLER ENDO GIA 12 SHRT THIN (STAPLE) IMPLANT
STAPLER ENDO GIA 12MM SHORT (STAPLE) IMPLANT
SUT PROLENE 4 0 RB 1 (SUTURE)
SUT PROLENE 4-0 RB1 .5 CRCL 36 (SUTURE) IMPLANT
SUT SILK  1 MH (SUTURE) ×2
SUT SILK 1 MH (SUTURE) ×2 IMPLANT
SUT SILK 2 0SH CR/8 30 (SUTURE) IMPLANT
SUT SILK 3 0SH CR/8 30 (SUTURE) IMPLANT
SUT VIC AB 1 CTX 36 (SUTURE) ×2
SUT VIC AB 1 CTX36XBRD ANBCTR (SUTURE) IMPLANT
SUT VIC AB 2-0 CTX 36 (SUTURE) ×1 IMPLANT
SUT VIC AB 3-0 MH 27 (SUTURE) IMPLANT
SUT VIC AB 3-0 X1 27 (SUTURE) ×1 IMPLANT
SUT VICRYL 2 TP 1 (SUTURE) IMPLANT
SYSTEM SAHARA CHEST DRAIN RE-I (WOUND CARE) ×2 IMPLANT
TAPE CLOTH 4X10 WHT NS (GAUZE/BANDAGES/DRESSINGS) ×2 IMPLANT
TAPE CLOTH SURG 4X10 WHT LF (GAUZE/BANDAGES/DRESSINGS) ×1 IMPLANT
TIP APPLICATOR SPRAY EXTEND 16 (VASCULAR PRODUCTS) IMPLANT
TOWEL OR 17X24 6PK STRL BLUE (TOWEL DISPOSABLE) ×4 IMPLANT
TOWEL OR 17X26 10 PK STRL BLUE (TOWEL DISPOSABLE) ×4 IMPLANT
TRAY FOLEY CATH 14FRSI W/METER (CATHETERS) ×2 IMPLANT
TUNNELER SHEATH ON-Q 11GX8 DSP (PAIN MANAGEMENT) IMPLANT
WATER STERILE IRR 1000ML POUR (IV SOLUTION) ×4 IMPLANT
YANKAUER SUCT BULB TIP NO VENT (SUCTIONS) ×1 IMPLANT

## 2013-01-08 NOTE — Brief Op Note (Addendum)
      [address]       [place] [address]             [phone]     12/30/2012 - 01/08/2013  4:04 PM  PATIENT:  [name] [name]  22 y.o. female  PRE-OPERATIVE DIAGNOSIS:  right pleural effusion/empyema  POST-OPERATIVE DIAGNOSIS:  Same  PROCEDURE:  Procedure(s): VIDEO ASSISTED THORACOSCOPY (VATS)/DECORTICATION  SURGEON:  Surgeon(s): Loreli Slot, MD  PHYSICIAN ASSISTANT: Gershon Crane PA-C  ANESTHESIA:   general  SPECIMEN:  Source of Specimen:  right pleural effusion/exudate for culture  DISPOSITION OF SPECIMEN:  Pathology  DRAINS: 2 Chest Tube(s) in the right hemithorax   PATIENT CONDITION:  PACU - hemodynamically stable.  PRE-OPERATIVE WEIGHT: 98kg  Complications: no known  FINDINGS: Early organizing empyema, good reexpansion of lung post decortication

## 2013-01-08 NOTE — Progress Notes (Signed)
Report given to Brett Canales on 2300.

## 2013-01-08 NOTE — Transfer of Care (Signed)
Immediate Anesthesia Transfer of Care Note  Patient: [name] [name]  Procedure(s) Performed: Procedure(s): VIDEO ASSISTED THORACOSCOPY DECORTICATION; Drainage of Pleural Effusion (Right)  Patient Location: PACU  Anesthesia Type:General  Level of Consciousness: awake, alert  and oriented  Airway & Oxygen Therapy: Patient Spontanous Breathing and Patient connected to face mask oxygen  Post-op Assessment: Report given to PACU RN and Post -op Vital signs reviewed and stable  Post vital signs: Reviewed and stable  Complications: No apparent anesthesia complications

## 2013-01-08 NOTE — H&P (View-Only) (Signed)
Reason for Consult:Right pleural effusion Referring Physician: Dr. Venida [name] is an 22 y.o. female.  HPI:  [name] [name] is a 22 y/o female who is [redacted] weeks pregnant. During her pregnancy she developed a right hydronephrosis treated with a percutaneous nephrostomy tube. She had an attempted tube exchange on 9/9, this could not be completed and during the procedure there was contrast extravasation into the right pleura. She went home on 9/12. But over the next 3 days she developed progressive dyspnea, pleuritic chest pain, and fever up to 103F. She presented to the [facility] ER and a CXR showed a  large right pleural effusion. An attempt was made to drain this percutaneously, but no fluid could be withdrawn. She says her shortness of breath has resolved and the pain has improved since admission.  A CT of the chest was done today which showed a loculated right pleural effusion with compressive atelectasis v pneumonia in the right lung base. She is currently on Vancomycin and zosyn.   Past Medical History  Diagnosis Date  . Anxiety   . Chronic pain     muscle spasms- entire body  . Infection     UTI  . Fracture, ankle     left- x2  . Hydronephrosis   . Bipolar 1 disorder   . Post traumatic stress disorder (PTSD)   . Depression     doing fine now  . OCD (obsessive compulsive disorder)     Past Surgical History  Procedure Laterality Date  . No past surgeries    . Nephrostomy tube placement at 25wks and removed at 335/7 Right   . Cystoscopy with stent placement Right 12/25/2012    Procedure: CYSTOSCOPY WITH STENT PLACEMENT;  Surgeon: Marcine Matar, MD;  Location: [facility] ORS;  Service: Urology;  Laterality: Right;    Family History  Problem Relation Age of Onset  . Heart disease Mother   . Hypertension Father   . Cancer Paternal Grandmother     lung  . Heart disease Paternal Grandmother     Social History:  reports that she has been smoking Cigarettes.  She has been smoking about 0.25  packs per day. She has never used smokeless tobacco. She reports that  drinks alcohol. She reports that she does not use illicit drugs.  Allergies:  Allergies  Allergen Reactions  . Cinnamon Anaphylaxis and Hives  . Shellfish Allergy Anaphylaxis  . Amoxicillin-Pot Clavulanate Nausea And Vomiting  . Nickel Rash    Medications:  Prior to Admission:  Prescriptions prior to admission  Medication Sig Dispense Refill  . cephALEXin (KEFLEX) 500 MG capsule Take 500 mg by mouth 3 (three) times daily.      . diphenhydrAMINE (BENADRYL) 25 mg capsule Take 25 mg by mouth every 6 (six) hours as needed for itching.      . docusate sodium (COLACE) 100 MG capsule Take 100 mg by mouth 3 (three) times daily as needed for constipation.      . ondansetron (ZOFRAN) 8 MG tablet Take 8 mg by mouth every 8 (eight) hours as needed for nausea.      Marland Kitchen oxybutynin (DITROPAN) 5 MG tablet Take 5 mg by mouth 3 (three) times daily.      Marland Kitchen oxyCODONE-acetaminophen (PERCOCET) 10-325 MG per tablet Take 1 tablet by mouth every 4 (four) hours as needed for pain.      . pantoprazole (PROTONIX) 40 MG tablet Take 40 mg by mouth daily.      . ranitidine (  ZANTAC) 75 MG tablet Take 75 mg by mouth daily.        Results for orders placed during the hospital encounter of 12/30/12 (from the past 48 hour(s))  CBC     Status: Abnormal   Collection Time    12/30/12  5:40 PM      Result Value Range   WBC 20.5 (*) 4.0 - 10.5 K/uL   RBC 4.12  3.87 - 5.11 MIL/uL   Hemoglobin 10.2 (*) 12.0 - 15.0 g/dL   HCT 16.1 (*) 09.6 - 04.5 %   MCV 74.0 (*) 78.0 - 100.0 fL   MCH 24.8 (*) 26.0 - 34.0 pg   MCHC 33.4  30.0 - 36.0 g/dL   RDW 40.9  81.1 - 91.4 %   Platelets 341  150 - 400 K/uL  CULTURE, BLOOD (ROUTINE X 2)     Status: None   Collection Time    12/30/12  5:40 PM      Result Value Range   Specimen Description BLOOD LEFT ARM     Special Requests       Value: BOTTLES DRAWN AEROBIC AND ANAEROBIC 6CC EACH BOTTLE   Culture  Setup Time        Value: 12/30/2012 22:28     Performed at [facility]   Culture       Value:        BLOOD CULTURE RECEIVED NO GROWTH TO DATE CULTURE WILL BE HELD FOR 5 DAYS BEFORE ISSUING A FINAL NEGATIVE REPORT     Performed at [facility]   Report Status PENDING    CULTURE, BLOOD (ROUTINE X 2)     Status: None   Collection Time    12/30/12  5:50 PM      Result Value Range   Specimen Description BLOOD RIGHT ARM     Special Requests BOTTLES DRAWN AEROBIC ONLY 3CC     Culture  Setup Time       Value: 12/30/2012 22:28     Performed at [facility]   Culture       Value:        BLOOD CULTURE RECEIVED NO GROWTH TO DATE CULTURE WILL BE HELD FOR 5 DAYS BEFORE ISSUING A FINAL NEGATIVE REPORT     Performed at [facility]   Report Status PENDING    PROCALCITONIN     Status: None   Collection Time    12/30/12  6:15 PM      Result Value Range   Procalcitonin 0.11     Comment:            Interpretation:     PCT (Procalcitonin) <= 0.5 ng/mL:     Systemic infection (sepsis) is not likely.     Local bacterial infection is possible.     (NOTE)             ICU PCT Algorithm               Non ICU PCT Algorithm        ----------------------------     ------------------------------             PCT < 0.25 ng/mL                 PCT < 0.1 ng/mL         Stopping of antibiotics            Stopping of antibiotics  strongly encouraged.               strongly encouraged.        ----------------------------     ------------------------------           PCT level decrease by               PCT < 0.25 ng/mL           >= 80% from peak PCT           OR PCT 0.25 - 0.5 ng/mL          Stopping of antibiotics                                                 encouraged.         Stopping of antibiotics               encouraged.        ----------------------------     ------------------------------           PCT level decrease by              PCT >= 0.25 ng/mL           < 80% from  peak PCT            AND PCT >= 0.5 ng/mL            Continuing antibiotics                                                  encouraged.           Continuing antibiotics                encouraged.        ----------------------------     ------------------------------         PCT level increase compared          PCT > 0.5 ng/mL             with peak PCT AND              PCT >= 0.5 ng/mL             Escalation of antibiotics                                              strongly encouraged.          Escalation of antibiotics            strongly encouraged.     Performed at [facility]  LACTIC ACID, PLASMA     Status: None   Collection Time    12/30/12  6:15 PM      Result Value Range   Lactic Acid, Venous 1.1  0.5 - 2.2 mmol/L   Comment: Performed at [facility]  MRSA PCR SCREENING     Status: None   Collection Time    12/30/12  9:21 PM      Result Value Range   MRSA by PCR NEGATIVE  NEGATIVE  Comment:            The GeneXpert MRSA Assay (FDA     approved for NASAL specimens     only), is one component of a     comprehensive MRSA colonization     surveillance program. It is not     intended to diagnose MRSA     infection nor to guide or     monitor treatment for     MRSA infections.    Dg Chest 1 View  12/31/2012   CLINICAL DATA:  Post thoracentesis.  EXAM: CHEST - 1 VIEW  COMPARISON:  12/30/2012  FINDINGS: Large right pleural effusion persists, not significantly changed. No visible pneumothorax. Right lower lobe atelectasis or consolidation. Cardiomegaly. No confluent opacity on the left.  IMPRESSION: Continued large right pleural effusion. No pneumothorax following thoracentesis.   Electronically Signed   By: Charlett Nose M.D.   On: 12/31/2012 11:35   Dg Chest 2 View  12/30/2012   *RADIOLOGY REPORT*  Clinical Data: Chest pain  CHEST - 2 VIEW  Comparison: 12/26/2012  Findings: Increase in size of large right pleural effusion.  There is progressive consolidation /  atelectasis in the right   mid and lower lung.  Left lung clear.  Heart size stable.  Regional bones unremarkable.  IMPRESSION:  Enlarging large right pleural effusion and associated right lung atelectasis/consolidation.   Original Report Authenticated By: D. Andria Rhein, MD   Ct Chest Wo Contrast  12/31/2012   CLINICAL DATA:  Thirty-four weeks pregnant status post recent attempted right thoracentesis. Evaluate pleural effusion and airspace opacity.  EXAM: CT CHEST WITHOUT CONTRAST  TECHNIQUE: Multidetector CT imaging of the chest was performed following the standard protocol without IV contrast.  COMPARISON:  Abdominal pelvic CT 10/23/2012. Chest radiographs 12/30/2012 and 12/31/2012.  FINDINGS: A moderate size residual right pleural effusion appears partially loculated posteriorly and laterally. There is no pneumothorax. There is no significant left pleural effusion or pericardial effusion. There is subtotal opacification of the right middle and lower lobes with associated air bronchograms. There is peripheral atelectasis in the right upper lobe adjacent to the pleural effusion. There is mild left lower lobe atelectasis. No endobronchial lesion or dominant mass is apparent on non contrast imaging.  There is elevation of the right hemidiaphragm with resulting mild mediastinal shift to the left. No enlarged mediastinal or hilar lymph nodes are identified.  The visualized upper abdomen appears unremarkable. There are no worrisome osseous findings.  IMPRESSION: 1. Residual right pleural effusion is partially loculated posteriorly and laterally status post recent attempted thoracentesis. No pneumothorax.  2. Underlying nonspecific right lung airspace opacities with air bronchograms in the right middle and lower lobes. No lung mass is visualized on non contrast imaging.   Electronically Signed   By: Roxy Horseman   On: 12/31/2012 15:46   US Thoracentesis Asp Pleural Space W/img Guide  12/31/2012   CLINICAL DATA:   Right-sided pleural effusion.  EXAM: ULTRASOUND GUIDED right THORACENTESIS  COMPARISON:  None.  FINDINGS: A total of approximately 0cc of fluid was removed.  IMPRESSION: Attempted ultrasound guided thoracentesis yielding no pleural fluid.  Read By: Pattricia Boss PA-C  PROCEDURE: An ultrasound guided thoracentesis was thoroughly discussed with the patient and questions answered. The benefits, risks, alternatives and complications were also discussed. The patient understands and wishes to proceed with the procedure. Written consent was obtained.  Ultrasound was performed to localize and mark an adequate pocket of fluid in the right chest. The  area was then prepped and draped in the normal sterile fashion. 1% Lidocaine was used for local anesthesia. Under ultrasound guidance a 19 gauge Yueh catheter was introduced. Thoracentesis was performed. The catheter was removed and a dressing applied.  Complications: Fluid aspiration attempted however no fluid was obtained secondary to loculated fluid collection.   Electronically Signed   By: Maryclare Bean M.D.   On: 12/31/2012 11:47    Review of Systems  Constitutional: Positive for fever, chills, malaise/fatigue and diaphoresis.       [redacted] weeks pregnant  Respiratory: Positive for cough and shortness of breath. Negative for hemoptysis.   Cardiovascular: Positive for chest pain (right side pleuritic) and leg swelling.  Psychiatric/Behavioral:       Bipolar, OCD   Blood pressure 145/91, pulse 95, temperature 98.5 F (36.9 C), temperature source Oral, resp. rate 22, height 5\' 2"  (1.575 m), weight 202 lb 9.6 oz (91.9 kg), last menstrual period 05/13/2012, SpO2 92.00%. Physical Exam  Vitals reviewed. Constitutional: She is oriented to person, place, and time. No distress.  Pregnant 22 yo woman in NAD  HENT:  Head: Normocephalic and atraumatic.  Eyes: EOM are normal. Pupils are equal, round, and reactive to light.  Neck: No thyromegaly present.  Cardiovascular:  Regular rhythm and normal heart sounds.   Tachycardic, regular  Respiratory:  Absent BS right base  GI:  pregnant  Musculoskeletal: She exhibits edema.  Lymphadenopathy:    She has no cervical adenopathy.  Neurological: She is alert and oriented to person, place, and time. No cranial nerve deficit.  Skin: Skin is warm and dry.   CT CHEST 12/31/2012   CLINICAL DATA:  Thirty-four weeks pregnant status post recent attempted right thoracentesis. Evaluate pleural effusion and airspace opacity.  EXAM: CT CHEST WITHOUT CONTRAST  TECHNIQUE: Multidetector CT imaging of the chest was performed following the standard protocol without IV contrast.  COMPARISON:  Abdominal pelvic CT 10/23/2012. Chest radiographs 12/30/2012 and 12/31/2012.  FINDINGS: A moderate size residual right pleural effusion appears partially loculated posteriorly and laterally. There is no pneumothorax. There is no significant left pleural effusion or pericardial effusion. There is subtotal opacification of the right middle and lower lobes with associated air bronchograms. There is peripheral atelectasis in the right upper lobe adjacent to the pleural effusion. There is mild left lower lobe atelectasis. No endobronchial lesion or dominant mass is apparent on non contrast imaging.  There is elevation of the right hemidiaphragm with resulting mild mediastinal shift to the left. No enlarged mediastinal or hilar lymph nodes are identified.  The visualized upper abdomen appears unremarkable. There are no worrisome osseous findings.  IMPRESSION: 1. Residual right pleural effusion is partially loculated posteriorly and laterally status post recent attempted thoracentesis. No pneumothorax.  2. Underlying nonspecific right lung airspace opacities with air bronchograms in the right middle and lower lobes. No lung mass is visualized on non contrast imaging.   Electronically Signed   By: Roxy Horseman   On: 12/31/2012 15:46   Assessment/Plan: 22 yo WF at  35 weeks with a complex, loculated right pleural effusion. US guided thoracentesis attempt was unsuccessful. Best option from pulmonary standpoint is a right VATS, drainage of effusion and decortication. I don't think additional attempts at percutaneous drainage are likely to be successful. Unfortunately she is pregnant which raises obvious concerns although with the fetus at 35 weeks it should be about as safe as it is going to get. The other option would be to delay intervention on the effusion  until after she delivers, but she is miserable currently and would be a risk for this developing into an empyema or developing a trapped lung.  I would favor proceeding with VATS if OB and anesthesia are comfortable with that decision.   I discussed the general nature of the procedure with her, including the incisions to be used, need for general anesthesia and postoperative recovery. We briefly discussed risks to her and her baby, but will go into more detail if we get the go ahead from [facility]. We could do the procedure on Friday if she wishes to proceed.  Makoto Sellitto C 12/31/2012, 7:09 PM

## 2013-01-08 NOTE — OR Nursing (Signed)
  Right VATS Decortication and Drainage of pleural effusion

## 2013-01-08 NOTE — Progress Notes (Signed)
01/08/13 0900  Clinical Encounter Type  Visited With Patient not available  Consult/Referral To Chaplain  Stress Factors  Patient Stress Factors Major life changes;Health changes;Loss of control (newly postpartum; separated from baby for surgery/recovery)   Referred to [facility] chaplains for spiritual and emotional support for patient and family.  Please also page as needed; [facility] chaplain pager is [phone].  Thank you!  [address] [place], [place] [phone]

## 2013-01-08 NOTE — Progress Notes (Signed)
Chaplain received referral from Marvia Pickles to provide ministry of presence to the patient. Chaplain informed by relative that the patient was sleeping. Chaplain referred consult to Rodney Langton for follow up.   01/08/13 0915  Clinical Encounter Type  Visited With Family;Patient not available  Visit Type Follow-up  Referral From Chaplain

## 2013-01-08 NOTE — Anesthesia Procedure Notes (Signed)
Procedure Name: Intubation Date/Time: 01/08/2013 1:22 PM Performed by: Gwenyth Allegra Pre-anesthesia Checklist: Emergency Drugs available, Patient identified, Timeout performed, Suction available and Patient being monitored Patient Re-evaluated:Patient Re-evaluated prior to inductionOxygen Delivery Method: Circle system utilized Preoxygenation: Pre-oxygenation with 100% oxygen Intubation Type: Inhalational induction with existing ETT Ventilation: Mask ventilation without difficulty and Oral airway inserted - appropriate to patient size Laryngoscope Size: Mac and 3 Grade View: Grade I Endobronchial tube: Left and 37 Fr Airway Equipment and Method: Stylet Tube secured with: Tape Dental Injury: Teeth and Oropharynx as per pre-operative assessment

## 2013-01-08 NOTE — Interval H&P Note (Signed)
History and Physical Interval Note:  01/08/2013 12:53 PM  [name]  has presented today for surgery, with the diagnosis of right pleural effusion  The various methods of treatment have been discussed with the patient and family. After consideration of risks, benefits and other options for treatment, the patient has consented to  Procedure(s): VIDEO ASSISTED THORACOSCOPY (VATS)/DECORTICATION (Right) as a surgical intervention .  The patient's history has been reviewed, patient examined, no change in status, stable for surgery.  I have reviewed the patient's chart and labs.  Questions were answered to the patient's satisfaction.     Chaya Dehaan C

## 2013-01-08 NOTE — Anesthesia Postprocedure Evaluation (Signed)
Anesthesia Post Note  Patient: [name] [name]  Procedure(s) Performed: Procedure(s) (LRB): VIDEO ASSISTED THORACOSCOPY DECORTICATION; Drainage of Pleural Effusion (Right)  Anesthesia type: General  Patient location: PACU  Post pain: Pain level controlled and Adequate analgesia  Post assessment: Post-op Vital signs reviewed, Patient's Cardiovascular Status Stable, Respiratory Function Stable, Patent Airway and Pain level controlled  Last Vitals:  Filed Vitals:   01/08/13 1817  BP: 162/101  Pulse:   Temp:   Resp:     Post vital signs: Reviewed and stable  Level of consciousness: awake, alert  and oriented  Complications: No apparent anesthesia complications

## 2013-01-08 NOTE — Preoperative (Signed)
Beta Blockers   Reason not to administer Beta Blockers:Not Applicable 

## 2013-01-08 NOTE — Progress Notes (Signed)
PULMONARY  / CRITICAL CARE MEDICINE  Name: [name] [name] MRN: [id number] DOB: [birth year]    ADMISSION DATE:  12/30/2012 CONSULTATION DATE:  12/30/2012  REFERRING MD :  Tawanna Cooler Meisinger  CHIEF COMPLAINT: Short of breath  BRIEF PATIENT DESCRIPTION:  22 y/o female in 34th week of pregnancy had Rt hydronephrosis s/p percutaneous nephrostomy tube.  She had attempted tube exchange on 9/09 >> complicated by contrast extravasation.  Developed progressive dyspnea, Rt pleuritic chest pain, hypoxia, and fever up to 103F.  Presented again to [facility] ER and found to have large rt pleural effusion.  PCCM consulted to assess pleural effusion.  SIGNIFICANT EVENTS: 9/09 - unsuccessful percutaneous nephrostomy tube exchange 9/10 - urology consulted, placement of Rt ureteral double J stent 9/11 - oxygen desaturation noted >> CXR shows ATX 9/12 - d/c home 9/15 - present to [facility] ED with dyspnea >> Large Rt pleural effusion, PCCM consulted 9/16 - unable to perform thora, not enough fluid per IR, TCTS consulted for loculated Rt pleural effusion 9/21 - vaginal delivery baby boy ("[name]")  STUDIES:  9/16 CT chest >> mod Rt pleural effusion partially loculated, consolidation with air bronchograms RML and RLL  LINES / TUBES: 10/30/12 Rt nephrostomy tube >> out  CULTURES: Blood 9/15 >> GPC in clusters >>1/2 coag neg staff  ANTIBIOTICS: Rocephin 9/09 >> 9/11 Clindamycin 9/09 >> 9/09 Keflex 9/11 >> 9/15 Zosyn 9/15 >> Vancomycin 9/15 >>off    SUBJECTIVE: Tearful, anxious about VATS this am and baby boy having to stay at [facility] for bili-lights.  Still with cough, SOB.    VITAL SIGNS: Temp:  [98.8 F (37.1 C)-99.3 F (37.4 C)] 98.9 F (37.2 C) (09/24 0624) Pulse Rate:  [87-109] 87 (09/24 0624) Resp:  [16-20] 18 (09/24 0624) BP: (139-177)/(80-103) 177/103 mmHg (09/24 0624) SpO2:  [94 %-98 %] 94 % (09/24 0624) Room air  PHYSICAL EXAMINATION: General: no distress, tearful, anxious about surgery Neuro:   Alert, normal strength, moves all extremities HEENT:  No sinus tenderness Cardiovascular:  s1s2 regular Lungs: decreased breath sounds on Rt, few scattered rhonchi L, no wheeze Abdomen:  Post partum, non tender, hypoactive bs, fundus firm  Musculoskeletal: 2+ pitting edema  Skin:  Multiple tatoos, no rashes  Labs: CBC Recent Labs     01/06/13  0542  01/07/13  0543  01/07/13  1821  WBC  16.6*  16.8*  17.0*  HGB  7.8*  7.7*  8.3*  HCT  23.8*  23.6*  25.6*  PLT  364  388  479*   BMET Recent Labs     01/06/13  0542  01/07/13  0543  01/08/13  0455  NA  138  138  139  K  3.5  4.4  4.0  CL  108  107  107  CO2  21  21  19   BUN  21  23  24*  CREATININE  2.80*  2.65*  2.59*  GLUCOSE  85  74  80   Electrolytes Recent Labs     01/06/13  0542  01/07/13  0543  01/08/13  0455  CALCIUM  7.9*  8.3*  8.1*    Sepsis Markers No results found for this basename: LACTICACIDVEN, PROCALCITON, O2SATVEN,  in the last 72 hours Liver Enzymes Recent Labs     01/08/13  0455  AST  10  ALT  7  ALKPHOS  117  BILITOT  <0.1*  ALBUMIN  1.8*    Imaging Dg Chest 2 View  01/06/2013   CLINICAL  DATA:  Vaginal delivery yesterday. Follow up pleural effusion.  EXAM: CHEST  2 VIEW  COMPARISON:  Portable chest 12/31/2012. Chest CT 12/31/2012.  FINDINGS: The loculated right pleural effusion posterior laterally is not significantly changed. The underlying right basilar airspace opacities are stable. There is minimal left basilar atelectasis and no significant left pleural effusion. The visualized heart size and mediastinal contours are stable.  IMPRESSION: No significant change in loculated right pleural effusion and adjacent right basilar airspace disease. Findings are suspicious for pneumonia with associated empyema. Consider re attempt at thoracentesis.   Electronically Signed   By: Roxy Horseman   On: 01/06/2013 18:39      ASSESSMENT / PLAN:  PULMONARY: A: Rt sided loculated effusion with PNA >>  most likely from aspiration. P: -VATS planned 9/24 per Hendricks   CARDIAC A: SIRS/sepsis >> improved. P: -monitor hemodynamics  RENAL A: Rt hydronephrosis of pregnancy s/p Rt ureteral stent Acute renal insufficiency 9/18 >> ? From vancomycin>>Cr trending down slowly P: -monitor renal fx, urine outpt, electrolytes daily -urology following (Dalsthedt) - spoke with urology to f/u post op -f/u chem   HEMATOLOGY A:  Leukocytosis >> likely related to infection Anemia of pregnancy. P: -f/u CBC  -SCD for DVT prevention  GASTROENTEROLOGY A: Reflux in pregnancy. Constipation P: -protonix  -tums as needed -Miralax prn  OBSTETRICS A: 35 week pregnancy. S/p delivery AM 9/21 P: -per OB -[facility] with increased jaundice -6 week post partum appt made   INFECTION A: Aspiration pneumonia. GPC in cluster in one of two blood cultures >> likely contaminate; low concern for MRSA. P: -Zosyn as above -monitor cultures post VATS -d/c'd  vanc with increased Scr  ENDOCRINE A: No acute issues. P: -Monitor blood sugar on BMET  NEUROLOGY A: Pain control. P: -prn tylenol, percocet,  Will f/u post op   WHITEHEART,KATHRYN, NP 01/08/2013  10:51 AM Pager: [phone] or [phone]  *Care during the described time interval was provided by me and/or other providers on the critical care team. I have reviewed this patient's available data, including medical history, events of note, physical examination and test results as part of my evaluation.  I agree with the above note  Dorcas [place]

## 2013-01-09 ENCOUNTER — Inpatient Hospital Stay (HOSPITAL_COMMUNITY): Admission: RE | Admit: 2013-01-09 | Payer: Medicaid Other | Source: Ambulatory Visit

## 2013-01-09 ENCOUNTER — Ambulatory Visit (HOSPITAL_COMMUNITY): Admit: 2013-01-09 | Payer: Medicaid Other

## 2013-01-09 ENCOUNTER — Ambulatory Visit (HOSPITAL_COMMUNITY): Admission: RE | Admit: 2013-01-09 | Payer: Medicaid Other | Source: Ambulatory Visit

## 2013-01-09 ENCOUNTER — Inpatient Hospital Stay (HOSPITAL_COMMUNITY): Payer: Medicaid Other

## 2013-01-09 LAB — BASIC METABOLIC PANEL
BUN: 20 mg/dL (ref 6–23)
BUN: 16 mg/dL (ref 6–23)
CO2: 22 mEq/L (ref 19–32)
Calcium: 7.5 mg/dL — ABNORMAL LOW (ref 8.4–10.5)
Calcium: 6.3 mg/dL — CL (ref 8.4–10.5)
Chloride: 108 mEq/L (ref 96–112)
Chloride: 110 mEq/L (ref 96–112)
Creatinine, Ser: 2.36 mg/dL — ABNORMAL HIGH (ref 0.50–1.10)
GFR calc Af Amer: 39 mL/min — ABNORMAL LOW (ref >90)
Glucose, Bld: 83 mg/dL (ref 70–99)
Glucose, Bld: 91 mg/dL (ref 70–99)
Potassium: 3.3 mEq/L — ABNORMAL LOW (ref 3.5–5.1)
Sodium: 138 mEq/L (ref 135–145)

## 2013-01-09 LAB — POCT I-STAT 3, ART BLOOD GAS (G3+)
Acid-base deficit: 2 mmol/L (ref 0.0–2.0)
Bicarbonate: 22.4 mEq/L (ref 20.0–24.0)
O2 Saturation: 97 %
Patient temperature: 99.7
TCO2: 23 mmol/L (ref 0–100)
pH, Arterial: 7.4 (ref 7.350–7.450)

## 2013-01-09 LAB — CBC
HCT: 20.7 % — ABNORMAL LOW (ref 36.0–46.0)
HCT: 26.4 % — ABNORMAL LOW (ref 36.0–46.0)
Hemoglobin: 6.8 g/dL — CL (ref 12.0–15.0)
Hemoglobin: 8.5 g/dL — ABNORMAL LOW (ref 12.0–15.0)
MCH: 24.8 pg — ABNORMAL LOW (ref 26.0–34.0)
MCHC: 32.9 g/dL (ref 30.0–36.0)
MCHC: 32.2 g/dL (ref 30.0–36.0)
MCV: 75.5 fL — ABNORMAL LOW (ref 78.0–100.0)
RBC: 2.74 MIL/uL — ABNORMAL LOW (ref 3.87–5.11)
RBC: 3.4 MIL/uL — ABNORMAL LOW (ref 3.87–5.11)
WBC: 15.1 10*3/uL — ABNORMAL HIGH (ref 4.0–10.5)
WBC: 16.4 10*3/uL — ABNORMAL HIGH (ref 4.0–10.5)

## 2013-01-09 MED ORDER — SODIUM CHLORIDE 0.9 % IJ SOLN: 9.0000 mL | INTRAMUSCULAR | Status: DC | PRN

## 2013-01-09 MED ORDER — DIPHENHYDRAMINE HCL 12.5 MG/5ML PO ELIX
12.5000 mg | ORAL_SOLUTION | Freq: Four times a day (QID) | ORAL | Status: DC | PRN
  Administered 2013-01-09 – 2013-01-11 (×3): 12.5 mg via ORAL
  Filled 2013-01-09 (×2): qty 5

## 2013-01-09 MED ORDER — NALOXONE HCL 0.4 MG/ML IJ SOLN: 0.4000 mg | INTRAMUSCULAR | Status: DC | PRN

## 2013-01-09 MED ORDER — OXYBUTYNIN CHLORIDE 5 MG PO TABS
5.0000 mg | ORAL_TABLET | Freq: Three times a day (TID) | ORAL | Status: DC
  Administered 2013-01-09 – 2013-01-11 (×7): 5 mg via ORAL
  Filled 2013-01-09 (×9): qty 1

## 2013-01-09 MED ORDER — PANTOPRAZOLE SODIUM 40 MG PO TBEC: 40.0000 mg | DELAYED_RELEASE_TABLET | Freq: Every day | ORAL | Status: DC

## 2013-01-09 MED ORDER — FUROSEMIDE 10 MG/ML IJ SOLN
20.0000 mg | Freq: Four times a day (QID) | INTRAMUSCULAR | Status: AC
  Administered 2013-01-09 (×2): 20 mg via INTRAVENOUS
  Filled 2013-01-09 (×2): qty 2

## 2013-01-09 MED ORDER — HYDROMORPHONE 0.3 MG/ML IV SOLN
INTRAVENOUS | Status: DC
  Administered 2013-01-09: 8.4 mg via INTRAVENOUS
  Administered 2013-01-09 – 2013-01-10 (×5): via INTRAVENOUS
  Administered 2013-01-10: 2.1 mg via INTRAVENOUS
  Administered 2013-01-10: 3.3 mg via INTRAVENOUS
  Administered 2013-01-10: 7.61 mg via INTRAVENOUS
  Administered 2013-01-10: 4.5 mg via INTRAVENOUS
  Administered 2013-01-10 – 2013-01-11 (×2): 3.9 mg via INTRAVENOUS
  Administered 2013-01-11 (×2): via INTRAVENOUS
  Administered 2013-01-11: 3.6 mg via INTRAVENOUS
  Administered 2013-01-11: 3.57 mg via INTRAVENOUS
  Filled 2013-01-09 (×8): qty 25

## 2013-01-09 MED ORDER — DIPHENHYDRAMINE HCL 50 MG/ML IJ SOLN
12.5000 mg | Freq: Four times a day (QID) | INTRAMUSCULAR | Status: DC | PRN
  Administered 2013-01-09: 12.5 mg via INTRAVENOUS

## 2013-01-09 MED ORDER — ONDANSETRON HCL 4 MG/2ML IJ SOLN: 4.0000 mg | Freq: Four times a day (QID) | INTRAMUSCULAR | Status: DC | PRN

## 2013-01-09 NOTE — Progress Notes (Signed)
PM ROUNDS  Feels much better with Dilaudid PCA- "less pain and don't fell as drugged"  BP 160/96  Pulse 59  Temp(Src) 98.2 F (36.8 C) (Oral)  Resp 24  Ht 5\' 2"  (1.575 m)  Wt 217 lb 7 oz (98.629 kg)  BMI 39.76 kg/m2  SpO2 98%  LMP 05/13/2012  Breastfeeding? Unknown   Intake/Output Summary (Last 24 hours) at 01/09/13 1721 Last data filed at 01/09/13 1600  Gross per 24 hour  Intake 3312.08 ml  Output   4295 ml  Net -982.92 ml    Doing well POD # 1  Awaiting bed on 3S

## 2013-01-09 NOTE — Progress Notes (Signed)
eLink Physician-Brief Progress Note Patient Name: [name] [name] DOB: 14-Apr-1991 MRN: [id number]  Date of Service  01/09/2013   HPI/Events of Note  Hgb 6.8 down from 8.3 s/p VATS   eICU Interventions  Plan: Transfuse 1 unit of pRBC Post-transfusion CBC   Intervention Category Intermediate Interventions: Bleeding - evaluation and treatment with blood products  Zuleyka Kloc 01/09/2013, 5:17 AM

## 2013-01-09 NOTE — Progress Notes (Signed)
Received Critical Calcium level from Koren Bound, RN from [facility] drawn at 1715. E-link notified at 1823 and aware that Albumin level from a.m. Lab draw corrects calcium to 8.06. RN from e-link states she wishes for MD to speak with me and my extension is given to her at this time. Will continue to monitor closely as pt. Prepares for transfer. Gildardo Pounds

## 2013-01-09 NOTE — Progress Notes (Addendum)
PULMONARY  / CRITICAL CARE MEDICINE  Name: [name] [name] MRN: [id number] DOB: 02-Jun-1990    ADMISSION DATE:  12/30/2012 CONSULTATION DATE:  12/30/2012  REFERRING MD :  Tawanna Cooler Meisinger  CHIEF COMPLAINT: Short of breath  BRIEF PATIENT DESCRIPTION:  22 y/o female in 34th week of pregnancy had Rt hydronephrosis s/p percutaneous nephrostomy tube.  She had attempted tube exchange on 9/09 >> complicated by contrast extravasation.  Developed progressive dyspnea, Rt pleuritic chest pain, hypoxia, and fever up to 103F.  Presented again to [facility] ER and found to have large rt pleural effusion.  PCCM consulted to assess pleural effusion.  SIGNIFICANT EVENTS: 9/09 - unsuccessful percutaneous nephrostomy tube exchange 9/10 - urology consulted, placement of Rt ureteral double J stent 9/11 - oxygen desaturation noted >> CXR shows ATX 9/12 - d/c home 9/15 - present to [facility] ED with dyspnea >> Large Rt pleural effusion, PCCM consulted 9/16 - unable to perform thora, not enough fluid per IR, TCTS consulted for loculated Rt pleural effusion 9/21 - vaginal delivery baby boy ("[name]")  STUDIES:  9/16 CT chest >> mod Rt pleural effusion partially loculated, consolidation with air bronchograms RML and RLL  LINES / TUBES: 10/30/12 Rt nephrostomy tube >> out  CULTURES: Blood 9/15 >> GPC in clusters >>1/2 coag neg staph  ANTIBIOTICS: Rocephin 9/09 >> 9/11 Clindamycin 9/09 >> 9/09 Keflex 9/11 >> 9/15 Zosyn 9/15 >> Vancomycin 9/15 >>D/C 9/25    SUBJECTIVE: SOme pain this am.  Fentanyl does not work well per pt.    VITAL SIGNS: Temp:  [97.7 F (36.5 C)-99.7 F (37.6 C)] 98 F (36.7 C) (09/25 0758) Pulse Rate:  [73-116] 92 (09/25 0800) Resp:  [13-30] 20 (09/25 0830) BP: (125-181)/(70-123) 147/95 mmHg (09/25 0800) SpO2:  [85 %-98 %] 95 % (09/25 0830) Arterial Line BP: (173-197)/(91-114) 177/114 mmHg (09/24 1900) [place] oxygen  PHYSICAL EXAMINATION: General: no distress, c/o pain Neuro:  Alert, normal  strength, moves all extremities HEENT:  No sinus tenderness Cardiovascular:  s1s2 regular Lungs: decreased BS on R. Two CTs in place  Abdomen:  Post partum, non tender, hypoactive bs, fundus firm  Musculoskeletal: 2+ pitting edema  Skin:  Multiple tatoos, no rashes  Labs: CBC Recent Labs     01/07/13  0543  01/07/13  1821  01/09/13  0435  WBC  16.8*  17.0*  15.1*  HGB  7.7*  8.3*  6.8*  HCT  23.6*  25.6*  20.7*  PLT  388  479*  518*   BMET Recent Labs     01/07/13  0543  01/08/13  0455  01/09/13  0435  NA  138  139  139  K  4.4  4.0  4.4  CL  107  107  108  CO2  21  19  22   BUN  23  24*  20  CREATININE  2.65*  2.59*  2.36*  GLUCOSE  74  80  83   Electrolytes Recent Labs     01/07/13  0543  01/08/13  0455  01/09/13  0435  CALCIUM  8.3*  8.1*  7.5*    Sepsis Markers No results found for this basename: LACTICACIDVEN, PROCALCITON, O2SATVEN,  in the last 72 hours Liver Enzymes Recent Labs     01/08/13  0455  AST  10  ALT  7  ALKPHOS  117  BILITOT  <0.1*  ALBUMIN  1.8*    Imaging Dg Chest Port 1 View  01/09/2013   CLINICAL DATA:  Post  right lung surgery.  EXAM: PORTABLE CHEST - 1 VIEW  COMPARISON:  01/08/2013.  FINDINGS: Right central line tip mid superior vena cava level.  Two right-sided chest tubes are in place. Tiny right apical pneumothorax suspected.  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 Etiology indeterminate. Infectious infiltrate not excluded in the proper clinical setting. Pulmonary edema may partially contribute to these findings. Please see recent chest CT report.  IMPRESSION: Two right-sided chest tubes are in place. Tiny right apical pneumothorax suspected.  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 Etiology indeterminate. Infectious infiltrate not excluded in the proper clinical setting. Pulmonary edema may partially contribute to these findings. Please see recent chest CT report.   Electronically Signed   By: Bridgett Larsson   On:  01/09/2013 07:46   Dg Chest Portable 1 View  01/08/2013   CLINICAL DATA:  22 year old female status post surgery.  EXAM: PORTABLE CHEST - 1 VIEW  COMPARISON:  01/06/2013 and earlier.  FINDINGS: Inserts MRI 1650 hrs. Right IJ approach central venous catheter, tip just below the carinal. The patient is rotated to the right. There are 2 right side chest tubes in place. Small volume right chest wall subcutaneous gas. No definite pneumothorax. Right lung ventilation not significantly changed. Left lung ventilation appears stable. Stable cardiac size and mediastinal contours. Incidental gas in the esophagus at the thoracic inlet.  IMPRESSION: 1. Right chest tubes in place. No definite pneumothorax. Stable right lung ventilation.  2. Right IJ central catheter, tip at the lower SVC level.   Electronically Signed   By: Augusto Gamble M.D.   On: 01/08/2013 17:15      ASSESSMENT / PLAN:  PULMONARY: A: PNA >> most likely from aspiration. S/p VATS with empyema 9/24 P: -pulm toilet -IS -lasix -oxygen titration   CARDIAC A: SIRS/sepsis >> improved. P: -monitor hemodynamics  RENAL A: Rt hydronephrosis of pregnancy s/p Rt ureteral stent Acute renal insufficiency 9/18 >> ? From vancomycin>>Cr trending down slowly P: -monitor renal fx, urine outpt, electrolytes daily -urology following (Dalsthedt) - spoke with urology to f/u post op -f/u chem   HEMATOLOGY A:  Leukocytosis >> likely related to infection Anemia of pregnancy. P: -f/u CBC  -SCD for DVT prevention  GASTROENTEROLOGY A: Reflux in pregnancy. Constipation P: -protonix  -tums as needed -Miralax prn  OBSTETRICS A: 35 week pregnancy. S/p delivery AM 9/21 P: -per OB -[facility]-lights with increased jaundice -6 week post partum appt made   INFECTION A: Aspiration pneumonia. GPC in cluster in one of two blood cultures >> likely contaminate; low concern for MRSA. P: -Zosyn as above -monitor cultures  post VATS -d/c'd  vanc with increased Scr  ENDOCRINE A: No acute issues. P: -Monitor blood sugar on BMET  NEUROLOGY A: Pain control. P: -prn tylenol, percocet,  Will Keep on PCCM primary SVC . I apprec Dr Hendrickson's help   Dorcas Carrow Beeper  [phone]  Cell  [phone]  If no response or cell goes to voicemail, call beeper [phone]  01/09/2013  8:50 AM

## 2013-01-09 NOTE — Progress Notes (Signed)
1 Day Post-Op Procedure(s) (LRB): VIDEO ASSISTED THORACOSCOPY DECORTICATION; Drainage of Pleural Effusion (Right) Subjective: Some incisional pain No nausea or SOB   Objective: Vital signs in last 24 hours: Temp:  [97.7 F (36.5 C)-99.7 F (37.6 C)] 98 F (36.7 C) (09/25 0758) Pulse Rate:  [73-116] 98 (09/25 0745) Cardiac Rhythm:  [-] Normal sinus rhythm (09/25 0700) Resp:  [13-30] 20 (09/25 0800) BP: (125-181)/(70-123) 143/93 mmHg (09/25 0730) SpO2:  [85 %-98 %] 95 % (09/25 0800) Arterial Line BP: (173-197)/(91-114) 177/114 mmHg (09/24 1900)  Hemodynamic parameters for last 24 hours:    Intake/Output from previous day: 09/24 0701 - 09/25 0700 In: 3862.1 [P.O.:550; I.V.:3024.6; Blood:87.5; IV Piggyback:200] Out: 4220 [Urine:3850; Blood:50; Chest Tube:320] Intake/Output this shift: Total I/O In: 175 [Blood:75; IV Piggyback:100] Out: -   General appearance: alert and no distress Neurologic: intact Heart: regular rate and rhythm Lungs: diminished breath sounds on right no air leak, serosanguinous drainage from CT  Lab Results:  Recent Labs  01/07/13 1821 01/09/13 0435  WBC 17.0* 15.1*  HGB 8.3* 6.8*  HCT 25.6* 20.7*  PLT 479* 518*   BMET:  Recent Labs  01/08/13 0455 01/09/13 0435  NA 139 139  K 4.0 4.4  CL 107 108  CO2 19 22  GLUCOSE 80 83  BUN 24* 20  CREATININE 2.59* 2.36*  CALCIUM 8.1* 7.5*    PT/INR:  Recent Labs  01/08/13 0455  LABPROT 12.9  INR 0.99   ABG    Component Value Date/Time   PHART 7.400 01/09/2013 0437   HCO3 22.4 01/09/2013 0437   TCO2 23 01/09/2013 0437   ACIDBASEDEF 2.0 01/09/2013 0437   O2SAT 97.0 01/09/2013 0437   CBG (last 3)  No results found for this basename: GLUCAP,  in the last 72 hours  Assessment/Plan: S/P Procedure(s) (LRB): VIDEO ASSISTED THORACOSCOPY DECORTICATION; Drainage of Pleural Effusion (Right) - CV- stable  RESP- s/p decortication  No air leak, but will keep both CT to suction today  Vanco and  zosyn pending culture results  RENAL- ARF improving, follow, keep foley today  SCD for DVT prophylaxis, no lovenox due to bleeding risk  Anemia acute blood loss in addition to postpartum- CCM ordered transfusion   Ambulate  Pain control- requesting dilaudid instead of fentanyl- will change   LOS: 10 days    HENDRICKSON,STEVEN C 01/09/2013

## 2013-01-09 NOTE — Op Note (Signed)
NAMEALLAN, [name] [name]                 ACCOUNT NO.:  [id number]  MEDICAL RECORD NO.:  [id number]  LOCATION:  2S13C                        FACILITY:  [facility]  PHYSICIAN:  Salvatore Decent. Dorris Fetch, M.D.DATE OF BIRTH:  Oct 16, 1990  DATE OF PROCEDURE:  01/08/2013 DATE OF DISCHARGE:                              OPERATIVE REPORT   PREOPERATIVE DIAGNOSIS:  Loculated right pleural effusion.  POSTOPERATIVE DIAGNOSIS:  Early organizing empyema.  PROCEDURES:  Right video-assisted thoracoscopy, drainage of pleural effusion, visceral and parietal pleural decortication.  SURGEON:  Salvatore Decent. Dorris Fetch, MD  ASSISTANTS:  Rowe Clack, PA-C  ANESTHESIA:  General.  FINDINGS:  Early organizing empyema with extensive fibrinous exudate on both visceral and parietal pleura, peeled off lung relatively easily, Multiple loculated fluid collections.  Specimen sent for pathology and microbiology.  INDICATIONS FOR PROCEDURE:  [name] [name] is a 22 year old woman who is recently postpartum.  She first presented when she was 35 weeks' Pregnant. After having an attempted percutaneous nephrostomy tube change, she developed fever, shortness of breath, and pleuritic chest pain. Chest x-ray showed a right pleural effusion.  An attempt was made to drain this with thoracentesis, but no fluid could be withdrawn.  CT chest showed a loculated pleural effusion with compressive atelectasis.  She was started on antibiotics.  After consultation with Obstetrics, the decision was made to induce and have her deliver and then after delivery, she would return for VATS, drainage of pleural effusion and decortication.  Her delivery was uncomplicated.  Prior to her thoracic procedure, the indications, risks, benefits, and alternatives were discussed in detail with the patient.  She understood and accepted the risks and agreed to proceed.  DESCRIPTION OF PROCEDURE:  [name] [name] was brought to the preoperative holding area on November 23, 2012, there Anesthesia placed a central line and arterial blood pressure monitoring line.  She was taken to the operating room, anesthetized, and intubated.  Intravenous antibiotics were administered.  PAS hose were placed for deep vein thrombosis prophylaxis.  A Foley catheter was placed.  She was placed in a left lateral decubitus position and the right chest was prepped and draped in the usual sterile fashion.  Single lung ventilation of the left lung was carried out and was tolerated well throughout the procedure.  An incision was made in approximately the seventh intercostal space in the anterior axillary line, was carried through the skin and subcutaneous tissue.  The chest was entered gently and bluntly using a hemostat.   Initial adhesions were taken down with an index finger. A port was inserted and the thoracoscope was placed.  There was an obvious early organizing empyema.  Visualization was very limited.  A second incision then was made in approximately fourth intercostal space in the anterior axillary line and was carried through the skin and subcutaneous tissue.  The chest was again entered bluntly using a hemostat.  This incision was slightly larger than the original port incision and the majority of the work was done through this incision. Fluid was evacuated, it was sent for cultures for AFB, fungus and Bacteria, as well as Pathology.  Through this second incision, the adhesions could be taken down sufficiently to  allow adequate visualization.  Posterior and laterally, the lung was essentially encased in a fibrinous peel, as was the chest wall.  More medially and apically, there was less intense inflammatory reaction.  Adhesions were taken down and all fluid was evacuated.  The tissue was friable and there was bleeding from the parietal pleura.  Parietal pleural decortication was performed.  A portion of the specimen was sent for pathology and another portion was sent  for cultures for fungus, AFB, and bacteria.  After completing the parietal pleural decortication, the chest was copiously irrigated with warm saline.  The visceral pleural decortication then was performed.  The peel came off relatively easily except at the margins of the lung where it was a little more difficult and tenacious.  There were more intense adhesions and inflammatory peel posteriorly and inferiorly on the right lower lobe. These were taken down carefully and there were minimal or no tears to the visceral pleura with a decortication.  A test inflation showed that all lobes re-expanded well.  After irrigating the chest, two 32-French chest tubes were placed, the more posterior one was placed along the diaphragm and then superiorly along the paravertebral gutter.  The other tube was placed laterally to the apex.  These were both secured with #1 silk sutures.  Dual lung ventilation then was carried out.  The remaining working incision was closed with #1 Vicryl fascial suture followed by 2-0 Vicryl subcutaneous suture and a 3-0 Vicryl subcuticular suture.  All sponge, needle, and instrument counts were correct at the end of the procedure.  The patient was taken from the operating room to the postanesthetic care unit in good condition.     Salvatore Decent Dorris Fetch, M.D.     SCH/MEDQ  D:  01/08/2013  T:  01/09/2013  Job:  [id number]

## 2013-01-09 NOTE — Clinical Social Work Note (Signed)
CSW met with pt after receiving a call from [facility] social worker stating that pt's baby may be discharged today.  Pt stated that she signed the paperwork to have pt go home with father.  Pt and baby's father live with pt's father.  Pt's sister will also assist with the care of pt's baby.  Sister has a 42 month old baby.    Pt is happy to hear her baby is doing well and she looks forward to her own discharge within the next few days.  CSW passed on information to [facility] CSW, [place].

## 2013-01-09 NOTE — Progress Notes (Signed)
CRITICAL VALUE ALERT  Critical value received:  Hemoglobin 6.8  Date of notification:  01/09/13  Time of notification:  0512  Critical value read back:yes  Nurse who received alert:  Viviano Simas, RN- Eda Paschal, RN notified at [phone] 01/09/13  MD notified (1st page):   Time of first page:    MD notified (2nd page):  Time of second page:  Responding MD:    Time MD responded:

## 2013-01-09 NOTE — Progress Notes (Signed)
ANTIBIOTIC CONSULT NOTE - FOLLOW UP  Pharmacy Consult for Zosyn Indication: asp pna, empyema  Allergies  Allergen Reactions  . Cinnamon Anaphylaxis and Hives  . Shellfish Allergy Anaphylaxis  . Augmentin [Amoxicillin-Pot Clavulanate] Nausea And Vomiting  . Nickel Rash    Patient Measurements: Height: 5\' 2"  (157.5 cm) Weight: 217 lb 7 oz (98.629 kg) IBW/kg (Calculated) : 50.1  Vital Signs: Temp: 98.2 F (36.8 C) (09/25 0845) Temp src: Oral (09/25 0845) BP: 153/101 mmHg (09/25 0900) Pulse Rate: 97 (09/25 0900) Intake/Output from previous day: 09/24 0701 - 09/25 0700 In: 3862.1 [P.O.:550; I.V.:3024.6; Blood:87.5; IV Piggyback:200] Out: 4220 [Urine:3850; Blood:50; Chest Tube:320] Intake/Output from this shift: Total I/O In: 850 [P.O.:500; I.V.:175; Blood:75; IV Piggyback:100] Out: 270 [Urine:270]  Labs:  Recent Labs  01/07/13 0543 01/07/13 1821 01/08/13 0455 01/09/13 0435  WBC 16.8* 17.0*  --  15.1*  HGB 7.7* 8.3*  --  6.8*  PLT 388 479*  --  518*  CREATININE 2.65*  --  2.59* 2.36*   Estimated Creatinine Clearance: 40.7 ml/min (by C-G formula based on Cr of 2.36). No results found for this basename: VANCOTROUGH, Leodis Binet, VANCORANDOM, GENTTROUGH, GENTPEAK, GENTRANDOM, TOBRATROUGH, TOBRAPEAK, TOBRARND, AMIKACINPEAK, AMIKACINTROU, AMIKACIN,  in the last 72 hours   Microbiology: Recent Results (from the past 720 hour(s))  URINE CULTURE     Status: None   Collection Time    12/25/12  2:53 PM      Result Value Range Status   Specimen Description     Final   Value: URINE, RANDOM     Performed at [facility]   Special Requests     Final   Value: NONE     Performed at [facility]   Culture  Setup Time     Final   Value: 12/25/2012 21:50     Performed at [facility]   Colony Count     Final   Value: NO GROWTH     Performed at [facility]   Culture     Final   Value: NO GROWTH     Performed at [facility]   Report Status 12/26/2012 FINAL   Final  CULTURE, BLOOD (ROUTINE X 2)     Status: None   Collection Time    12/30/12  5:40 PM      Result Value Range Status   Specimen Description BLOOD LEFT ARM   Final   Special Requests     Final   Value: BOTTLES DRAWN AEROBIC AND ANAEROBIC 6CC EACH BOTTLE   Culture  Setup Time     Final   Value: 12/30/2012 22:28     Performed at [facility]   Culture     Final   Value: NO GROWTH 5 DAYS     Performed at [facility]   Report Status 01/05/2013 FINAL   Final  CULTURE, BLOOD (ROUTINE X 2)     Status: None   Collection Time    12/30/12  5:50 PM      Result Value Range Status   Specimen Description BLOOD RIGHT ARM   Final   Special Requests BOTTLES DRAWN AEROBIC ONLY 3CC   Final   Culture  Setup Time     Final   Value: 12/30/2012 22:28     Performed at [facility]   Culture     Final   Value: STAPHYLOCOCCUS SPECIES (COAGULASE NEGATIVE)     Note: THE SIGNIFICANCE OF  ISOLATING THIS ORGANISM FROM A SINGLE SET OF BLOOD CULTURES WHEN MULTIPLE SETS ARE DRAWN IS UNCERTAIN. PLEASE NOTIFY THE MICROBIOLOGY DEPARTMENT WITHIN ONE WEEK IF SPECIATION AND SENSITIVITIES ARE REQUIRED.     1244 Note: Gram Stain Report Called to,Read Back By and Verified With: MARY BETH [facility] 01/01/2013 [facility]     Performed at [facility]   Report Status 01/02/2013 FINAL   Final  MRSA PCR SCREENING     Status: None   Collection Time    12/30/12  9:21 PM      Result Value Range Status   MRSA by PCR NEGATIVE  NEGATIVE Final   Comment:            The GeneXpert MRSA Assay (FDA     approved for NASAL specimens     only), is one component of a     comprehensive MRSA colonization     surveillance program. It is not     intended to diagnose MRSA     infection nor to guide or     monitor treatment for     MRSA infections.  SURGICAL PCR SCREEN     Status: None   Collection Time    01/08/13  6:54 AM      Result Value Range Status   MRSA,  PCR NEGATIVE  NEGATIVE Final   Staphylococcus aureus NEGATIVE  NEGATIVE Final   Comment:            The Xpert SA Assay (FDA     approved for NASAL specimens     in patients over 73 years of age),     is one component of     a comprehensive surveillance     program.  Test performance has     been validated by [employer] for patients greater     than or equal to 92 year old.     It is not intended     to diagnose infection nor to     guide or monitor treatment.  BODY FLUID CULTURE     Status: None   Collection Time    01/08/13  2:17 PM      Result Value Range Status   Specimen Description PLEURAL FLUID RIGHT   Final   Special Requests SAMPLE NO 1 PT ON ZINACEF   Final   Gram Stain     Final   Value: FEW WBC PRESENT,BOTH PMN AND MONONUCLEAR     NO ORGANISMS SEEN     Performed at [facility]   Culture PENDING   Incomplete   Report Status PENDING   Incomplete  TISSUE CULTURE     Status: None   Collection Time    01/08/13  2:23 PM      Result Value Range Status   Specimen Description TISSUE PLEURAL RIGHT   Final   Special Requests NO 2 PLEURAL EXUDATE   Final   Gram Stain     Final   Value: NO WBC SEEN     NO ORGANISMS SEEN     Performed at [facility]   Culture     Final   Value: NO GROWTH     Performed at [facility]   Report Status PENDING   Incomplete    Anti-infectives   Start     Dose/Rate Route Frequency Ordered Stop   01/09/13 0100  vancomycin (VANCOCIN) IVPB 1000 mg/200 mL premix     1,000 mg  200 mL/hr over 60 Minutes Intravenous Every 12 hours 01/08/13 1932 01/09/13 0242   01/08/13 0600  vancomycin (VANCOCIN) IVPB 1000 mg/200 mL premix     1,000 mg 200 mL/hr over 60 Minutes Intravenous On call to O.R. 01/08/13 0006 01/08/13 1330   12/31/12 2200  vancomycin (VANCOCIN) 1,500 mg in sodium chloride 0.9 % 500 mL IVPB  Status:  Discontinued     1,500 mg 250 mL/hr over 120 Minutes Intravenous Every 8 hours 12/31/12 2054 01/02/13 1044    12/30/12 2000  vancomycin (VANCOCIN) IVPB 1000 mg/200 mL premix  Status:  Discontinued     1,000 mg 200 mL/hr over 60 Minutes Intravenous Every 8 hours 12/30/12 1911 12/31/12 2054   12/30/12 1830  piperacillin-tazobactam (ZOSYN) IVPB 3.375 g     3.375 g 12.5 mL/hr over 240 Minutes Intravenous Every 8 hours 12/30/12 1801        Assessment: 22 y.o. female continues on zosyn (Day #11) for asp pna, empyema. S/p VATS, drainage, decortication on 9/24. Afebrile, WBC 16.4 (relatively stable). Note pt currently off Vanc with renal dysfunction and MRSA not suspected per CCM note.  Vanc 9/15>>9/25 Zosyn 9/15>>  9/24 Pleural tissue>> 9/24 Pleural fluid>> 9/15 blood>> 1/2 CONS - probable contaminant 9/10 urine>> neg  Goal of Therapy:  Eradication of infection  Plan:  1) Zosyn 3.375 gram IV q8h (4 hour infusion).  2) F/u new pleural cx, renal function, length of antibiotic therapy   Christoper Fabian, PharmD, BCPS Clinical pharmacist, pager [phone] 01/09/2013,10:37 AM

## 2013-01-10 ENCOUNTER — Encounter (HOSPITAL_COMMUNITY): Payer: Self-pay | Admitting: Thoracic Surgery (Cardiothoracic Vascular Surgery)

## 2013-01-10 ENCOUNTER — Inpatient Hospital Stay (HOSPITAL_COMMUNITY): Payer: Medicaid Other

## 2013-01-10 DIAGNOSIS — J869 Pyothorax without fistula: Secondary | ICD-10-CM

## 2013-01-10 LAB — CBC
HCT: 26.6 % — ABNORMAL LOW (ref 36.0–46.0)
Hemoglobin: 8.4 g/dL — ABNORMAL LOW (ref 12.0–15.0)
MCH: 24.3 pg — ABNORMAL LOW (ref 26.0–34.0)
MCV: 76.9 fL — ABNORMAL LOW (ref 78.0–100.0)
Platelets: 505 10*3/uL — ABNORMAL HIGH (ref 150–400)
RBC: 3.46 MIL/uL — ABNORMAL LOW (ref 3.87–5.11)

## 2013-01-10 LAB — COMPREHENSIVE METABOLIC PANEL
AST: 10 U/L (ref 0–37)
BUN: 19 mg/dL (ref 6–23)
CO2: 23 mEq/L (ref 19–32)
Calcium: 8.2 mg/dL — ABNORMAL LOW (ref 8.4–10.5)
Creatinine, Ser: 2.62 mg/dL — ABNORMAL HIGH (ref 0.50–1.10)
GFR calc Af Amer: 28 mL/min — ABNORMAL LOW (ref >90)
GFR calc non Af Amer: 25 mL/min — ABNORMAL LOW (ref >90)
Glucose, Bld: 79 mg/dL (ref 70–99)
Sodium: 136 mEq/L (ref 135–145)
Total Bilirubin: 0.1 mg/dL — ABNORMAL LOW (ref 0.3–1.2)

## 2013-01-10 LAB — TYPE AND SCREEN
ABO/RH(D): AB POS
Antibody Screen: NEGATIVE
Unit division: 0

## 2013-01-10 MED ORDER — CALCIUM CARBONATE ANTACID 500 MG PO CHEW
1.0000 | CHEWABLE_TABLET | Freq: Every day | ORAL | Status: DC | PRN
  Filled 2013-01-10: qty 1

## 2013-01-10 MED ORDER — GUAIFENESIN ER 600 MG PO TB12
1200.0000 mg | ORAL_TABLET | Freq: Two times a day (BID) | ORAL | Status: DC
  Administered 2013-01-10 – 2013-01-11 (×3): 1200 mg via ORAL
  Filled 2013-01-10 (×4): qty 2

## 2013-01-10 MED ORDER — CALCIUM CARBONATE ANTACID 500 MG PO CHEW
1.0000 | CHEWABLE_TABLET | Freq: Every day | ORAL | Status: DC
  Filled 2013-01-10: qty 1

## 2013-01-10 MED ORDER — OXYCODONE HCL 5 MG PO TABS
5.0000 mg | ORAL_TABLET | ORAL | Status: DC | PRN
  Administered 2013-01-10 – 2013-01-11 (×6): 10 mg via ORAL
  Administered 2013-01-11: 5 mg via ORAL
  Administered 2013-01-11: 10 mg via ORAL
  Filled 2013-01-10 (×8): qty 2

## 2013-01-10 NOTE — Progress Notes (Addendum)
      [address]       [place] [address]             [phone]       2 Days Post-Op Procedure(s) (LRB): VIDEO ASSISTED THORACOSCOPY DECORTICATION; Drainage of Pleural Effusion (Right)  Subjective: Patient with some soreness at chest tube sites and complaint of reflux. She really wants to go home as soon as possible as just had a son.  Objective: Vital signs in last 24 hours: Temp:  [98 F (36.7 C)-99.4 F (37.4 C)] 99 F (37.2 C) (09/26 0742) Pulse Rate:  [59-118] 99 (09/26 0742) Cardiac Rhythm:  [-] Sinus tachycardia (09/26 0331) Resp:  [16-31] 19 (09/26 0742) BP: (132-167)/(77-110) 143/93 mmHg (09/26 0742) SpO2:  [90 %-98 %] 91 % (09/26 0742) FiO2 (%):  [0 %] 0 % (09/25 0953)      Intake/Output from previous day: 09/25 0701 - 09/26 0700 In: 2992 [P.O.:1460; I.V.:1257; Blood:75; IV Piggyback:200] Out: 5020 [Urine:4960; Chest Tube:60]   Physical Exam:  Cardiovascular: Slightly tachy Pulmonary: Diminished at right base; no rales, wheezes, or rhonchi. Abdomen: Soft, non tender, bowel sounds present. Wounds: Clean and dry.  No erythema or signs of infection. Chest Tube: to water seal and there is no air leak  Lab Results: CBC: Recent Labs  01/09/13 1010 01/10/13 0400  WBC 16.4* 17.5*  HGB 8.5* 8.4*  HCT 26.4* 26.6*  PLT 527* 505*   BMET:  Recent Labs  01/09/13 1600 01/10/13 0400  NA 138 136  K 3.3* 4.0  CL 110 101  CO2 18* 23  GLUCOSE 91 79  BUN 16 19  CREATININE 2.03* 2.62*  CALCIUM 6.3* 8.2*    PT/INR:  Recent Labs  01/08/13 0455  LABPROT 12.9  INR 0.99   ABG:  INR: Will add last result for INR, ABG once components are confirmed Will add last 4 CBG results once components are confirmed  Assessment/Plan:  1. CV - SR 2.  Pulmonary - Chest tube with 60 cc of output last 24 hours. There is no air leak. CXR this am shows patient rotated to the right, no pneumothorax, some improvement in aeration. Encourage incentive  spirometer and flutter valve. Cultures show no AFB, fungus, and no growth to date. On Zosyn.Pathlogy showed BENIGN PLEURA WITH ASSOCIATED FIBRIN, MINIMAL SCATTERED ACUTE AND CHRONIC INFLAMMATORY CELLS AND REACTIVE CHANGES. - NO ATYPIA OR MALIGNANCY IDENTIFIED. 3.Foley removal per urology. Creatinine up to 2.6. She has had right hydronephrosis and had ureteral stent placed.   ZIMMERMAN,DONIELLE MPA-C 01/10/2013,7:54 AM  Patient seen and examined, agree with above. Her CT output has decreased- will place to water seal- hopefully can remove one or both tubes in AM She is anxious to get home to her newborn Creatinine up today- may be related to stent issues, but will check for urinary eosinophils since she is on zosyn Hopefully can get home Sunday

## 2013-01-10 NOTE — Progress Notes (Signed)
PULMONARY  / CRITICAL CARE MEDICINE  Name: [name] [name] MRN: [id number] DOB: Jul 15, 1990    ADMISSION DATE:  12/30/2012 CONSULTATION DATE:  12/30/2012  REFERRING MD :  Tawanna Cooler Meisinger  CHIEF COMPLAINT: Short of breath  BRIEF PATIENT DESCRIPTION:  22 y/o female in 34th week of pregnancy had Rt hydronephrosis s/p percutaneous nephrostomy tube.  She had attempted tube exchange on 9/09 >> complicated by contrast extravasation.  Developed progressive dyspnea, Rt pleuritic chest pain, hypoxia, and fever up to 103F.  Presented again to [facility] ER and found to have large rt pleural effusion.  PCCM consulted to assess pleural effusion.  SIGNIFICANT EVENTS: 9/09 - unsuccessful percutaneous nephrostomy tube exchange 9/10 - urology consulted, placement of Rt ureteral double J stent 9/11 - oxygen desaturation noted >> CXR shows ATX 9/12 - d/c home 9/15 - present to [facility] ED with dyspnea >> Large Rt pleural effusion, PCCM consulted 9/16 - unable to perform thora, not enough fluid per IR, TCTS consulted for loculated Rt pleural effusion 9/21 - vaginal delivery baby boy ("[name]")  STUDIES:  9/16 CT chest >> mod Rt pleural effusion partially loculated, consolidation with air bronchograms RML and RLL  LINES / TUBES: 10/30/12 Rt nephrostomy tube >> out  CULTURES: Blood 9/15 >> GPC in clusters >>1/2 coag neg staph  ANTIBIOTICS: Rocephin 9/09 >> 9/11 Clindamycin 9/09 >> 9/09 Keflex 9/11 >> 9/15 Zosyn 9/15 >> Vancomycin 9/15 >>D/C 9/25    SUBJECTIVE: SOme pain this am.  Fentanyl does not work well per pt.    VITAL SIGNS: Temp:  [98.2 F (36.8 C)-99.4 F (37.4 C)] 99 F (37.2 C) (09/26 0742) Pulse Rate:  [59-118] 99 (09/26 0742) Resp:  [16-31] 19 (09/26 0742) BP: (132-167)/(77-110) 143/93 mmHg (09/26 0742) SpO2:  [90 %-98 %] 91 % (09/26 0742) FiO2 (%):  [0 %] 0 % (09/25 0953) [place] oxygen  PHYSICAL EXAMINATION: General: no distress, c/o pain Neuro:  Alert, normal strength, moves all  extremities HEENT:  No sinus tenderness Cardiovascular:  s1s2 regular Lungs: decreased BS on R. Two CTs in place  Abdomen:  Post partum, non tender, hypoactive bs, fundus firm  Musculoskeletal: 2+ pitting edema  Skin:  Multiple tatoos, no rashes  Labs: CBC Recent Labs     01/09/13  0435  01/09/13  1010  01/10/13  0400  WBC  15.1*  16.4*  17.5*  HGB  6.8*  8.5*  8.4*  HCT  20.7*  26.4*  26.6*  PLT  518*  527*  505*   BMET Recent Labs     01/09/13  0435  01/09/13  1600  01/10/13  0400  NA  139  138  136  K  4.4  3.3*  4.0  CL  108  110  101  CO2  22  18*  23  BUN  20  16  19   CREATININE  2.36*  2.03*  2.62*  GLUCOSE  83  91  79   Electrolytes Recent Labs     01/09/13  0435  01/09/13  1600  01/10/13  0400  CALCIUM  7.5*  6.3*  8.2*    Sepsis Markers No results found for this basename: LACTICACIDVEN, PROCALCITON, O2SATVEN,  in the last 72 hours Liver Enzymes Recent Labs     01/08/13  0455  01/10/13  0400  AST  10  10  ALT  7  7  ALKPHOS  117  119*  BILITOT  <0.1*  0.1*  ALBUMIN  1.8*  1.8*  Imaging CXR 9/26:  Improved aeration. Two CTs in place     ASSESSMENT / PLAN:  PULMONARY: A: PNA >> most likely from aspiration. S/p VATS with empyema 9/24 P: -pulm toilet -IS -oxygen titration   CARDIAC A: SIRS/sepsis >> improved. P: -monitor hemodynamics  RENAL A: Rt hydronephrosis of pregnancy s/p Rt ureteral stent Acute renal insufficiency 9/18 >> ? From vancomycin>>Cr trending down slowly P: -monitor renal fx, urine outpt, electrolytes daily -urology following (Dalsthedt) - spoke with urology to f/u post op>>he states would pull stent in the office after d/c to home   HEMATOLOGY A:  Leukocytosis >> likely related to infection Anemia of pregnancy. P: -f/u CBC  -SCD for DVT prevention  GASTROENTEROLOGY A: Reflux in pregnancy. Constipation P: -protonix  -tums as needed -Miralax prn  OBSTETRICS A: 35 week pregnancy. S/p  delivery AM 9/21 P: -per OB -[facility]aby [facility] at [facility] for bili-lights with increased jaundice -6 week post partum appt made   INFECTION A: Aspiration pneumonia. GPC in cluster in one of two blood cultures >> likely contaminate; low concern for MRSA. All c/s neg so far P: -Zosyn as above -monitor cultures post VATS -d/c'd  vanc with increased Scr  ENDOCRINE A: No acute issues. P: -Monitor blood sugar on BMET  NEUROLOGY A: Pain control. Better with dilaudid pca P: Dilaudid pca  Will Keep on PCCM primary SVC . I apprec Dr Hendrickson's help   Dorcas Carrow Beeper  [phone]  Cell  [phone]  If no response or cell goes to voicemail, call beeper [phone]  01/10/2013  9:44 AM

## 2013-01-11 ENCOUNTER — Inpatient Hospital Stay (HOSPITAL_COMMUNITY): Payer: Medicaid Other

## 2013-01-11 LAB — CBC
HCT: 24.3 % — ABNORMAL LOW (ref 36.0–46.0)
Hemoglobin: 7.9 g/dL — ABNORMAL LOW (ref 12.0–15.0)
MCH: 24.8 pg — ABNORMAL LOW (ref 26.0–34.0)
MCHC: 32.5 g/dL (ref 30.0–36.0)
Platelets: 503 10*3/uL — ABNORMAL HIGH (ref 150–400)
RDW: 14.6 % (ref 11.5–15.5)

## 2013-01-11 LAB — BASIC METABOLIC PANEL
BUN: 19 mg/dL (ref 6–23)
CO2: 24 mEq/L (ref 19–32)
Calcium: 8.7 mg/dL (ref 8.4–10.5)
Chloride: 102 mEq/L (ref 96–112)
Creatinine, Ser: 2.42 mg/dL — ABNORMAL HIGH (ref 0.50–1.10)
GFR calc Af Amer: 31 mL/min — ABNORMAL LOW (ref >90)

## 2013-01-11 MED ORDER — BISACODYL 5 MG PO TBEC: 10.0000 mg | DELAYED_RELEASE_TABLET | Freq: Every day | ORAL | Status: DC

## 2013-01-11 MED ORDER — LEVOFLOXACIN 250 MG PO TABS: 500.0000 mg | ORAL_TABLET | Freq: Every day | ORAL | Status: DC

## 2013-01-11 MED ORDER — CALCIUM CARBONATE ANTACID 500 MG PO CHEW: 1.0000 | CHEWABLE_TABLET | Freq: Every day | ORAL | Status: DC | PRN

## 2013-01-11 MED ORDER — LEVOFLOXACIN 250 MG PO TABS: 250.0000 mg | ORAL_TABLET | Freq: Every day | ORAL | Status: DC

## 2013-01-11 MED ORDER — OXYCODONE HCL 5 MG PO TABS: 5.0000 mg | ORAL_TABLET | ORAL | Status: DC | PRN

## 2013-01-11 MED ORDER — LEVOFLOXACIN 500 MG PO TABS
500.0000 mg | ORAL_TABLET | Freq: Once | ORAL | Status: AC
  Administered 2013-01-11: 500 mg via ORAL
  Filled 2013-01-11: qty 1

## 2013-01-11 MED ORDER — LEVOFLOXACIN 500 MG PO TABS: 500.0000 mg | ORAL_TABLET | Freq: Every day | ORAL | Status: DC

## 2013-01-11 NOTE — Discharge Summary (Signed)
Physician Discharge Summary     Patient ID: CASHE GATT MRN: [id number] DOB/AGE: July 30, 1990 22 y.o.  Admit date: 12/30/2012 Discharge date: 01/11/2013  Discharge Diagnoses:  Pneumonia (aspiration)  Empyema s/p VATs Resolved sepsis  Rt hydronephrosis of pregnancy s/p Rt ureteral stent  Acute renal insufficiency 9/18 >> ? From vancomycin>>Cr trending down slowly Leukocytosis >> likely related to infection  Anemia of pregnancy. Leukocytosis >> likely related to infection  Anemia of pregnancy. Detailed Hospital Course:   22 y/o female in 34th week of pregnancy had Rt hydronephrosis s/p percutaneous nephrostomy tube. She had attempted tube exchange on 9/09 >> complicated by contrast extravasation. Developed progressive dyspnea, Rt pleuritic chest pain, hypoxia, and fever up to 103F. Presented again to [facility] ER and found to have large rt pleural effusion. PCCM consulted to assess pleural effusion. Place on empiric antibiotics. Dx thora unable to be performed due to loculation. Thoracic surgery called for concern re: empyema. She was treated w/ empiric abx, and Ultimately underwent early induced delivery of baby on 9/21, and after recovery was brought to [facility] for [facility] on 9/25. Post op course was unremarkable. CTs all removed 9/27 w/ folllow up CXR showing residual atx but continued improvement.  Patient anxious to get home to new born so after d/w thoracic surgery she was cleared and discharged to home on 9/27.     Discharge Plan by diagnoses  PNA >> most likely from aspiration.  S/p VATS for empyema 9/24  P:  -mobilize -6 more d abx (levaquin) -f/u w thoracic surgery, no PCCM f/u needed --. She will call Monday   Rt hydronephrosis of pregnancy s/p Rt ureteral stent  Acute renal insufficiency 9/18 >> ? From vancomycin>>Cr trending down slowly  P:  -f/u urology , she will have stent removed in out pt office -->she will call Monday   Anemia of pregnancy.  P:  -f/u CBC   Reflux in  pregnancy.  Constipation  P:  -protonix  -tums as needed  -Miralax prn  35 week pregnancy. S/p delivery AM 9/21  P:  -per OB  -baby staying at [facility] for bili-lights with increased jaundice  -6 week post partum appt made   Significant Hospital tests/ studies/ interventions and procedures   Consults LINES / TUBES:  10/30/12 Rt nephrostomy tube >> out   CULTURES:  Blood 9/15 >> GPC in clusters >>1/2 coag neg staph  9/24 Pleural tissue>> 9/24 Pleural fluid>> 9/15 blood>> 1/2 CONS - probable contaminant 9/10 urine>> neg  ANTIBIOTICS:  Rocephin 9/09 >> 9/11  Clindamycin 9/09 >> 9/09  Keflex 9/11 >> 9/15  Zosyn 9/15 >> 9/27 Vancomycin 9/15 >>D/C 9/25  levaquin 0/27>>>  SIGNIFICANT EVENTS:  9/09 - unsuccessful percutaneous nephrostomy tube exchange  9/10 - urology consulted, placement of Rt ureteral double J stent  9/11 - oxygen desaturation noted >> CXR shows ATX  9/12 - d/c home  9/15 - present to [facility] ED with dyspnea >> Large Rt pleural effusion, PCCM consulted  9/16 - unable to perform thora, not enough fluid per IR, TCTS consulted for loculated Rt pleural effusion  9/21 - vaginal delivery baby boy ("[name]")  9/25- VATS  STUDIES:  9/16 CT chest >> mod Rt pleural effusion partially loculated, consolidation with air bronchograms RML and RLL  Discharge Exam: BP 168/104  Pulse 139  Temp(Src) 99.4 F (37.4 C) (Oral)  Resp 28  Ht 5\' 2"  (1.575 m)  Wt 98.629 kg (217 lb 7 oz)  BMI 39.76 kg/m2  SpO2 95%  LMP 05/13/2012  Breastfeeding? Unknown Room air  General: no distress,  Neuro: Alert, normal strength, moves all extremities  HEENT: No sinus tenderness  Cardiovascular: s1s2 regular  Lungs: decreased BS on R. Dressing intact  Abdomen: Post partum, non tender, hypoactive bs,  Musculoskeletal: 1+ pitting edema  Skin: Multiple tatoos, no rashes   Labs at discharge Lab Results  Component Value Date   CREATININE 2.42* 01/11/2013   BUN 19 01/11/2013   NA 137  01/11/2013   K 3.9 01/11/2013   CL 102 01/11/2013   CO2 24 01/11/2013   Lab Results  Component Value Date   WBC 16.1* 01/11/2013   HGB 7.9* 01/11/2013   HCT 24.3* 01/11/2013   MCV 76.4* 01/11/2013   PLT 503* 01/11/2013   Lab Results  Component Value Date   ALT 7 01/10/2013   AST 10 01/10/2013   ALKPHOS 119* 01/10/2013   BILITOT 0.1* 01/10/2013   Lab Results  Component Value Date   INR 0.99 01/08/2013   INR 0.98 10/30/2012    Current radiology studies Dg Chest Port 1 View  01/11/2013   ADDENDUM REPORT: 01/11/2013 15:50  ADDENDUM: Both chest tubes have been now been removed. EKG leads are seen overlying the right hemi thorax.   Electronically Signed   By: Myles Rosenthal   On: 01/11/2013 15:50   01/11/2013   CLINICAL DATA:  Postop from pleural decortication. Chest tube removal.  EXAM: PORTABLE CHEST - 1 VIEW  COMPARISON:  01/11/2013  FINDINGS: One right chest tube is been removed in 1 remains in place. No definite pneumothorax visualized. Increased atelectasis noted in the right lung base. Small right pleural effusion appears mildly increased in size. Left lung remains clear. Heart size is within normal limits.  IMPRESSION: Mildly increased right basilar atelectasis and probable small right pleural effusion following chest tube removal. No pneumothorax identified.  Electronically Signed: By: Myles Rosenthal On: 01/11/2013 15:33   Dg Chest Port 1 View  01/11/2013   CLINICAL DATA:  Postop from pleural decortication.  EXAM: PORTABLE CHEST - 1 VIEW  COMPARISON:  01/10/2013  FINDINGS: Two right chest tubes and right IJ central venous catheter remain in appropriate position. No pneumothorax visualized. Right lung volume loss and atelectasis show no significant change. There is minimal atelectasis at the left lung base which is unchanged. No new or worsening areas of pulmonary opacity are seen. Heart size remains stable.  IMPRESSION: Persistent right lung volume loss and atelectasis. No pneumothorax visualized.    Electronically Signed   By: Myles Rosenthal   On: 01/11/2013 08:02   Dg Chest Port 1 View  01/10/2013   CLINICAL DATA:  Post chest decortication  EXAM: PORTABLE CHEST - 1 VIEW  COMPARISON:  01/09/2013  FINDINGS: Two chest tubes remain on the right. No pneumothorax is identified. Slightly improved aeration on the right with persistent airspace disease and pleural thickening.  Improved aeration left lower lobe with mild residual atelectasis and effusion. No heart failure.  Central venous catheter tip remains in the cavoatrial junction.  IMPRESSION: Negative for pneumothorax. Mild improvement in aeration of the lungs bilaterally.   Electronically Signed   By: Marlan Palau M.D.   On: 01/10/2013 07:27    Disposition:  01-Home or Self Care      Discharge Orders   Future Orders Complete By Expires   Diet - low sodium heart healthy  As directed    Discharge instructions  As directed    Comments:  May shower Now lifting heavier than baby  Dry dressing to chest tube site Call for fever or new drainage   Increase activity slowly  As directed        Medication List    STOP taking these medications       diphenhydrAMINE 25 mg capsule  Commonly known as:  BENADRYL     oxyCODONE-acetaminophen 10-325 MG per tablet  Commonly known as:  PERCOCET      TAKE these medications       bisacodyl 5 MG EC tablet  Commonly known as:  DULCOLAX  Take 2 tablets (10 mg total) by mouth daily.     calcium carbonate 500 MG chewable tablet  Commonly known as:  TUMS - dosed in mg elemental calcium  Chew 1 tablet (200 mg of elemental calcium total) by mouth daily as needed.     docusate sodium 100 MG capsule  Commonly known as:  COLACE  Take 100 mg by mouth 3 (three) times daily as needed for constipation.     levofloxacin 250 MG tablet  Commonly known as:  LEVAQUIN  Take 2 tablets (500 mg total) by mouth daily.  Start taking on:  01/12/2013     ondansetron 8 MG tablet  Commonly known as:  ZOFRAN    Take 8 mg by mouth every 8 (eight) hours as needed for nausea.     oxybutynin 5 MG tablet  Commonly known as:  DITROPAN  Take 5 mg by mouth 3 (three) times daily.     oxyCODONE 5 MG immediate release tablet  Commonly known as:  Oxy IR/ROXICODONE  Take 1-2 tablets (5-10 mg total) by mouth every 4 (four) hours as needed.     pantoprazole 40 MG tablet  Commonly known as:  PROTONIX  Take 40 mg by mouth daily.     ranitidine 75 MG tablet  Commonly known as:  ZANTAC  Take 75 mg by mouth daily.      ASK your doctor about these medications       cephALEXin 500 MG capsule  Commonly known as:  KEFLEX  Take 500 mg by mouth 3 (three) times daily.       Follow-up Information   Follow up with MEISINGER,TODD D, MD On 02/17/2013. (10:00am )    Specialty:  Obstetrics and Gynecology   Contact information:   [address], [address] [place] [address] [phone]       Schedule an appointment as soon as possible for a visit with Chelsea Aus, MD. (call monday )    Specialty:  Urology   Contact information:   [address] [place] [address] [phone]       Follow up with Loreli Slot, MD. (call monday for appointment )    Specialty:  Cardiothoracic Surgery   Contact information:   [address] [place] [address] [phone]       Follow up with Patricia Pesa, MD In 1 week. (follow up for kidney function )    Specialty:  Obstetrics and Gynecology      Discharged Condition: good  Physician Statement:   The Patient was personally examined, the discharge assessment and plan has been personally reviewed and I agree with ACNP Teng Decou's assessment and plan. > 30 minutes of time have been dedicated to discharge assessment, planning and discharge instructions.   SignedAnders Simmonds 01/11/2013, 4:49 PM

## 2013-01-11 NOTE — Progress Notes (Signed)
PULMONARY  / CRITICAL CARE MEDICINE  Name: [name] [name] MRN: [id number] DOB: 10-05-1990    ADMISSION DATE:  12/30/2012 CONSULTATION DATE:  12/30/2012  REFERRING MD :  Tawanna Cooler Meisinger  CHIEF COMPLAINT: Short of breath  BRIEF PATIENT DESCRIPTION:  22 y/o female in 34th week of pregnancy had Rt hydronephrosis s/p percutaneous nephrostomy tube.  She had attempted tube exchange on 9/09 >> complicated by contrast extravasation.  Developed progressive dyspnea, Rt pleuritic chest pain, hypoxia, and fever up to 103F.  Presented again to [facility] ER and found to have large rt pleural effusion.  PCCM consulted to assess pleural effusion.  SIGNIFICANT EVENTS: 9/09 - unsuccessful percutaneous nephrostomy tube exchange 9/10 - urology consulted, placement of Rt ureteral double J stent 9/11 - oxygen desaturation noted >> CXR shows ATX 9/12 - d/c home 9/15 - present to [facility] ED with dyspnea >> Large Rt pleural effusion, PCCM consulted 9/16 - unable to perform thora, not enough fluid per IR, TCTS consulted for loculated Rt pleural effusion 9/21 - vaginal delivery baby boy ("[name]")  STUDIES:  9/16 CT chest >> mod Rt pleural effusion partially loculated, consolidation with air bronchograms RML and RLL  LINES / TUBES: 10/30/12 Rt nephrostomy tube >> out  CULTURES: Blood 9/15 >> GPC in clusters >>1/2 coag neg staph  ANTIBIOTICS: Rocephin 9/09 >> 9/11 Clindamycin 9/09 >> 9/09 Keflex 9/11 >> 9/15 Zosyn 9/15 >> Vancomycin 9/15 >>D/C 9/25    SUBJECTIVE: No report pain. Eager to get home to baby.Nurse reports no special needs   VITAL SIGNS: Temp:  [98.2 F (36.8 C)-99.1 F (37.3 C)] 99.1 F (37.3 C) (09/27 0800) Pulse Rate:  [90-118] 90 (09/27 0805) Resp:  [15-24] 19 (09/27 0805) BP: (157-171)/(100-109) 162/108 mmHg (09/27 0805) SpO2:  [90 %-99 %] 99 % (09/27 0805) [place] oxygen  PHYSICAL EXAMINATION: General: no distress,  Neuro:  Alert, normal strength, moves all extremities HEENT:  No sinus  tenderness Cardiovascular:  s1s2 regular Lungs: decreased BS on R. Two CTs in place  Abdomen:  Post partum, non tender, hypoactive bs,  Musculoskeletal: 1+ pitting edema  Skin:  Multiple tatoos, no rashes  Labs: CBC Recent Labs     01/09/13  1010  01/10/13  0400  01/11/13  0500  WBC  16.4*  17.5*  16.1*  HGB  8.5*  8.4*  7.9*  HCT  26.4*  26.6*  24.3*  PLT  527*  505*  503*   BMET Recent Labs     01/09/13  1600  01/10/13  0400  01/11/13  0500  NA  138  136  137  K  3.3*  4.0  3.9  CL  110  101  102  CO2  18*  23  24  BUN  16  19  19   CREATININE  2.03*  2.62*  2.42*  GLUCOSE  91  79  101*   Electrolytes Recent Labs     01/09/13  1600  01/10/13  0400  01/11/13  0500  CALCIUM  6.3*  8.2*  8.7    Sepsis Markers No results found for this basename: LACTICACIDVEN, PROCALCITON, O2SATVEN,  in the last 72 hours Liver Enzymes Recent Labs     01/10/13  0400  AST  10  ALT  7  ALKPHOS  119*  BILITOT  0.1*  ALBUMIN  1.8*    Imaging CXR 9/27: Still 2 R chest tubes, R IJ line, no ptx. Persistent R volume loss.  ASSESSMENT / PLAN:  PULMONARY: A: PNA >>  most likely from aspiration. S/p VATS with empyema 9/24 P: -pulm toilet -IS -oxygen titration  -Should improve as tubes out and she can mobilize  CARDIAC A: SIRS/sepsis >> improved. P: -monitor hemodynamics  RENAL A: Rt hydronephrosis of pregnancy s/p Rt ureteral stent Acute renal insufficiency 9/18 >> ? From vancomycin>>Cr trending down slowly P: -monitor renal fx, urine outpt, electrolytes daily -urology following (Dalsthedt) - spoke with urology to f/u post op>>he states would pull stent in the office after d/c to home. Nurse indicates Urology plans to send home w/ foley in place. That can be clarified.  - Creat 9/26 2.62  - for recheck  HEMATOLOGY A:  Leukocytosis >> likely related to infection Anemia of pregnancy. P: -f/u CBC  -SCD for DVT prevention  GASTROENTEROLOGY A: Reflux in  pregnancy. Constipation P: -protonix  -tums as needed -Miralax prn  OBSTETRICS A: 35 week pregnancy. S/p delivery AM 9/21 P: -per OB -baby staying at [facility] for bili-lights with increased jaundice -6 week post partum appt made   INFECTION A: Aspiration pneumonia. GPC in cluster in one of two blood cultures >> likely contaminate; low concern for MRSA. All c/s neg so far P: -Zosyn as above -monitor cultures post VATS -d/c'd  vanc with increased Scr  ENDOCRINE A: No acute issues. P: -Monitor blood sugar on BMET  NEUROLOGY A: Pain control. Better with dilaudid pca P: Dilaudid pca  Will Keep on PCCM primary SVC . I apprec Dr Hendrickson's help   [facility] DMD Beeper  [phone], m- [phone]  If no response or cell goes to voicemail, call beeper [phone]  01/11/2013  10:22 AM

## 2013-01-11 NOTE — Progress Notes (Addendum)
      [address]       [place] [address]             [phone]      3 Days Post-Op Procedure(s) (LRB): VIDEO ASSISTED THORACOSCOPY DECORTICATION; Drainage of Pleural Effusion (Right)  Subjective:  [name] [name] has no complaints this morning.  She states she wants to go home ASAP.  She has a 6 day old infant at home.  Objective: Vital signs in last 24 hours: Temp:  [98.2 F (36.8 C)-99.1 F (37.3 C)] 99.1 F (37.3 C) (09/27 0800) Pulse Rate:  [90-118] 90 (09/27 0805) Cardiac Rhythm:  [-] Sinus tachycardia (09/27 0800) Resp:  [15-24] 19 (09/27 0805) BP: (157-171)/(100-109) 162/108 mmHg (09/27 0805) SpO2:  [90 %-99 %] 99 % (09/27 0805)  Intake/Output from previous day: 09/26 0701 - 09/27 0700 In: 1980 [P.O.:1320; I.V.:560; IV Piggyback:100] Out: 8105 [Urine:8075; Chest Tube:30] Intake/Output this shift: Total I/O In: 20 [I.V.:20] Out: -   General appearance: alert, cooperative and no distress Heart: regular rate and rhythm Lungs: clear to auscultation bilaterally Abdomen: soft, non-tender; bowel sounds normal; no masses,  no organomegaly Wound: clean and dry   Lab Results:  Recent Labs  01/10/13 0400 01/11/13 0500  WBC 17.5* 16.1*  HGB 8.4* 7.9*  HCT 26.6* 24.3*  PLT 505* 503*   BMET:  Recent Labs  01/10/13 0400 01/11/13 0500  NA 136 137  K 4.0 3.9  CL 101 102  CO2 23 24  GLUCOSE 79 101*  BUN 19 19  CREATININE 2.62* 2.42*  CALCIUM 8.2* 8.7    PT/INR: No results found for this basename: LABPROT, INR,  in the last 72 hours ABG    Component Value Date/Time   PHART 7.400 01/09/2013 0437   HCO3 22.4 01/09/2013 0437   TCO2 23 01/09/2013 0437   ACIDBASEDEF 2.0 01/09/2013 0437   O2SAT 97.0 01/09/2013 0437   CBG (last 3)  No results found for this basename: GLUCAP,  in the last 72 hours  Assessment/Plan: S/P Procedure(s) (LRB): VIDEO ASSISTED THORACOSCOPY DECORTICATION; Drainage of Pleural Effusion (Right)  1. Chest tube- 30 cc  output yesterday, no air leak present. CXR stable continued atelectasis on Right 2. CV- Hypertensive- management per primary 3. Dispo- patient very anxious to go home, will discuss tube removal with staff   LOS: 12 days    [name], [name] 01/11/2013  DC chest tubes Cont ambulation and incentive spirometry  patient examined and medical record reviewed,agree with above note. VAN TRIGT [name],[name] 01/11/2013

## 2013-01-12 LAB — BODY FLUID CULTURE

## 2013-01-12 LAB — TISSUE CULTURE
Culture: NO GROWTH
Gram Stain: NONE SEEN

## 2013-01-13 NOTE — Care Management Note (Signed)
    Page 1 of 1   01/13/2013     11:31:15 AM   CARE MANAGEMENT NOTE 01/13/2013  Patient:  [name] [name], [name] [name]   Account Number:  [id number]  Date Initiated:  01/10/2013  Documentation initiated by:  Donn Pierini  Subjective/Objective Assessment:   Pt admitted to [facility] - present to [facility] ED with dyspnea >> Large Rt pleural effusion, PCCM consulted   TCTS consulted for loculated Rt pleural effusion- 9/21 - vaginal delivery baby- tx to [facility] for VATS on 01/07/13     Action/Plan:   PTA pt lived at home with SO, plan to d/c home to family and newborn   Anticipated DC Date:  01/12/2013   Anticipated DC Plan:  HOME/SELF CARE         Choice offered to / List presented to:             Status of service:  Completed, signed off Medicare Important Message given?   (If response is "NO", the following Medicare IM given date fields will be blank) Date Medicare IM given:   Date Additional Medicare IM given:    Discharge Disposition:  HOME/SELF CARE  Per UR Regulation:  Reviewed for med. necessity/level of care/duration of stay  If discussed at Long Length of Stay Meetings, dates discussed:    Comments:  01/10/13- 1200- Donn Pierini RN, BSN [phone] Pt s/o VATS on 01/08/13  -Her CT output has decreased- will place to water seal- hopefully can remove one or both tubes in AM- She is anxious to get home to her newborn Creatinine up today- may be related to stent issues, but will check for urinary eosinophils since she is on zosyn Hopefully can get home Sunday

## 2013-01-21 ENCOUNTER — Encounter (HOSPITAL_COMMUNITY): Payer: Self-pay | Admitting: *Deleted

## 2013-01-24 ENCOUNTER — Other Ambulatory Visit: Payer: Self-pay | Admitting: *Deleted

## 2013-01-24 DIAGNOSIS — J90 Pleural effusion, not elsewhere classified: Secondary | ICD-10-CM

## 2013-01-28 ENCOUNTER — Encounter: Payer: Self-pay | Admitting: Thoracic Surgery (Cardiothoracic Vascular Surgery)

## 2013-01-28 ENCOUNTER — Ambulatory Visit
Admission: RE | Admit: 2013-01-28 | Discharge: 2013-01-28 | Disposition: A | Discharge status: Home / Self Care | Payer: Medicaid Other | Source: Ambulatory Visit | Attending: Thoracic Surgery (Cardiothoracic Vascular Surgery) | Admitting: Thoracic Surgery (Cardiothoracic Vascular Surgery)

## 2013-01-28 ENCOUNTER — Ambulatory Visit (INDEPENDENT_AMBULATORY_CARE_PROVIDER_SITE_OTHER): Payer: Self-pay | Admitting: Thoracic Surgery (Cardiothoracic Vascular Surgery)

## 2013-01-28 VITALS — BP 135/90 | HR 102 | Resp 20 | Ht 62.0 in | Wt 215.0 lb

## 2013-01-28 DIAGNOSIS — J90 Pleural effusion, not elsewhere classified: Secondary | ICD-10-CM

## 2013-01-28 NOTE — Patient Instructions (Signed)
No restrictions, but build into new activities gradually

## 2013-01-28 NOTE — Progress Notes (Signed)
HPI:  [name] [name] returns today for a scheduled postoperative followup visit.  She is a 22 year old woman who developed a right pleural effusion back in September during her 35th week of pregnancy. The baby was delivered and she then had a right VATS and decortication on September 24.  Her postoperative course after decortication was uncomplicated.  She says that she has been experiencing some anxiety and decreased energy levels. She does have some incisional pain. She is out of her oxycodone. She does feel that she's getting better. She just is under a lot of stress with a new baby and recovering from her medical issues.  Past Medical History  Diagnosis Date  . Anxiety   . Chronic pain     muscle spasms- entire body  . Infection     UTI  . Fracture, ankle     left- x2  . Hydronephrosis   . Bipolar 1 disorder   . Post traumatic stress disorder (PTSD)   . Depression     doing fine now  . OCD (obsessive compulsive disorder)       Current Outpatient Prescriptions  Medication Sig Dispense Refill  . ALPRAZolam (XANAX) 0.5 MG tablet Take 0.5 mg by mouth at bedtime as needed for sleep.      . hydrochlorothiazide (HYDRODIURIL) 25 MG tablet Take 25 mg by mouth daily.      . pantoprazole (PROTONIX) 40 MG tablet Take 40 mg by mouth daily.       No current facility-administered medications for this visit.    Physical Exam BP 135/90  Pulse 102  Resp 20  Ht 5\' 2"  (1.575 m)  Wt 215 lb (97.523 kg)  BMI 39.31 kg/m2  SpO46 27% 22 year old woman in no acute distress Incision well-healed Chest tube sites with minimal irritation from sutures, sutures removed, wounds intact  Diagnostic Tests: Chest x-ray 01/29/1940 EXAM:  CHEST 2 VIEW  COMPARISON: 01/11/2013  FINDINGS:  Blunting of the right costophrenic angle noted with linear pulmonary  opacities in the adjacent right middle lobe and right lower lobe.  Left lung clear. Cardiac and mediastinal margins appear normal.  IMPRESSION:   Small right pleural effusion with adjacent subsegmental atelectasis.  Significant improvement from 01/11/2013.  Electronically Signed  By: Herbie Baltimore M.D.  On: 01/28/2013 10:00  Impression: 22 year old woman who had a cough a right pleural effusion near the end of her pregnancy. She was able to successfully deliver the baby and then had a right VATS and decortication. She had a lot of questions regarding her effusion and other issues while in the hospital.  She had grown coag negative staph from one of 2 blood cultures. I explained to her that this likely was a skin contaminant and not a true infection. The cultures from her pleural fluid and debris were negative.  Is not entirely clear why she developed a pleural effusion. She may have had a pneumonia or it may have been related to the manipulation of the percutaneous nephrostomy tube on that side a few days prior to her admission.  From a surgical standpoint she is healing well. There are no restrictions on her activities at this time. She should build into new activities gradually.  I did give her a prescription for an additional 50 oxycodone 5 mg tablets one to 3 times daily as needed for pain. After that she should be able to Tylenol.  Plan: I will be happy to see her back any time if I can be of any further assistance  with her care

## 2013-02-05 LAB — FUNGUS CULTURE W SMEAR: Fungal Smear: NONE SEEN

## 2013-02-20 LAB — AFB CULTURE WITH SMEAR (NOT AT ARMC)

## 2013-03-21 ENCOUNTER — Encounter (HOSPITAL_COMMUNITY): Payer: Self-pay | Admitting: Pharmacist

## 2013-03-30 NOTE — H&P (Signed)
[name] [name] is an 22 y.o. female. She is s/p induced vacuum assisted delivery at 36 weeks in September in order to facilitate surgery for empyema.  She is interested in permanent steriity, does not want another pregnancy.  All options have been discussed, she is admitted for laparoscopic sterilization.  Pertinent Gynecological History: OB History: G1, P0101   Menstrual History: No LMP recorded.    Past Medical History  Diagnosis Date  . Anxiety   . Chronic pain     muscle spasms- entire body  . Infection     UTI  . Fracture, ankle     left- x2  . Hydronephrosis   . Bipolar 1 disorder   . Post traumatic stress disorder (PTSD)   . Depression     doing fine now  . OCD (obsessive compulsive disorder)     Past Surgical History  Procedure Laterality Date  . No past surgeries    . Nephrostomy tube placement at 25wks and removed at 335/7 Right   . Cystoscopy with stent placement Right 12/25/2012    Procedure: CYSTOSCOPY WITH STENT PLACEMENT;  Surgeon: Marcine Matar, MD;  Location: [facility] ORS;  Service: Urology;  Laterality: Right;  . Video assisted thoracoscopy (vats)/decortication Right 01/08/2013    Procedure: VIDEO ASSISTED THORACOSCOPY DECORTICATION; Drainage of Pleural Effusion;  Surgeon: Loreli Slot, MD;  Location: [facility] OR;  Service: Thoracic;  Laterality: Right;    Family History  Problem Relation Age of Onset  . Heart disease Mother   . Hypertension Father   . Cancer Paternal Grandmother     lung  . Heart disease Paternal Grandmother     Social History:  reports that she has been smoking Cigarettes.  She has been smoking about 0.25 packs per day. She has never used smokeless tobacco. She reports that she drinks alcohol. She reports that she does not use illicit drugs.  Allergies:  Allergies  Allergen Reactions  . Cinnamon Anaphylaxis and Hives  . Shellfish Allergy Anaphylaxis  . Augmentin [Amoxicillin-Pot Clavulanate] Nausea And Vomiting  . Nickel Rash     No prescriptions prior to admission    Review of Systems  Respiratory: Negative.   Cardiovascular: Negative.   Gastrointestinal: Negative.   Genitourinary: Negative.     There were no vitals taken for this visit. Physical Exam  Constitutional: She appears well-developed and well-nourished.  Cardiovascular: Normal rate, regular rhythm and normal heart sounds.   No murmur heard. Respiratory: Effort normal and breath sounds normal. No respiratory distress.  GI: Soft. She exhibits no distension and no mass. There is no tenderness.  Genitourinary: Vagina normal.  Uterus normal size and shape No adnexal mass    No results found for this or any previous visit (from the past 24 hour(s)).  No results found.  Assessment/Plan: Desires permanent sterility. Discussed that this is a permanent procedure, there are other long term options that are reversible.  The procedure, risks, alternatives and failure rate have all been discussed.  Will admit for laparoscopic bilateral salpingectomy.  [name] [name] D 03/30/2013, 7:41 PM

## 2013-03-31 ENCOUNTER — Ambulatory Visit (HOSPITAL_COMMUNITY)
Admission: RE | Admit: 2013-03-31 | Discharge: 2013-03-31 | Disposition: A | Discharge status: Home / Self Care | Payer: Medicaid Other | Source: Ambulatory Visit | Attending: Obstetrics and Gynecology | Admitting: Obstetrics and Gynecology

## 2013-03-31 ENCOUNTER — Encounter (HOSPITAL_COMMUNITY): Payer: Self-pay | Admitting: Anesthesiology

## 2013-03-31 ENCOUNTER — Encounter (HOSPITAL_COMMUNITY): Payer: Medicaid Other | Admitting: Anesthesiology

## 2013-03-31 ENCOUNTER — Encounter (HOSPITAL_COMMUNITY)
Admission: RE | Disposition: A | Discharge status: Home / Self Care | Payer: Self-pay | Source: Ambulatory Visit | Attending: Obstetrics and Gynecology

## 2013-03-31 ENCOUNTER — Ambulatory Visit (HOSPITAL_COMMUNITY): Payer: Medicaid Other | Admitting: Anesthesiology

## 2013-03-31 DIAGNOSIS — Z302 Encounter for sterilization: Secondary | ICD-10-CM | POA: Diagnosis not present

## 2013-03-31 DIAGNOSIS — Z309 Encounter for contraceptive management, unspecified: Secondary | ICD-10-CM

## 2013-03-31 HISTORY — PX: LAPAROSCOPIC BILATERAL SALPINGECTOMY: SHX5889

## 2013-03-31 LAB — CBC
HCT: 34.6 % — ABNORMAL LOW (ref 36.0–46.0)
MCH: 22.6 pg — ABNORMAL LOW (ref 26.0–34.0)
MCHC: 31.2 g/dL (ref 30.0–36.0)
MCV: 72.5 fL — ABNORMAL LOW (ref 78.0–100.0)
Platelets: 338 10*3/uL (ref 150–400)
RBC: 4.77 MIL/uL (ref 3.87–5.11)
RDW: 15.2 % (ref 11.5–15.5)
WBC: 7.9 10*3/uL (ref 4.0–10.5)

## 2013-03-31 LAB — PREGNANCY, URINE: Preg Test, Ur: NEGATIVE

## 2013-03-31 SURGERY — SALPINGECTOMY, BILATERAL, LAPAROSCOPIC
Anesthesia: General | Laterality: Bilateral

## 2013-03-31 MED ORDER — FENTANYL CITRATE 0.05 MG/ML IJ SOLN
INTRAMUSCULAR | Status: AC
  Filled 2013-03-31: qty 2

## 2013-03-31 MED ORDER — PROPOFOL 10 MG/ML IV BOLUS
INTRAVENOUS | Status: DC | PRN
  Administered 2013-03-31: 180 mg via INTRAVENOUS

## 2013-03-31 MED ORDER — OXYCODONE-ACETAMINOPHEN 5-325 MG PO TABS
ORAL_TABLET | ORAL | Status: AC
  Filled 2013-03-31: qty 1

## 2013-03-31 MED ORDER — LIDOCAINE HCL (CARDIAC) 20 MG/ML IV SOLN
INTRAVENOUS | Status: AC
  Filled 2013-03-31: qty 5

## 2013-03-31 MED ORDER — ACETAMINOPHEN 160 MG/5ML PO SOLN: 1000.0000 mg | Freq: Once | ORAL | Status: DC

## 2013-03-31 MED ORDER — DEXAMETHASONE SODIUM PHOSPHATE 10 MG/ML IJ SOLN
INTRAMUSCULAR | Status: DC | PRN
  Administered 2013-03-31: 10 mg via INTRAVENOUS

## 2013-03-31 MED ORDER — LACTATED RINGERS IV SOLN
INTRAVENOUS | Status: DC
  Administered 2013-03-31 (×2): via INTRAVENOUS

## 2013-03-31 MED ORDER — GLYCOPYRROLATE 0.2 MG/ML IJ SOLN
INTRAMUSCULAR | Status: DC | PRN
  Administered 2013-03-31: 0.6 mg via INTRAVENOUS

## 2013-03-31 MED ORDER — KETOROLAC TROMETHAMINE 30 MG/ML IJ SOLN
INTRAMUSCULAR | Status: DC | PRN
  Administered 2013-03-31 (×2): 30 mg via INTRAVENOUS

## 2013-03-31 MED ORDER — LIDOCAINE HCL (CARDIAC) 20 MG/ML IV SOLN
INTRAVENOUS | Status: DC | PRN
  Administered 2013-03-31: 70 mg via INTRAVENOUS
  Administered 2013-03-31: 30 mg via INTRAVENOUS

## 2013-03-31 MED ORDER — MIDAZOLAM HCL 2 MG/2ML IJ SOLN
INTRAMUSCULAR | Status: AC
  Filled 2013-03-31: qty 2

## 2013-03-31 MED ORDER — LACTATED RINGERS IV SOLN
INTRAVENOUS | Status: DC
  Administered 2013-03-31: 09:00:00 via INTRAVENOUS

## 2013-03-31 MED ORDER — OXYCODONE-ACETAMINOPHEN 5-325 MG PO TABS
1.0000 | ORAL_TABLET | ORAL | Status: DC | PRN
  Administered 2013-03-31: 1 via ORAL

## 2013-03-31 MED ORDER — GLYCOPYRROLATE 0.2 MG/ML IJ SOLN
INTRAMUSCULAR | Status: AC
  Filled 2013-03-31: qty 3

## 2013-03-31 MED ORDER — ROCURONIUM BROMIDE 100 MG/10ML IV SOLN
INTRAVENOUS | Status: DC | PRN
  Administered 2013-03-31: 25 mg via INTRAVENOUS

## 2013-03-31 MED ORDER — ACETAMINOPHEN 160 MG/5ML PO SOLN
ORAL | Status: AC
  Administered 2013-03-31: 950 mg
  Filled 2013-03-31: qty 40.6

## 2013-03-31 MED ORDER — ONDANSETRON HCL 4 MG/2ML IJ SOLN
INTRAMUSCULAR | Status: AC
  Filled 2013-03-31: qty 2

## 2013-03-31 MED ORDER — OXYCODONE-ACETAMINOPHEN 5-325 MG PO TABS: 1.0000 | ORAL_TABLET | ORAL | Status: DC | PRN

## 2013-03-31 MED ORDER — NEOSTIGMINE METHYLSULFATE 1 MG/ML IJ SOLN
INTRAMUSCULAR | Status: DC | PRN
  Administered 2013-03-31: 3 mg via INTRAVENOUS

## 2013-03-31 MED ORDER — BUPIVACAINE HCL (PF) 0.25 % IJ SOLN
INTRAMUSCULAR | Status: AC
  Filled 2013-03-31: qty 30

## 2013-03-31 MED ORDER — BUPIVACAINE HCL (PF) 0.25 % IJ SOLN
INTRAMUSCULAR | Status: DC | PRN
  Administered 2013-03-31: 7 mL

## 2013-03-31 MED ORDER — FENTANYL CITRATE 0.05 MG/ML IJ SOLN
25.0000 ug | INTRAMUSCULAR | Status: AC | PRN
  Administered 2013-03-31 (×6): 50 ug via INTRAVENOUS

## 2013-03-31 MED ORDER — MIDAZOLAM HCL 2 MG/2ML IJ SOLN
INTRAMUSCULAR | Status: DC | PRN
  Administered 2013-03-31: 2 mg via INTRAVENOUS

## 2013-03-31 MED ORDER — FENTANYL CITRATE 0.05 MG/ML IJ SOLN
INTRAMUSCULAR | Status: DC | PRN
  Administered 2013-03-31 (×4): 50 ug via INTRAVENOUS

## 2013-03-31 MED ORDER — PROPOFOL 10 MG/ML IV EMUL
INTRAVENOUS | Status: AC
  Filled 2013-03-31: qty 20

## 2013-03-31 MED ORDER — FENTANYL CITRATE 0.05 MG/ML IJ SOLN
50.0000 ug | Freq: Once | INTRAMUSCULAR | Status: AC
  Administered 2013-03-31: 50 ug via INTRAVENOUS

## 2013-03-31 MED ORDER — DEXAMETHASONE SODIUM PHOSPHATE 10 MG/ML IJ SOLN
INTRAMUSCULAR | Status: AC
  Filled 2013-03-31: qty 2

## 2013-03-31 MED ORDER — ONDANSETRON HCL 4 MG/2ML IJ SOLN
INTRAMUSCULAR | Status: DC | PRN
  Administered 2013-03-31: 4 mg via INTRAVENOUS

## 2013-03-31 MED ORDER — SODIUM CHLORIDE 0.9 % IJ SOLN
INTRAMUSCULAR | Status: AC
  Filled 2013-03-31: qty 10

## 2013-03-31 SURGICAL SUPPLY — 31 items
ADH SKN CLS APL DERMABOND .7 (GAUZE/BANDAGES/DRESSINGS) ×1
BAG SPEC RTRVL LRG 6X4 10 (ENDOMECHANICALS)
CABLE HIGH FREQUENCY MONO STRZ (ELECTRODE) IMPLANT
CATH ROBINSON RED A/P 16FR (CATHETERS) IMPLANT
CHLORAPREP W/TINT 26ML (MISCELLANEOUS) ×2 IMPLANT
CLOTH BEACON ORANGE TIMEOUT ST (SAFETY) ×2 IMPLANT
DECANTER SPIKE VIAL GLASS SM (MISCELLANEOUS) ×2 IMPLANT
DERMABOND ADVANCED (GAUZE/BANDAGES/DRESSINGS) ×1
DERMABOND ADVANCED .7 DNX12 (GAUZE/BANDAGES/DRESSINGS) ×1 IMPLANT
GLOVE BIO SURGEON STRL SZ8 (GLOVE) ×2 IMPLANT
GLOVE ORTHO TXT STRL SZ7.5 (GLOVE) ×2 IMPLANT
GOWN PREVENTION PLUS LG XLONG (DISPOSABLE) ×2 IMPLANT
GOWN PREVENTION PLUS XXLARGE (GOWN DISPOSABLE) ×2 IMPLANT
NEEDLE EPID 17G 5 ECHO TUOHY (NEEDLE) IMPLANT
NEEDLE INSUFFLATION 120MM (ENDOMECHANICALS) ×2 IMPLANT
NS IRRIG 1000ML POUR BTL (IV SOLUTION) ×2 IMPLANT
PACK LAPAROSCOPY BASIN (CUSTOM PROCEDURE TRAY) ×2 IMPLANT
POUCH SPECIMEN RETRIEVAL 10MM (ENDOMECHANICALS) IMPLANT
PROTECTOR NERVE ULNAR (MISCELLANEOUS) ×2 IMPLANT
SCALPEL HARMONIC ACE (MISCELLANEOUS) IMPLANT
SET IRRIG TUBING LAPAROSCOPIC (IRRIGATION / IRRIGATOR) IMPLANT
SOLUTION ELECTROLUBE (MISCELLANEOUS) IMPLANT
SUT VICRYL 0 UR6 27IN ABS (SUTURE) IMPLANT
SUT VICRYL 4-0 PS2 18IN ABS (SUTURE) ×2 IMPLANT
TOWEL OR 17X24 6PK STRL BLUE (TOWEL DISPOSABLE) ×4 IMPLANT
TRAY FOLEY CATH 14FR (SET/KITS/TRAYS/PACK) IMPLANT
TROCAR XCEL NON-BLD 11X100MML (ENDOMECHANICALS) IMPLANT
TROCAR XCEL NON-BLD 5MMX100MML (ENDOMECHANICALS) ×2 IMPLANT
TROCAR XCEL OPT SLVE 5M 100M (ENDOMECHANICALS) IMPLANT
WARMER LAPAROSCOPE (MISCELLANEOUS) ×2 IMPLANT
WATER STERILE IRR 1000ML POUR (IV SOLUTION) ×2 IMPLANT

## 2013-03-31 NOTE — Transfer of Care (Signed)
Immediate Anesthesia Transfer of Care Note  Patient: [name] [name]  Procedure(s) Performed: Procedure(s): LAPAROSCOPIC BILATERAL SALPINGECTOMY (Bilateral)  Patient Location: PACU  Anesthesia Type:General  Level of Consciousness: awake, alert , oriented and patient cooperative  Airway & Oxygen Therapy: Patient Spontanous Breathing and Patient connected to nasal cannula oxygen  Post-op Assessment: Report given to PACU RN and Post -op Vital signs reviewed and stable  Post vital signs: Reviewed and stable  Complications: No apparent anesthesia complications

## 2013-03-31 NOTE — Op Note (Signed)
Preoperative diagnosis: Desires surgical sterility Postoperative diagnosis: Same Procedure: Laparoscopic bilateral salpingectomy Surgeon: Lavina Hamman M.D. Anesthesia: Gen. Endotracheal tube Findings: She had a normal abdomen and pelvis with normal uterus tubes and ovaries Specimens:  Fallopian tubes Estimated blood loss: Minimal Complications: None  Procedure in detail  The patient was taken to the operating room and placed in the dorsosupine position. General anesthesia was induced. Her legs were placed in mobile stirrups and her left arm was tucked to her side. Abdomen perineum and vagina were then prepped and draped in the usual sterile fashion, a Foley catheter was inserted, a Hulka tenaculum was applied to the cervix for uterine manipulation. Infraumbilical skin was then infiltrated with quarter percent Marcaine and a 1 cm vertical incision was made. The veress needle was inserted into the peritoneal cavity and placement confirmed by the water drop test and an opening pressure of 7 mm of mercury. CO2 was insufflated to a pressure of 14 mm of mercury and the veress needle was removed. A 10/11 disposable trocar was then introduced with direct visualization with the laparoscope. A 5 mm port was then placed on the left side also under direct visualization. Inspection revealed the above-mentioned findings with normal anatomy. The distal end of each tube was grasped and elevated. Using bipolar cautery I was able to free the distal end of each tube from the ovary and fulgurate across the mesosalpinx and a proximal portion of the fallopian tube. Scissors were then used to remove the fallopian tube.  This is done bilaterally without difficulty. Both segments of tube were removed through the umbilical trocar. The 5 mm port was removed under direct visualization. All gas was allowed to deflate from the abdomen and the umbilical trocar was removed. Skin incisions were then closed with interrupted  subcuticular sutures of 4-0 Vicryl followed by Dermabond. The Hulka tenaculum and Foley were removed. The patient was taken down from stirrups. She was awakened in the operating room and taken to the recovery room in stable condition after tolerating the procedure well. Counts were correct and she had PAS hose on throughout the procedure.

## 2013-03-31 NOTE — Anesthesia Preprocedure Evaluation (Signed)
Anesthesia Evaluation  Patient identified by MRN, date of birth, ID band Patient awake    Reviewed: Allergy & Precautions, H&P , Patient's Chart, lab work & pertinent test results, reviewed documented beta blocker date and time   Airway Mallampati: II TM Distance: >3 FB Neck ROM: full    Dental no notable dental hx.    Pulmonary Current Smoker,  breath sounds clear to auscultation  Pulmonary exam normal       Cardiovascular Rhythm:regular Rate:Normal     Neuro/Psych    GI/Hepatic   Endo/Other  Morbid obesity  Renal/GU      Musculoskeletal   Abdominal   Peds  Hematology   Anesthesia Other Findings   Reproductive/Obstetrics                           Anesthesia Physical Anesthesia Plan  ASA: III  Anesthesia Plan: General   Post-op Pain Management:    Induction: Intravenous  Airway Management Planned: Oral ETT  Additional Equipment:   Intra-op Plan:   Post-operative Plan: Extubation in OR  Informed Consent: I have reviewed the patients History and Physical, chart, labs and discussed the procedure including the risks, benefits and alternatives for the proposed anesthesia with the patient or authorized representative who has indicated his/her understanding and acceptance.   Dental Advisory Given and Dental advisory given  Plan Discussed with: CRNA and Surgeon  Anesthesia Plan Comments: (  Discussed general anesthesia, including possible nausea, instrumentation of airway, sore throat,pulmonary aspiration, etc. I asked if the were any outstanding questions, or  concerns before we proceeded. )        Anesthesia Quick Evaluation

## 2013-03-31 NOTE — Interval H&P Note (Signed)
History and Physical Interval Note:  03/31/2013 9:41 AM  [name] [name]  has presented today for surgery, with the diagnosis of Sterilization  The various methods of treatment have been discussed with the patient and family. After consideration of risks, benefits and other options for treatment, the patient has consented to  Procedure(s): LAPAROSCOPIC BILATERAL SALPINGECTOMY (Bilateral) as a surgical intervention .  The patient's history has been reviewed, patient examined, no change in status, stable for surgery.  I have reviewed the patient's chart and labs.  Questions were answered to the patient's satisfaction.     Jermar Colter D

## 2013-04-01 ENCOUNTER — Encounter (HOSPITAL_COMMUNITY): Payer: Self-pay | Admitting: Obstetrics and Gynecology

## 2013-04-01 NOTE — Anesthesia Postprocedure Evaluation (Signed)
  Anesthesia Post-op Note  Patient: [name] [name]  Procedure(s) Performed: Procedure(s): LAPAROSCOPIC BILATERAL SALPINGECTOMY (Bilateral) Patient is awake and responsive. Pain and nausea are reasonably well controlled. Vital signs are stable and clinically acceptable. Oxygen saturation is clinically acceptable. There are no apparent anesthetic complications at this time. Patient is ready for discharge.

## 2013-05-08 ENCOUNTER — Inpatient Hospital Stay (HOSPITAL_COMMUNITY): Payer: Medicaid Other

## 2013-05-08 ENCOUNTER — Encounter (HOSPITAL_COMMUNITY): Payer: Self-pay | Admitting: *Deleted

## 2013-05-08 ENCOUNTER — Inpatient Hospital Stay (HOSPITAL_COMMUNITY)
Admission: AD | Admit: 2013-05-08 | Discharge: 2013-05-08 | Disposition: A | Discharge status: Home / Self Care | Payer: Medicaid Other | Source: Ambulatory Visit | Attending: Obstetrics and Gynecology | Admitting: Obstetrics and Gynecology

## 2013-05-08 DIAGNOSIS — R1031 Right lower quadrant pain: Secondary | ICD-10-CM

## 2013-05-08 DIAGNOSIS — F319 Bipolar disorder, unspecified: Secondary | ICD-10-CM | POA: Insufficient documentation

## 2013-05-08 DIAGNOSIS — F429 Obsessive-compulsive disorder, unspecified: Secondary | ICD-10-CM | POA: Insufficient documentation

## 2013-05-08 DIAGNOSIS — F17200 Nicotine dependence, unspecified, uncomplicated: Secondary | ICD-10-CM | POA: Insufficient documentation

## 2013-05-08 DIAGNOSIS — Z3202 Encounter for pregnancy test, result negative: Secondary | ICD-10-CM | POA: Insufficient documentation

## 2013-05-08 LAB — CBC WITH DIFFERENTIAL/PLATELET
BASOS PCT: 0 % (ref 0–1)
Basophils Absolute: 0 10*3/uL (ref 0.0–0.1)
EOS PCT: 5 % (ref 0–5)
Eosinophils Absolute: 0.5 10*3/uL (ref 0.0–0.7)
HCT: 34.8 % — ABNORMAL LOW (ref 36.0–46.0)
HEMOGLOBIN: 11.1 g/dL — AB (ref 12.0–15.0)
Lymphocytes Relative: 29 % (ref 12–46)
Lymphs Abs: 2.8 10*3/uL (ref 0.7–4.0)
MCH: 22.6 pg — ABNORMAL LOW (ref 26.0–34.0)
MCHC: 31.9 g/dL (ref 30.0–36.0)
MCV: 70.9 fL — ABNORMAL LOW (ref 78.0–100.0)
MONOS PCT: 6 % (ref 3–12)
Monocytes Absolute: 0.6 10*3/uL (ref 0.1–1.0)
NEUTROS PCT: 60 % (ref 43–77)
Neutro Abs: 5.9 10*3/uL (ref 1.7–7.7)
Platelets: 313 10*3/uL (ref 150–400)
RBC: 4.91 MIL/uL (ref 3.87–5.11)
RDW: 14.5 % (ref 11.5–15.5)
WBC: 9.8 10*3/uL (ref 4.0–10.5)

## 2013-05-08 LAB — COMPREHENSIVE METABOLIC PANEL WITH GFR
ALT: 25 U/L (ref 0–35)
AST: 14 U/L (ref 0–37)
Albumin: 3.9 g/dL (ref 3.5–5.2)
Alkaline Phosphatase: 96 U/L (ref 39–117)
BUN: 12 mg/dL (ref 6–23)
CO2: 25 meq/L (ref 19–32)
Calcium: 9.1 mg/dL (ref 8.4–10.5)
Chloride: 104 meq/L (ref 96–112)
Creatinine, Ser: 0.7 mg/dL (ref 0.50–1.10)
GFR calc Af Amer: >90 mL/min
GFR calc non Af Amer: >90 mL/min
Glucose, Bld: 94 mg/dL (ref 70–99)
Potassium: 4.3 meq/L (ref 3.7–5.3)
Sodium: 141 meq/L (ref 137–147)
Total Bilirubin: 0.2 mg/dL — ABNORMAL LOW (ref 0.3–1.2)
Total Protein: 7.1 g/dL (ref 6.0–8.3)

## 2013-05-08 LAB — URINALYSIS, ROUTINE W REFLEX MICROSCOPIC
BILIRUBIN URINE: NEGATIVE
GLUCOSE, UA: NEGATIVE mg/dL
KETONES UR: NEGATIVE mg/dL
Leukocytes, UA: NEGATIVE
NITRITE: NEGATIVE
PH: 5.5 (ref 5.0–8.0)
Protein, ur: NEGATIVE mg/dL
Urobilinogen, UA: 0.2 mg/dL (ref 0.0–1.0)

## 2013-05-08 LAB — POCT PREGNANCY, URINE: Preg Test, Ur: NEGATIVE

## 2013-05-08 LAB — URINE MICROSCOPIC-ADD ON

## 2013-05-08 MED ORDER — IOHEXOL 300 MG/ML  SOLN
100.0000 mL | Freq: Once | INTRAMUSCULAR | Status: AC | PRN
  Administered 2013-05-08: 100 mL via INTRAVENOUS

## 2013-05-08 MED ORDER — IBUPROFEN 600 MG PO TABS: 600.0000 mg | ORAL_TABLET | Freq: Four times a day (QID) | ORAL | Status: DC | PRN

## 2013-05-08 MED ORDER — IOHEXOL 300 MG/ML  SOLN
50.0000 mL | INTRAMUSCULAR | Status: AC
  Administered 2013-05-08: 50 mL via ORAL

## 2013-05-08 MED ORDER — HYDROMORPHONE HCL PF 1 MG/ML IJ SOLN
1.0000 mg | Freq: Once | INTRAMUSCULAR | Status: AC
  Administered 2013-05-08: 1 mg via INTRAMUSCULAR
  Filled 2013-05-08: qty 1

## 2013-05-08 MED ORDER — ONDANSETRON HCL 4 MG PO TABS
4.0000 mg | ORAL_TABLET | Freq: Once | ORAL | Status: AC
  Administered 2013-05-08: 4 mg via ORAL
  Filled 2013-05-08: qty 1

## 2013-05-08 NOTE — MAU Provider Note (Signed)
History     CSN: [id number]  Arrival date and time: 05/08/13 1046   First Provider Initiated Contact with Patient 05/08/13 1119      Chief Complaint  Patient presents with  . Abdominal Pain  . Nausea   HPI This is a 23 y.o. female who presents with c/o severe Right lower quadrant pain.  Denies fever. States has had this pain since her BTL but it got suddenly worse today. Menses started yesterday, last menses late December. States was seen in the office for this pain and was told there was a small cyst and was referred to Gastroenterology. States "I know it is my female organs and I am mad that they said it was my GI system, when I know it is my female organs". Had a vaginal delivery in September, induced so that she could have a pulmonary empyema drained. She then had an elective BTL in December.  Delivery and BTL done by Dr Willis Modena   RN Note;  Patient states she has had right side pain since she had her tubes tied in December. States it is getting worse since this am. Has nausea, no vomiting. States she started her period yesterday with heavy bleeding.        OB History   Grav Para Term Preterm Abortions TAB SAB Ect Mult Living   _0      Past Medical History  Diagnosis Date  . Anxiety   . Chronic pain     muscle spasms- entire body  . Infection     UTI  . Fracture, ankle     left- x2  . Hydronephrosis   . Bipolar 1 disorder   . Post traumatic stress disorder (PTSD)   . Depression     doing fine now  . OCD (obsessive compulsive disorder)     Past Surgical History  Procedure Laterality Date  . No past surgeries    . Nephrostomy tube placement at 25wks and removed at 335/7 Right   . Cystoscopy with stent placement Right 12/25/2012    Procedure: CYSTOSCOPY WITH STENT PLACEMENT;  Surgeon: Franchot Gallo, MD;  Location: [facility];  Service: Urology;  Laterality: Right;  . Video assisted thoracoscopy (vats)/decortication Right 01/08/2013    Procedure:  VIDEO ASSISTED THORACOSCOPY DECORTICATION; Drainage of Pleural Effusion;  Surgeon: Melrose Nakayama, MD;  Location: [place];  Service: Thoracic;  Laterality: Right;  . Laparoscopic bilateral salpingectomy Bilateral 03/31/2013    Procedure: LAPAROSCOPIC BILATERAL SALPINGECTOMY;  Surgeon: Cheri Fowler, MD;  Location: [facility];  Service: Gynecology;  Laterality: Bilateral;    Family History  Problem Relation Age of Onset  . Heart disease Mother   . Hypertension Father   . Cancer Paternal Grandmother     lung  . Heart disease Paternal Grandmother     History  Substance Use Topics  . Smoking status: Current Some Day Smoker -- 0.25 packs/day    Types: Cigarettes    Last Attempt to Quit: 09/29/2012  . Smokeless tobacco: Never Used     Comment: has cut down  . Alcohol Use: Yes     Comment: occasionally-not now-wine    Allergies:  Allergies  Allergen Reactions  . Cinnamon Anaphylaxis and Hives  . Shellfish Allergy Anaphylaxis  . Augmentin [Amoxicillin-Pot Clavulanate] Nausea And Vomiting  . Nickel Rash    Prescriptions prior to admission  Medication Sig Dispense Refill  . ALPRAZolam (XANAX XR) 0.5 MG 24 hr tablet Take  0.5 mg by mouth daily as needed for anxiety.      . ranitidine (ZANTAC) 150 MG tablet Take 150 mg by mouth daily as needed for heartburn.        Review of Systems  Constitutional: Negative for fever, chills and malaise/fatigue.  Gastrointestinal: Positive for nausea, vomiting and abdominal pain. Negative for diarrhea and constipation.  Genitourinary: Negative for dysuria.  Neurological: Negative for headaches.   Physical Exam   Blood pressure 139/82, pulse 98, temperature 98 F (36.7 C), temperature source Oral, resp. rate 20, height _0  (1.6 m), weight 83.462 kg (184 lb), last menstrual period 05/07/2013, SpO2 100.00%.  Physical Exam  Constitutional: She is oriented to person, place, and time. She appears well-developed and well-nourished. She appears  distressed (with pain, crying).  Cardiovascular: Normal rate.   Respiratory: Effort normal.  GI: Soft. She exhibits no distension and no mass. There is tenderness (over RLQ). There is no rebound.  Genitourinary:  Deferred due to menses and pt preference   Musculoskeletal: Normal range of motion.  Neurological: She is alert and oriented to person, place, and time.  Skin: Skin is warm and dry.  Psychiatric:  Upset, crying    MAU Course  Procedures  MDM Results for orders placed during the hospital encounter of 05/08/13 (from the past 72 hour(s))  URINALYSIS, ROUTINE W REFLEX MICROSCOPIC     Status: Abnormal   Collection Time    05/08/13 10:55 AM      Result Value Range   Color, Urine YELLOW  YELLOW   APPearance CLEAR  CLEAR   Specific Gravity, Urine >1.030 (*) 1.005 - 1.030   pH 5.5  5.0 - 8.0   Glucose, UA NEGATIVE  NEGATIVE mg/dL   Hgb urine dipstick LARGE (*) NEGATIVE   Bilirubin Urine NEGATIVE  NEGATIVE   Ketones, ur NEGATIVE  NEGATIVE mg/dL   Protein, ur NEGATIVE  NEGATIVE mg/dL   Urobilinogen, UA 0.2  0.0 - 1.0 mg/dL   Nitrite NEGATIVE  NEGATIVE   Leukocytes, UA NEGATIVE  NEGATIVE  URINE MICROSCOPIC-ADD ON     Status: None   Collection Time    05/08/13 10:55 AM      Result Value Range   Squamous Epithelial / LPF RARE  RARE   WBC, UA 0-2  <3 WBC/hpf   RBC / HPF 21-50  <3 RBC/hpf  POCT PREGNANCY, URINE     Status: None   Collection Time    05/08/13 11:12 AM      Result Value Range   Preg Test, Ur NEGATIVE  NEGATIVE   Comment:            THE SENSITIVITY OF THIS     METHODOLOGY IS >24 mIU/mL  CBC WITH DIFFERENTIAL     Status: Abnormal   Collection Time    05/08/13 11:50 AM      Result Value Range   WBC 9.8  4.0 - 10.5 K/uL   RBC 4.91  3.87 - 5.11 MIL/uL   Hemoglobin 11.1 (*) 12.0 - 15.0 g/dL   HCT 34.8 (*) 36.0 - 46.0 %   MCV 70.9 (*) 78.0 - 100.0 fL   MCH 22.6 (*) 26.0 - 34.0 pg   MCHC 31.9  30.0 - 36.0 g/dL   RDW 14.5  11.5 - 15.5 %   Platelets 313  150  - 400 K/uL   Neutrophils Relative % 60  43 - 77 %   Lymphocytes Relative 29  12 - 46 %  Monocytes Relative 6  3 - 12 %   Eosinophils Relative 5  0 - 5 %   Basophils Relative 0  0 - 1 %   Neutro Abs 5.9  1.7 - 7.7 K/uL   Lymphs Abs 2.8  0.7 - 4.0 K/uL   Monocytes Absolute 0.6  0.1 - 1.0 K/uL   Eosinophils Absolute 0.5  0.0 - 0.7 K/uL   Basophils Absolute 0.0  0.0 - 0.1 K/uL   Smear Review MORPHOLOGY UNREMARKABLE    COMPREHENSIVE METABOLIC PANEL     Status: Abnormal   Collection Time    05/08/13 11:50 AM      Result Value Range   Sodium 141  137 - 147 mEq/L   Potassium 4.3  3.7 - 5.3 mEq/L   Chloride 104  96 - 112 mEq/L   CO2 25  19 - 32 mEq/L   Glucose, Bld 94  70 - 99 mg/dL   BUN 12  6 - 23 mg/dL   Creatinine, Ser 0.70  0.50 - 1.10 mg/dL   Calcium 9.1  8.4 - 10.5 mg/dL   Total Protein 7.1  6.0 - 8.3 g/dL   Albumin 3.9  3.5 - 5.2 g/dL   AST 14  0 - 37 U/L   ALT 25  0 - 35 U/L   Alkaline Phosphatase 96  39 - 117 U/L   Total Bilirubin 0.2 (*) 0.3 - 1.2 mg/dL   GFR calc non Af Amer >90  >90 mL/min   GFR calc Af Amer >90  >90 mL/min   Comment: (NOTE)     The eGFR has been calculated using the CKD EPI equation.     This calculation has not been validated in all clinical situations.     eGFR's persistently <90 mL/min signify possible Chronic Kidney     Disease.   US Transvaginal Non-ob  05/08/2013   CLINICAL DATA:  Severe right lower quadrant pain.  EXAM: TRANSABDOMINAL AND TRANSVAGINAL ULTRASOUND OF PELVIS  TECHNIQUE: Both transabdominal and transvaginal ultrasound examinations of the pelvis were performed. Transabdominal technique was performed for global imaging of the pelvis including uterus, ovaries, adnexal regions, and pelvic cul-de-sac. It was necessary to proceed with endovaginal exam following the transabdominal exam to visualize the ovaries.  COMPARISON:  None    FINDINGS: Uterus  Measurements: 6.9 x 3.2 x 3.9 cm, within normal limits. No fibroids or other mass  visualized.  Endometrium  Thickness: 7.9 mm, within normal limits. No focal abnormality visualized.  Right ovary  Measurements: 2.2 x 1.8 x 1.5 cm, within normal limits. Normal appearance/no adnexal mass.  Left ovary  Measurements: 2.0 x 1.4 x 2.3 cm, within normal limits. Normal appearance/no adnexal mass.  Other findings  No free fluid.    IMPRESSION: Negative pelvic ultrasound. The right ovary is within normal limits.     Electronically Signed   By: Lawrence Santiago M.D.   On: 05/08/2013 13:38   Discussed with Dr Melba Coon:   WIll get CT to rule out appendicitis.  Patient agreeable     US Transvaginal Non-ob  05/08/2013   CLINICAL DATA:  Severe right lower quadrant pain.  EXAM: TRANSABDOMINAL AND TRANSVAGINAL ULTRASOUND OF PELVIS  TECHNIQUE: Both transabdominal and transvaginal ultrasound examinations of the pelvis were performed. Transabdominal technique was performed for global imaging of the pelvis including uterus, ovaries, adnexal regions, and pelvic cul-de-sac. It was necessary to proceed with endovaginal exam following the transabdominal exam to visualize the ovaries.  COMPARISON:  None  FINDINGS:  Uterus  Measurements: 6.9 x 3.2 x 3.9 cm, within normal limits. No fibroids or other mass visualized.  Endometrium  Thickness: 7.9 mm, within normal limits. No focal abnormality visualized.  Right ovary  Measurements: 2.2 x 1.8 x 1.5 cm, within normal limits. Normal appearance/no adnexal mass.  Left ovary  Measurements: 2.0 x 1.4 x 2.3 cm, within normal limits. Normal appearance/no adnexal mass.  Other findings  No free fluid.    IMPRESSION: Negative pelvic ultrasound. The right ovary is within normal limits.     Electronically Signed   By: Lawrence Santiago M.D.   On: 05/08/2013 13:38      Ct Abdomen Pelvis W Contrast  05/08/2013   CLINICAL DATA:  Right lower quadrant abdominal pain following tubal ligation 03/31/2013. The symptoms have been more intense today.  EXAM: CT ABDOMEN AND PELVIS WITH  CONTRAST  TECHNIQUE: Multidetector CT imaging of the abdomen and pelvis was performed using the standard protocol following bolus administration of intravenous contrast.  CONTRAST:  146m OMNIPAQUE IOHEXOL 300 MG/ML  SOLN  COMPARISON:  Pelvic ultrasound 05/08/2013.  FINDINGS: The lung bases are clear without focal nodule, mass, or airspace disease. The heart size is normal. No significant pleural or pericardial effusion is present.  The liver and spleen are within normal limits. The stomach, duodenum, and pancreas are within normal limits. The common bile duct and gallbladder are normal. The adrenal glands are within normal limits bilaterally. Kidneys and ureters are unremarkable. The urinary bladder is within normal limits.  The rectosigmoid colon is within normal limits. The remainder of the colon is unremarkable. The appendix is well visualized and normal. Small bowel is within normal limits. There is no significant adenopathy or free fluid. The uterus and adnexa are within normal limits.  The bone windows demonstrate no focal lytic or blastic lesions.    IMPRESSION: 1. No acute or focal abnormality to explain the patient's symptoms. 2. Normal appearance of the appendix. 3. The uterus and adnexa are within normal limits bilaterally.     Electronically Signed   By: CLawrence SantiagoM.D.   On: 05/08/2013 16:05   Assessment and Plan  A:  Right lower quadrant abdominal pain      Unknown origin of pain       Normal radiologic findings  P:  Discussed with Dr BMelba Coon      Discharge home       Rx Ibuprofen       [facility]  WHansel Feinstein1/22/2015, 12:21 PM bo

## 2013-05-08 NOTE — MAU Note (Signed)
Patient states she has had right side pain since she had her tubes tied in December. States it is getting worse since this am. Has nausea, no vomiting. States she started her period yesterday with heavy bleeding.

## 2013-05-08 NOTE — Discharge Instructions (Signed)
Abdominal Pain, Adult °Many things can cause abdominal pain. Usually, abdominal pain is not caused by a disease and will improve without treatment. It can often be observed and treated at home. Your health care provider will do a physical exam and possibly order blood tests and X-rays to help determine the seriousness of your pain. However, in many cases, more time must pass before a clear cause of the pain can be found. Before that point, your health care provider may not know if you need more testing or further treatment. °HOME CARE INSTRUCTIONS  °Monitor your abdominal pain for any changes. The following actions may help to alleviate any discomfort you are experiencing: °· Only take over-the-counter or prescription medicines as directed by your health care provider. °· Do not take laxatives unless directed to do so by your health care provider. °· Try a clear liquid diet (broth, tea, or water) as directed by your health care provider. Slowly move to a bland diet as tolerated. °SEEK MEDICAL CARE IF: °· You have unexplained abdominal pain. °· You have abdominal pain associated with nausea or diarrhea. °· You have pain when you urinate or have a bowel movement. °· You experience abdominal pain that wakes you in the night. °· You have abdominal pain that is worsened or improved by eating food. °· You have abdominal pain that is worsened with eating fatty foods. °SEEK IMMEDIATE MEDICAL CARE IF:  °· Your pain does not go away within 2 hours. °· You have a fever. °· You keep throwing up (vomiting). °· Your pain is felt only in portions of the abdomen, such as the right side or the left lower portion of the abdomen. °· You pass bloody or black tarry stools. °MAKE SURE YOU: °· Understand these instructions.   °· Will watch your condition.   °· Will get help right away if you are not doing well or get worse.   °Document Released: 01/11/2005 Document Revised: 01/22/2013 Document Reviewed: 12/11/2012 °ExitCare® Patient  Information ©2014 [employer]. ° °

## 2013-06-15 HISTORY — PX: WISDOM TOOTH EXTRACTION: SHX21

## 2013-09-25 ENCOUNTER — Emergency Department (HOSPITAL_BASED_OUTPATIENT_CLINIC_OR_DEPARTMENT_OTHER)
Admission: EM | Admit: 2013-09-25 | Discharge: 2013-09-26 | Disposition: A | Discharge status: Home / Self Care | Payer: Medicaid Other | Attending: Emergency Medicine | Admitting: Emergency Medicine

## 2013-09-25 ENCOUNTER — Encounter (HOSPITAL_BASED_OUTPATIENT_CLINIC_OR_DEPARTMENT_OTHER): Payer: Self-pay | Admitting: Emergency Medicine

## 2013-09-25 DIAGNOSIS — N72 Inflammatory disease of cervix uteri: Secondary | ICD-10-CM

## 2013-09-25 DIAGNOSIS — Z3202 Encounter for pregnancy test, result negative: Secondary | ICD-10-CM | POA: Insufficient documentation

## 2013-09-25 DIAGNOSIS — F3289 Other specified depressive episodes: Secondary | ICD-10-CM | POA: Insufficient documentation

## 2013-09-25 DIAGNOSIS — F411 Generalized anxiety disorder: Secondary | ICD-10-CM | POA: Insufficient documentation

## 2013-09-25 DIAGNOSIS — Z8781 Personal history of (healed) traumatic fracture: Secondary | ICD-10-CM | POA: Insufficient documentation

## 2013-09-25 DIAGNOSIS — G8929 Other chronic pain: Secondary | ICD-10-CM | POA: Insufficient documentation

## 2013-09-25 DIAGNOSIS — Z79899 Other long term (current) drug therapy: Secondary | ICD-10-CM | POA: Insufficient documentation

## 2013-09-25 DIAGNOSIS — Z8659 Personal history of other mental and behavioral disorders: Secondary | ICD-10-CM | POA: Insufficient documentation

## 2013-09-25 DIAGNOSIS — F329 Major depressive disorder, single episode, unspecified: Secondary | ICD-10-CM | POA: Insufficient documentation

## 2013-09-25 DIAGNOSIS — N390 Urinary tract infection, site not specified: Secondary | ICD-10-CM | POA: Insufficient documentation

## 2013-09-25 DIAGNOSIS — F17200 Nicotine dependence, unspecified, uncomplicated: Secondary | ICD-10-CM | POA: Insufficient documentation

## 2013-09-25 LAB — URINALYSIS, ROUTINE W REFLEX MICROSCOPIC
Bilirubin Urine: NEGATIVE
Glucose, UA: NEGATIVE mg/dL
Ketones, ur: NEGATIVE mg/dL
Nitrite: NEGATIVE
PROTEIN: NEGATIVE mg/dL
Specific Gravity, Urine: 1.021 (ref 1.005–1.030)
Urobilinogen, UA: 0.2 mg/dL (ref 0.0–1.0)
pH: 5 (ref 5.0–8.0)

## 2013-09-25 LAB — PREGNANCY, URINE: Preg Test, Ur: NEGATIVE

## 2013-09-25 LAB — URINE MICROSCOPIC-ADD ON

## 2013-09-25 NOTE — ED Notes (Signed)
Reports internal vaginal pain. Period stopped suddenly yesterday after 3 days and normally lasts 8 days.  Sts she has a "knot" inside her vagina.

## 2013-09-25 NOTE — ED Notes (Signed)
Vaginal pain x 1.5 days,  Painful urination ,  Abnormal period

## 2013-09-25 NOTE — ED Notes (Signed)
Pelvic cart at bedside. 

## 2013-09-26 LAB — WET PREP, GENITAL
CLUE CELLS WET PREP: NONE SEEN
Trich, Wet Prep: NONE SEEN
Yeast Wet Prep HPF POC: NONE SEEN

## 2013-09-26 MED ORDER — HYDROCODONE-ACETAMINOPHEN 5-325 MG PO TABS: 1.0000 | ORAL_TABLET | Freq: Four times a day (QID) | ORAL | Status: DC | PRN

## 2013-09-26 MED ORDER — LIDOCAINE HCL (PF) 1 % IJ SOLN
INTRAMUSCULAR | Status: AC
  Administered 2013-09-26: 5 mL
  Filled 2013-09-26: qty 5

## 2013-09-26 MED ORDER — CEFTRIAXONE SODIUM 250 MG IJ SOLR
250.0000 mg | Freq: Once | INTRAMUSCULAR | Status: AC
  Administered 2013-09-26: 250 mg via INTRAMUSCULAR
  Filled 2013-09-26: qty 250

## 2013-09-26 MED ORDER — ONDANSETRON 4 MG PO TBDP
4.0000 mg | ORAL_TABLET | Freq: Once | ORAL | Status: AC
  Administered 2013-09-26: 4 mg via ORAL
  Filled 2013-09-26: qty 1

## 2013-09-26 MED ORDER — HYDROCODONE-ACETAMINOPHEN 5-325 MG PO TABS
1.0000 | ORAL_TABLET | Freq: Once | ORAL | Status: AC
  Administered 2013-09-26: 1 via ORAL
  Filled 2013-09-26: qty 1

## 2013-09-26 MED ORDER — NITROFURANTOIN MONOHYD MACRO 100 MG PO CAPS
100.0000 mg | ORAL_CAPSULE | Freq: Two times a day (BID) | ORAL | Status: DC
Start: 2013-09-26 — End: 2014-01-12

## 2013-09-26 MED ORDER — AZITHROMYCIN 250 MG PO TABS
1000.0000 mg | ORAL_TABLET | Freq: Once | ORAL | Status: AC
  Administered 2013-09-26: 1000 mg via ORAL
  Filled 2013-09-26: qty 4

## 2013-09-26 NOTE — ED Provider Notes (Signed)
CSN: [id number]     Arrival date & time 09/25/13  2247 History   First MD Initiated Contact with Patient 09/26/13 0017     Chief Complaint  Patient presents with  . Vaginal Pain     (Consider location/radiation/quality/duration/timing/severity/associated sxs/prior Treatment) HPI This is a 23 year old female who started her menses and is scheduled 5 days ago. Her menses abruptly stopped 3 days later when usually they last about 8 days. Since that time she has developed pain in her vagina which feels like there is a mass in her vagina. Symptoms are moderate to severe, worse with movement and improved with lying still. She denies fever, chills, nausea, vomiting, diarrhea, vaginal bleeding or vaginal discharge. She is having some difficulty voiding but no burning with urination.  Past Medical History  Diagnosis Date  . Anxiety   . Chronic pain     muscle spasms- entire body  . Infection     UTI  . Fracture, ankle     left- x2  . Hydronephrosis   . Bipolar 1 disorder   . Post traumatic stress disorder (PTSD)   . Depression     doing fine now  . OCD (obsessive compulsive disorder)    Past Surgical History  Procedure Laterality Date  . No past surgeries    . Nephrostomy tube placement at 25wks and removed at 335/7 Right   . Cystoscopy with stent placement Right 12/25/2012    Procedure: CYSTOSCOPY WITH STENT PLACEMENT;  Surgeon: Marcine MatarStephen Dahlstedt, MD;  Location: [facility];  Service: Urology;  Laterality: Right;  . Video assisted thoracoscopy (vats)/decortication Right 01/08/2013    Procedure: VIDEO ASSISTED THORACOSCOPY DECORTICATION; Drainage of Pleural Effusion;  Surgeon: Loreli SlotSteven C Hendrickson, MD;  [facility];  Service: Thoracic;  Laterality: Right;  . Laparoscopic bilateral salpingectomy Bilateral 03/31/2013    Procedure: LAPAROSCOPIC BILATERAL SALPINGECTOMY;  Surgeon: Lavina Hammanodd Meisinger, MD;  Location: [facility];  Service: Gynecology;  Laterality: Bilateral;  . Tubal ligation     Family  History  Problem Relation Age of Onset  . Heart disease Mother   . Hypertension Father   . Cancer Paternal Grandmother     lung  . Heart disease Paternal Grandmother    History  Substance Use Topics  . Smoking status: Current Some Day Smoker -- 0.25 packs/day    Types: Cigarettes    Last Attempt to Quit: 09/29/2012  . Smokeless tobacco: Never Used     Comment: has cut down  . Alcohol Use: Yes     Comment: occasionally wine   OB History   Grav Para Term Preterm Abortions TAB SAB Ect Mult Living   2 1 0 0 0 0 0 0 0 0      Review of Systems  All other systems reviewed and are negative.   Allergies  Cinnamon; Shellfish allergy; Augmentin; and Nickel  Home Medications   Prior to Admission medications   Medication Sig Start Date End Date Taking? Authorizing Provider  LORazepam (ATIVAN) 0.5 MG tablet Take 0.5 mg by mouth 2 (two) times daily.   Yes Historical Provider, MD  ALPRAZolam (XANAX XR) 0.5 MG 24 hr tablet Take 0.5 mg by mouth daily as needed for anxiety.    Historical Provider, MD  ibuprofen (ADVIL,MOTRIN) 600 MG tablet Take 1 tablet (600 mg total) by mouth every 6 (six) hours as needed. 05/08/13   Aviva SignsMarie L Williams, CNM  ranitidine (ZANTAC) 150 MG tablet Take 150 mg by mouth daily as needed for heartburn.  Historical Provider, MD   BP 142/88  Pulse 88  Temp(Src) 98.5 F (36.9 C) (Oral)  Resp 20  Ht 5\' 2"  (1.575 m)  Wt 180 lb (81.647 kg)  BMI 32.91 kg/m2  SpO2 99%  LMP 09/21/2013  Physical Exam General: Well-developed, well-nourished female in no acute distress; appearance consistent with age of record HENT: normocephalic; atraumatic Eyes: pupils equal, round and reactive to light; extraocular muscles intact Neck: supple Heart: regular rate and rhythm; no murmurs, rubs or gallops Lungs: clear to auscultation bilaterally Abdomen: soft; nondistended; nontender; no masses or hepatosplenomegaly; bowel sounds present GU: No CVA tenderness; normal external  genitalia; green mucoid cervical discharge; no cervical motion tenderness; left adnexal tenderness; bladder tenderness Extremities: No deformity; full range of motion; pulses normal Neurologic: Awake, alert and oriented; motor function intact in all extremities and symmetric; no facial droop Skin: Warm and dry Psychiatric: Normal mood and affect    ED Course  Procedures (including critical care time)  MDM   Nursing notes and vitals signs, including pulse oximetry, reviewed.  Summary of this visit's results, reviewed by myself:  Labs:  Results for orders placed during the hospital encounter of 09/25/13 (from the past 24 hour(s))  URINALYSIS, ROUTINE W REFLEX MICROSCOPIC     Status: Abnormal   Collection Time    09/25/13 10:59 PM      Result Value Ref Range   Color, Urine YELLOW  YELLOW   APPearance CLOUDY (*) CLEAR   Specific Gravity, Urine 1.021  1.005 - 1.030   pH 5.0  5.0 - 8.0   Glucose, UA NEGATIVE  NEGATIVE mg/dL   Hgb urine dipstick SMALL (*) NEGATIVE   Bilirubin Urine NEGATIVE  NEGATIVE   Ketones, ur NEGATIVE  NEGATIVE mg/dL   Protein, ur NEGATIVE  NEGATIVE mg/dL   Urobilinogen, UA 0.2  0.0 - 1.0 mg/dL   Nitrite NEGATIVE  NEGATIVE   Leukocytes, UA MODERATE (*) NEGATIVE  PREGNANCY, URINE     Status: None   Collection Time    09/25/13 10:59 PM      Result Value Ref Range   Preg Test, Ur NEGATIVE  NEGATIVE  URINE MICROSCOPIC-ADD ON     Status: Abnormal   Collection Time    09/25/13 10:59 PM      Result Value Ref Range   Squamous Epithelial / LPF FEW (*) RARE   WBC, UA 7-10  <3 WBC/hpf   RBC / HPF 3-6  <3 RBC/hpf   Bacteria, UA FEW (*) RARE  WET PREP, GENITAL     Status: Abnormal   Collection Time    09/26/13 12:45 AM      Result Value Ref Range   Yeast Wet Prep HPF POC NONE SEEN  NONE SEEN   Trich, Wet Prep NONE SEEN  NONE SEEN   Clue Cells Wet Prep HPF POC NONE SEEN  NONE SEEN   WBC, Wet Prep HPF POC MODERATE (*) NONE SEEN   12:59 AM Treated for  cervicitis and UTI. We'll have the patient return for an ultrasound later today to evaluate for an ovarian cyst or other pelvic pathology. Her degree of tenderness was more significant than that expected with just cystitis.   Hanley SeamenJohn L Pyper Olexa, MD 09/26/13 [phone]

## 2013-09-26 NOTE — Discharge Instructions (Signed)
Cervicitis °Cervicitis is a soreness and swelling (inflammation) of the cervix. Your cervix is located at the bottom of your uterus. It opens up to the vagina. °CAUSES  °· Sexually transmitted infections (STIs).   °· Allergic reaction.   °· Medicines or birth control devices that are put in the vagina.   °· Injury to the cervix.   °· Bacterial infections.   °RISK FACTORS °You are at greater risk if you: °· Have unprotected sexual intercourse. °· Have sexual intercourse with many partners. °· Began sexual intercourse at an early age. °· Have a history of STIs. °SYMPTOMS  °There may be no symptoms. If symptoms occur, they may include:  °· Grey, white, yellow, or bad-smelling vaginal discharge.   °· Pain or itching of the area outside the vagina.   °· Painful sexual intercourse.   °· Lower abdominal or lower back pain, especially during intercourse.   °· Frequent urination.   °· Abnormal vaginal bleeding between periods, after sexual intercourse, or after menopause.   °· Pressure or a heavy feeling in the pelvis.   °DIAGNOSIS  °Diagnosis is made after a pelvic exam. Other tests may include:  °· Examination of any discharge under a microscope (wet prep).   °· A Pap test.   °TREATMENT  °Treatment will depend on the cause of cervicitis. If it is caused by an STI, both you and your partner will need to be treated. Antibiotic medicines will be given.  °HOME CARE INSTRUCTIONS  °· Do not have sexual intercourse until your health care provider says it is okay.   °· Do not have sexual intercourse until your partner has been treated, if your cervicitis is caused by an STI.   °· Take your antibiotics as directed. Finish them even if you start to feel better.   °SEEK MEDICAL CARE IF: °· Your symptoms come back.   °· You have a fever.   °MAKE SURE YOU:  °· Understand these instructions. °· Will watch your condition. °· Will get help right away if you are not doing well or get worse. °Document Released: 04/03/2005 Document Revised:  12/04/2012 Document Reviewed: 09/25/2012 °ExitCare® Patient Information ©2014 [employer]. ° °

## 2013-09-27 LAB — URINE CULTURE: Colony Count: 50000

## 2013-09-27 LAB — GC/CHLAMYDIA PROBE AMP
CT Probe RNA: NEGATIVE
GC Probe RNA: NEGATIVE

## 2014-01-12 ENCOUNTER — Encounter (HOSPITAL_COMMUNITY)
Admission: RE | Admit: 2014-01-12 | Discharge: 2014-01-12 | Disposition: A | Discharge status: Home / Self Care | Payer: Medicaid Other | Source: Ambulatory Visit | Attending: Obstetrics and Gynecology | Admitting: Obstetrics and Gynecology

## 2014-01-12 ENCOUNTER — Encounter (HOSPITAL_COMMUNITY): Payer: Self-pay

## 2014-01-12 DIAGNOSIS — N949 Unspecified condition associated with female genital organs and menstrual cycle: Secondary | ICD-10-CM | POA: Diagnosis not present

## 2014-01-12 DIAGNOSIS — Z01812 Encounter for preprocedural laboratory examination: Secondary | ICD-10-CM | POA: Diagnosis present

## 2014-01-12 HISTORY — DX: Headache: R51

## 2014-01-12 HISTORY — DX: Other specified postprocedural states: Z98.890

## 2014-01-12 HISTORY — DX: Anemia, unspecified: D64.9

## 2014-01-12 HISTORY — DX: Unspecified osteoarthritis, unspecified site: M19.90

## 2014-01-12 HISTORY — DX: Nausea with vomiting, unspecified: R11.2

## 2014-01-12 HISTORY — DX: Personal history of other medical treatment: Z92.89

## 2014-01-12 HISTORY — DX: Heartburn: R12

## 2014-01-12 LAB — CBC
HCT: 37.1 % (ref 36.0–46.0)
Hemoglobin: 12 g/dL (ref 12.0–15.0)
MCH: 24.5 pg — ABNORMAL LOW (ref 26.0–34.0)
MCHC: 32.3 g/dL (ref 30.0–36.0)
MCV: 75.7 fL — ABNORMAL LOW (ref 78.0–100.0)
PLATELETS: 350 10*3/uL (ref 150–400)
RBC: 4.9 MIL/uL (ref 3.87–5.11)
RDW: 14.8 % (ref 11.5–15.5)
WBC: 11.5 10*3/uL — AB (ref 4.0–10.5)

## 2014-01-12 NOTE — Patient Instructions (Addendum)
   Your procedure is scheduled on: Friday, Oct 9  Enter through the Main Entrance of [facility] at: 6 AM Pick up the phone at the desk and dial [phone] and inform us of your arrival.  Please call this number if you have any problems the morning of surgery: [phone]  Remember: Do not eat or drink after midnight: Thursday Take these medicines the morning of surgery with a SIP OF WATER: ativan  Do not wear jewelry, make-up, or FINGER nail polish No metal in your hair or on your body. Do not wear lotions, powders, perfumes.  You may wear deodorant.  Do not bring valuables to the hospital. Contacts, dentures or bridgework may not be worn into surgery.  Leave suitcase in the car. After Surgery it may be brought to your room. For patients being admitted to the hospital, checkout time is 11:00am the day of discharge.    Patients discharged on the day of surgery will not be allowed to drive home.  Home with fiance Apolinar Junes cell [phone] or father Gerlene Burdock home [phone]

## 2014-01-12 NOTE — Pre-Procedure Instructions (Signed)
SDS BB LOG GIVEN TO LAB FOR HISTORY OF BLOOD TRANSFUSION AT [facility] IN 2014.

## 2014-01-22 ENCOUNTER — Encounter (HOSPITAL_COMMUNITY): Payer: Self-pay | Admitting: Anesthesiology

## 2014-01-22 NOTE — Anesthesia Preprocedure Evaluation (Addendum)
Anesthesia Evaluation  Patient identified by MRN, date of birth, ID band Patient awake    Reviewed: Allergy & Precautions, H&P , NPO status , Patient's Chart, lab work & pertinent test results  History of Anesthesia Complications (+) PONV and history of anesthetic complications  Airway Mallampati: I TM Distance: >3 FB Neck ROM: Full    Dental no notable dental hx. (+) Teeth Intact   Pulmonary pneumonia -, resolved, Current Smoker,  breath sounds clear to auscultation  Pulmonary exam normal       Cardiovascular negative cardio ROS  Rhythm:Regular Rate:Normal     Neuro/Psych  Headaches, PSYCHIATRIC DISORDERS Anxiety Depression Bipolar Disorder PTSD OCD   GI/Hepatic negative GI ROS, Neg liver ROS, GERD-  ,  Endo/Other  Obesity  Renal/GU Renal diseaseHydronephrosis  negative genitourinary   Musculoskeletal  (+) Arthritis -,   Abdominal (+) + obese,   Peds  Hematology  (+) anemia , Hx/o blood transfusion   Anesthesia Other Findings   Reproductive/Obstetrics Pelvic pain                         Anesthesia Physical Anesthesia Plan  ASA: II  Anesthesia Plan: General   Post-op Pain Management:    Induction: Intravenous  Airway Management Planned: Oral ETT  Additional Equipment:   Intra-op Plan:   Post-operative Plan: Extubation in OR  Informed Consent: I have reviewed the patients History and Physical, chart, labs and discussed the procedure including the risks, benefits and alternatives for the proposed anesthesia with the patient or authorized representative who has indicated his/her understanding and acceptance.   Dental advisory given  Plan Discussed with: CRNA, Anesthesiologist and Surgeon  Anesthesia Plan Comments:         Anesthesia Quick Evaluation

## 2014-01-22 NOTE — H&P (Signed)
[name] [name] is an 23 y.o. female. She had laparoscopic bilateral salpingectomy last December, has had pelvic pain off and on since then, getting worse.  She and her PCP are concerned she may have endometriosis, even though there was no endometriosis at that laparoscopy.  She is tired of feeling bad.  Discussed potential sources for pelvic pain, she would like laparoscopy to be sure she does not have endometriosis.  Pertinent Gynecological History: OB History: G1, P0101 Induced SVD at 35 weeks due to hydronephrosis and empyema   Menstrual History: No LMP recorded.    Past Medical History  Diagnosis Date  . Anxiety   . Chronic pain     muscle spasms- entire body, pinch nerve in upper back  . Infection     UTI -Resolved - History only  . Fracture, ankle     left- x2 - resolved  . Bipolar 1 disorder   . Post traumatic stress disorder (PTSD)   . Depression     doing fine now  . OCD (obsessive compulsive disorder)   . SVD (spontaneous vaginal delivery) 12/2012    [facility]  . PONV (postoperative nausea and vomiting)   . Heartburn     zantac prn  . Hydronephrosis     History  . Headache(784.0)     otc med prn  . Arthritis     knee, ankle, left wrist  . Anemia   . [facility] of [facility]2012    [facility]    Past Surgical History  Procedure Laterality Date  . Nephrostomy tube placement at 25wks and removed at 335/7 Right   . Cystoscopy with stent placement Right 12/25/2012    Procedure: CYSTOSCOPY WITH STENT PLACEMENT;  Surgeon: Marcine MatarStephen Dahlstedt, MD;  Location: [facility] ORS;  Service: Urology;  Laterality: Right;  . Video assisted thoracoscopy (vats)/decortication Right 01/08/2013    Procedure: VIDEO ASSISTED THORACOSCOPY DECORTICATION; Drainage of Pleural Effusion;  Surgeon: Loreli SlotSteven C Hendrickson, MD;  [facility];  Service: Thoracic;  Laterality: Right;  . Laparoscopic bilateral salpingectomy Bilateral 03/31/2013    Procedure: LAPAROSCOPIC  BILATERAL SALPINGECTOMY;  Surgeon: Lavina Hammanodd Londin Antone, MD;  Location: [facility] ORS;  Service: Gynecology;  Laterality: Bilateral;  . Tubal ligation    . Wisdom tooth extraction  06/2013    Family History  Problem Relation Age of Onset  . Heart disease Mother   . Hypertension Father   . Cancer Paternal Grandmother     lung  . Heart disease Paternal Grandmother     Social History:  reports that she has been smoking Cigarettes.  She has a 2.25 pack-year smoking history. She has never used smokeless tobacco. She reports that she drinks alcohol. She reports that she does not use illicit drugs.  Allergies:  Allergies  Allergen Reactions  . Cinnamon Anaphylaxis and Hives  . Shellfish Allergy Anaphylaxis  . Augmentin [Amoxicillin-Pot Clavulanate] Nausea And Vomiting  . Nickel Rash    No prescriptions prior to admission    Review of Systems  Respiratory: Negative.   Cardiovascular: Negative.   Gastrointestinal: Positive for constipation. Negative for heartburn, nausea, vomiting and diarrhea.  Genitourinary: Negative.     not currently breastfeeding. Physical Exam  Constitutional: She appears well-developed and well-nourished.  Neck: Neck supple. No thyromegaly present.  Cardiovascular: Normal rate, regular rhythm and normal heart sounds.   No murmur heard. Respiratory: Effort normal and breath sounds normal. No respiratory distress. She has no wheezes.  GI: Soft. She  exhibits no distension and no mass. Tenderness: mild, BLQ.  Genitourinary: Vagina normal.  Uterus normal size, slightly tender Mild adnexal tenderness, L>R Bladder slightly tender    No results found for this or any previous visit (from the past 24 hour(s)).  No results found.  Assessment/Plan: Chronic pelvic pain, normal pelvis last December at time of bilateral salpingectomy.  All medical and surgical options have been discussed, she strongly desires to proceed with laparoscopy.  The surgical procedure and risks have  all been discussed, as well as the chance that I will not find a source for her pain.    [name] [name] D 01/22/2014, 7:57 PM

## 2014-01-23 ENCOUNTER — Encounter (HOSPITAL_COMMUNITY)
Admission: RE | Disposition: A | Discharge status: Home / Self Care | Payer: Self-pay | Source: Ambulatory Visit | Attending: Obstetrics and Gynecology

## 2014-01-23 ENCOUNTER — Encounter (HOSPITAL_COMMUNITY): Payer: Self-pay | Admitting: Anesthesiology

## 2014-01-23 ENCOUNTER — Ambulatory Visit (HOSPITAL_COMMUNITY)
Admission: RE | Admit: 2014-01-23 | Discharge: 2014-01-23 | Disposition: A | Discharge status: Home / Self Care | Payer: Medicaid Other | Source: Ambulatory Visit | Attending: Obstetrics and Gynecology | Admitting: Obstetrics and Gynecology

## 2014-01-23 ENCOUNTER — Ambulatory Visit (HOSPITAL_COMMUNITY): Payer: Medicaid Other | Admitting: Anesthesiology

## 2014-01-23 ENCOUNTER — Encounter (HOSPITAL_COMMUNITY): Payer: Medicaid Other | Admitting: Anesthesiology

## 2014-01-23 DIAGNOSIS — M179 Osteoarthritis of knee, unspecified: Secondary | ICD-10-CM | POA: Diagnosis not present

## 2014-01-23 DIAGNOSIS — F17210 Nicotine dependence, cigarettes, uncomplicated: Secondary | ICD-10-CM | POA: Diagnosis not present

## 2014-01-23 DIAGNOSIS — F329 Major depressive disorder, single episode, unspecified: Secondary | ICD-10-CM | POA: Insufficient documentation

## 2014-01-23 DIAGNOSIS — R102 Pelvic and perineal pain unspecified side: Secondary | ICD-10-CM | POA: Diagnosis present

## 2014-01-23 DIAGNOSIS — F4310 Post-traumatic stress disorder, unspecified: Secondary | ICD-10-CM | POA: Insufficient documentation

## 2014-01-23 DIAGNOSIS — Z880 Allergy status to penicillin: Secondary | ICD-10-CM | POA: Diagnosis not present

## 2014-01-23 DIAGNOSIS — Z91013 Allergy to seafood: Secondary | ICD-10-CM | POA: Insufficient documentation

## 2014-01-23 DIAGNOSIS — M19079 Primary osteoarthritis, unspecified ankle and foot: Secondary | ICD-10-CM | POA: Diagnosis not present

## 2014-01-23 DIAGNOSIS — D649 Anemia, unspecified: Secondary | ICD-10-CM | POA: Diagnosis not present

## 2014-01-23 DIAGNOSIS — F319 Bipolar disorder, unspecified: Secondary | ICD-10-CM | POA: Diagnosis not present

## 2014-01-23 DIAGNOSIS — Z91018 Allergy to other foods: Secondary | ICD-10-CM | POA: Insufficient documentation

## 2014-01-23 DIAGNOSIS — K654 Sclerosing mesenteritis: Secondary | ICD-10-CM | POA: Insufficient documentation

## 2014-01-23 DIAGNOSIS — F419 Anxiety disorder, unspecified: Secondary | ICD-10-CM | POA: Diagnosis not present

## 2014-01-23 DIAGNOSIS — M19032 Primary osteoarthritis, left wrist: Secondary | ICD-10-CM | POA: Insufficient documentation

## 2014-01-23 HISTORY — PX: LAPAROSCOPY: SHX197

## 2014-01-23 LAB — PREGNANCY, URINE: PREG TEST UR: NEGATIVE

## 2014-01-23 SURGERY — LAPAROSCOPY, DIAGNOSTIC
Anesthesia: General

## 2014-01-23 MED ORDER — LIDOCAINE HCL (CARDIAC) 20 MG/ML IV SOLN
INTRAVENOUS | Status: AC
  Filled 2014-01-23: qty 5

## 2014-01-23 MED ORDER — OXYCODONE HCL 5 MG PO TABS
10.0000 mg | ORAL_TABLET | Freq: Once | ORAL | Status: AC
  Administered 2014-01-23: 10 mg via ORAL

## 2014-01-23 MED ORDER — ONDANSETRON HCL 4 MG/2ML IJ SOLN
INTRAMUSCULAR | Status: DC | PRN
Start: 2014-01-23 — End: 2014-01-23
  Administered 2014-01-23: 4 mg via INTRAVENOUS

## 2014-01-23 MED ORDER — MIDAZOLAM HCL 2 MG/2ML IJ SOLN
INTRAMUSCULAR | Status: DC | PRN
  Administered 2014-01-23 (×2): 1 mg via INTRAVENOUS

## 2014-01-23 MED ORDER — ROCURONIUM BROMIDE 100 MG/10ML IV SOLN
INTRAVENOUS | Status: DC | PRN
  Administered 2014-01-23: 35 mg via INTRAVENOUS

## 2014-01-23 MED ORDER — PROPOFOL 10 MG/ML IV BOLUS
INTRAVENOUS | Status: DC | PRN
  Administered 2014-01-23: 200 mg via INTRAVENOUS

## 2014-01-23 MED ORDER — DEXAMETHASONE SODIUM PHOSPHATE 4 MG/ML IJ SOLN
INTRAMUSCULAR | Status: AC
  Filled 2014-01-23: qty 1

## 2014-01-23 MED ORDER — LACTATED RINGERS IV SOLN
INTRAVENOUS | Status: DC
  Administered 2014-01-23: 1000 mL via INTRAVENOUS
  Administered 2014-01-23: 08:00:00 via INTRAVENOUS

## 2014-01-23 MED ORDER — ONDANSETRON HCL 4 MG/2ML IJ SOLN
INTRAMUSCULAR | Status: AC
  Filled 2014-01-23: qty 2

## 2014-01-23 MED ORDER — MEPERIDINE HCL 25 MG/ML IJ SOLN
INTRAMUSCULAR | Status: AC
  Filled 2014-01-23: qty 1

## 2014-01-23 MED ORDER — PROPOFOL 10 MG/ML IV EMUL
INTRAVENOUS | Status: AC
  Filled 2014-01-23: qty 40

## 2014-01-23 MED ORDER — ACETAMINOPHEN 10 MG/ML IV SOLN
1000.0000 mg | Freq: Four times a day (QID) | INTRAVENOUS | Status: DC
  Administered 2014-01-23: 1000 mg via INTRAVENOUS
  Filled 2014-01-23: qty 100

## 2014-01-23 MED ORDER — HYDROMORPHONE HCL 1 MG/ML IJ SOLN
0.5000 mg | INTRAMUSCULAR | Status: DC | PRN
  Administered 2014-01-23 (×2): 0.25 mg via INTRAVENOUS

## 2014-01-23 MED ORDER — 0.9 % SODIUM CHLORIDE (POUR BTL) OPTIME
TOPICAL | Status: DC | PRN
  Administered 2014-01-23: 1000 mL

## 2014-01-23 MED ORDER — FENTANYL CITRATE 0.05 MG/ML IJ SOLN
25.0000 ug | INTRAMUSCULAR | Status: DC | PRN
  Administered 2014-01-23 (×2): 50 ug via INTRAVENOUS

## 2014-01-23 MED ORDER — GLYCOPYRROLATE 0.2 MG/ML IJ SOLN
INTRAMUSCULAR | Status: DC | PRN
  Administered 2014-01-23: 0.6 mg via INTRAVENOUS

## 2014-01-23 MED ORDER — OXYCODONE HCL 5 MG PO TABS
ORAL_TABLET | ORAL | Status: AC
  Administered 2014-01-23: 10 mg via ORAL
  Filled 2014-01-23: qty 1

## 2014-01-23 MED ORDER — FENTANYL CITRATE 0.05 MG/ML IJ SOLN
INTRAMUSCULAR | Status: DC | PRN
  Administered 2014-01-23: 100 ug via INTRAVENOUS
  Administered 2014-01-23 (×5): 50 ug via INTRAVENOUS

## 2014-01-23 MED ORDER — SODIUM CHLORIDE 0.9 % IJ SOLN
INTRAMUSCULAR | Status: AC
  Filled 2014-01-23: qty 10

## 2014-01-23 MED ORDER — MIDAZOLAM HCL 2 MG/2ML IJ SOLN
INTRAMUSCULAR | Status: AC
  Filled 2014-01-23: qty 2

## 2014-01-23 MED ORDER — ROCURONIUM BROMIDE 100 MG/10ML IV SOLN
INTRAVENOUS | Status: AC
  Filled 2014-01-23: qty 1

## 2014-01-23 MED ORDER — DEXAMETHASONE SODIUM PHOSPHATE 10 MG/ML IJ SOLN
INTRAMUSCULAR | Status: DC | PRN
  Administered 2014-01-23: 4 mg via INTRAVENOUS

## 2014-01-23 MED ORDER — FENTANYL CITRATE 0.05 MG/ML IJ SOLN
INTRAMUSCULAR | Status: AC
  Filled 2014-01-23: qty 5

## 2014-01-23 MED ORDER — BUPIVACAINE HCL (PF) 0.25 % IJ SOLN
INTRAMUSCULAR | Status: AC
  Filled 2014-01-23: qty 30

## 2014-01-23 MED ORDER — MEPERIDINE HCL 25 MG/ML IJ SOLN
6.2500 mg | INTRAMUSCULAR | Status: DC | PRN
  Administered 2014-01-23: 12.5 mg via INTRAVENOUS

## 2014-01-23 MED ORDER — METOCLOPRAMIDE HCL 5 MG/ML IJ SOLN: 10.0000 mg | Freq: Once | INTRAMUSCULAR | Status: DC | PRN

## 2014-01-23 MED ORDER — KETOROLAC TROMETHAMINE 30 MG/ML IJ SOLN
INTRAMUSCULAR | Status: AC
  Filled 2014-01-23: qty 1

## 2014-01-23 MED ORDER — FENTANYL CITRATE 0.05 MG/ML IJ SOLN
INTRAMUSCULAR | Status: AC
  Filled 2014-01-23: qty 2

## 2014-01-23 MED ORDER — FENTANYL CITRATE 0.05 MG/ML IJ SOLN
INTRAMUSCULAR | Status: AC
  Administered 2014-01-23: 50 ug via INTRAVENOUS
  Filled 2014-01-23: qty 2

## 2014-01-23 MED ORDER — NEOSTIGMINE METHYLSULFATE 10 MG/10ML IV SOLN
INTRAVENOUS | Status: DC | PRN
  Administered 2014-01-23: 3 mg via INTRAVENOUS

## 2014-01-23 MED ORDER — KETOROLAC TROMETHAMINE 30 MG/ML IJ SOLN
INTRAMUSCULAR | Status: DC | PRN
  Administered 2014-01-23: 30 mg via INTRAVENOUS

## 2014-01-23 MED ORDER — SCOPOLAMINE 1 MG/3DAYS TD PT72
MEDICATED_PATCH | TRANSDERMAL | Status: AC
  Filled 2014-01-23: qty 1

## 2014-01-23 MED ORDER — OXYCODONE HCL 5 MG PO TABS: 5.0000 mg | ORAL_TABLET | ORAL | Status: DC | PRN

## 2014-01-23 MED ORDER — SCOPOLAMINE 1 MG/3DAYS TD PT72: 1.0000 | MEDICATED_PATCH | TRANSDERMAL | Status: DC

## 2014-01-23 MED ORDER — BUPIVACAINE HCL (PF) 0.25 % IJ SOLN
INTRAMUSCULAR | Status: DC | PRN
  Administered 2014-01-23: 10 mL

## 2014-01-23 MED ORDER — GLYCOPYRROLATE 0.2 MG/ML IJ SOLN
INTRAMUSCULAR | Status: AC
  Filled 2014-01-23: qty 3

## 2014-01-23 MED ORDER — HYDROMORPHONE HCL 1 MG/ML IJ SOLN
INTRAMUSCULAR | Status: AC
  Administered 2014-01-23: 0.25 mg via INTRAVENOUS
  Filled 2014-01-23: qty 1

## 2014-01-23 MED ORDER — SCOPOLAMINE 1 MG/3DAYS TD PT72
1.0000 | MEDICATED_PATCH | Freq: Once | TRANSDERMAL | Status: DC
  Administered 2014-01-23: 1.5 mg via TRANSDERMAL

## 2014-01-23 MED ORDER — LIDOCAINE HCL (CARDIAC) 20 MG/ML IV SOLN
INTRAVENOUS | Status: DC | PRN
  Administered 2014-01-23: 80 mg via INTRAVENOUS

## 2014-01-23 MED ORDER — SODIUM CHLORIDE 0.9 % IJ SOLN
INTRAMUSCULAR | Status: DC | PRN
  Administered 2014-01-23: 10 mL

## 2014-01-23 MED ORDER — NEOSTIGMINE METHYLSULFATE 10 MG/10ML IV SOLN
INTRAVENOUS | Status: AC
Start: 2014-01-23 — End: 2014-01-23
  Filled 2014-01-23: qty 1

## 2014-01-23 MED ORDER — OXYCODONE HCL 5 MG PO TABS
ORAL_TABLET | ORAL | Status: AC
  Filled 2014-01-23: qty 1

## 2014-01-23 SURGICAL SUPPLY — 32 items
ADH SKN CLS APL DERMABOND .7 (GAUZE/BANDAGES/DRESSINGS) ×1
BLADE SURG 11 STRL SS (BLADE) ×2 IMPLANT
CABLE HIGH FREQUENCY MONO STRZ (ELECTRODE) IMPLANT
CATH FOLEY 2WAY  3CC 10FR (CATHETERS)
CATH FOLEY 2WAY 3CC 10FR (CATHETERS) IMPLANT
CATH ROBINSON RED A/P 16FR (CATHETERS) ×1 IMPLANT
CHLORAPREP W/TINT 26ML (MISCELLANEOUS) ×2 IMPLANT
CLOTH BEACON ORANGE TIMEOUT ST (SAFETY) ×2 IMPLANT
DECANTER SPIKE VIAL GLASS SM (MISCELLANEOUS) ×2 IMPLANT
DERMABOND ADVANCED (GAUZE/BANDAGES/DRESSINGS) ×1
DERMABOND ADVANCED .7 DNX12 (GAUZE/BANDAGES/DRESSINGS) ×1 IMPLANT
DRSG COVADERM PLUS 2X2 (GAUZE/BANDAGES/DRESSINGS) ×4 IMPLANT
DRSG OPSITE POSTOP 3X4 (GAUZE/BANDAGES/DRESSINGS) ×1 IMPLANT
GLOVE BIO SURGEON STRL SZ8 (GLOVE) ×2 IMPLANT
GLOVE ORTHO TXT STRL SZ7.5 (GLOVE) ×2 IMPLANT
GOWN STRL REUS W/TWL LRG LVL3 (GOWN DISPOSABLE) ×4 IMPLANT
NEEDLE EPID 17G 5 ECHO TUOHY (NEEDLE) IMPLANT
NEEDLE INSUFFLATION 120MM (ENDOMECHANICALS) ×2 IMPLANT
NS IRRIG 1000ML POUR BTL (IV SOLUTION) ×2 IMPLANT
PACK LAPAROSCOPY BASIN (CUSTOM PROCEDURE TRAY) ×2 IMPLANT
PROTECTOR NERVE ULNAR (MISCELLANEOUS) ×2 IMPLANT
SET IRRIG TUBING LAPAROSCOPIC (IRRIGATION / IRRIGATOR) IMPLANT
SHEARS HARMONIC ACE PLUS 36CM (ENDOMECHANICALS) IMPLANT
SLEEVE XCEL OPT CAN 5 100 (ENDOMECHANICALS) IMPLANT
SOLUTION ELECTROLUBE (MISCELLANEOUS) IMPLANT
SUT VICRYL 0 UR6 27IN ABS (SUTURE) IMPLANT
SUT VICRYL 4-0 PS2 18IN ABS (SUTURE) ×2 IMPLANT
TOWEL OR 17X24 6PK STRL BLUE (TOWEL DISPOSABLE) ×4 IMPLANT
TROCAR XCEL NON-BLD 11X100MML (ENDOMECHANICALS) IMPLANT
TROCAR XCEL NON-BLD 5MMX100MML (ENDOMECHANICALS) ×2 IMPLANT
WARMER LAPAROSCOPE (MISCELLANEOUS) ×2 IMPLANT
WATER STERILE IRR 1000ML POUR (IV SOLUTION) ×2 IMPLANT

## 2014-01-23 NOTE — Op Note (Signed)
Preoperative diagnosis: Pelvic pain  Postoperative diagnosis: Same  Procedure: Diagnostic laparoscopy, right para-ovarian biopsies Surgeon: Lavina Hammanodd Monique Gift M.D.  Anesthesia: Gen. Endotracheal tube  Findings: She had a normal abdomen and pelvis with normal uterus, surgically absent tubes, normal ovaries.  There were two nodular areas where right tube had been, these were removed Specimens: Right peritoneal/para-ovarian biopsies Estimated blood loss: 50cc Complications: None   Procedure in detail:   The patient was taken to the operating room and placed in the dorsosupine position. General anesthesia was induced. Her legs were placed in mobile stirrups and her left arm was tucked to her side. Abdomen perineum and vagina were then prepped and draped in the usual sterile fashion, bladder drained with a red Robinson catheter, a Hulka tenaculum was applied to the cervix for uterine manipulation. Infraumbilical skin was then infiltrated with quarter percent Marcaine and a 1 cm vertical incision was made. The veress needle was inserted into the peritoneal cavity and placement confirmed by the water drop test and an opening pressure of 6 mm of mercury. CO2 was insufflated to a pressure of 12 mm of mercury and the veress needle was removed. A 5mm disposable trocar was then introduced with direct visualization with the laparoscope. A 5 mm port was then placed on the left side also under direct visualization. Careful and thorough inspection revealed the above-mentioned findings.  Using bipolar forceps and unipolar scissors, the two nodular/cystic areas on the right peritoneum in the para-ovarian area were removed.  The 5 mm port was removed under direct visualization. All gas was allowed to deflate from the abdomen and the umbilical trocar was removed. Skin incisions were then closed with interrupted subcuticular sutures of 4-0 Vicryl followed by steri-strips and sterile bandages. The Hulka tenaculum was removed. The  patient was taken down from stirrups. She was awakened in the operating room and taken to the recovery room in stable condition after tolerating the procedure well. Counts were correct and she had PAS hose on throughout the procedure.

## 2014-01-23 NOTE — Anesthesia Postprocedure Evaluation (Signed)
  Anesthesia Post Note  Patient: [name] [name]  Procedure(s) Performed: Procedure(s) (LRB): LAPAROSCOPY DIAGNOSTIC RIGHT PERITONEAL BIOPSY (N/A)  Anesthesia type: GA  Patient location: PACU  Post pain: Pain level controlled  Post assessment: Post-op Vital signs reviewed  Last Vitals:  Filed Vitals:   01/23/14 0845  BP: 132/68  Pulse: 98  Temp:   Resp: 13    Post vital signs: Reviewed  Level of consciousness: sedated  Complications: No apparent anesthesia complications

## 2014-01-23 NOTE — Transfer of Care (Signed)
Immediate Anesthesia Transfer of Care Note  Patient: [name] [name]  Procedure(s) Performed: Procedure(s): LAPAROSCOPY DIAGNOSTIC RIGHT PERITONEAL BIOPSY (N/A)  Patient Location: PACU  Anesthesia Type:General  Level of Consciousness: awake, alert , oriented and patient cooperative  Airway & Oxygen Therapy: Patient Spontanous Breathing and Patient connected to nasal cannula oxygen  Post-op Assessment: Report given to PACU RN and Post -op Vital signs reviewed and stable  Post vital signs: Reviewed and stable  Complications: No apparent anesthesia complications

## 2014-01-23 NOTE — Discharge Instructions (Signed)
DISCHARGE INSTRUCTIONS: Laparoscopy  The following instructions have been prepared to help you care for yourself upon your return home today.  MAY TAKE IBUPROFEN AFTER 2:18 P.M. AS NEEDED FOR PAIN  Wound care:  Do not get the incision wet for the first 24 hours. The incision should be kept clean and dry.  The Band-Aids or dressings may be removed the day after surgery.  Should the incision become sore, red, and swollen after the first week, check with your doctor.  Personal hygiene:  Shower the day after your procedure.  Activity and limitations:  Do NOT drive or operate any equipment today.  Do NOT lift anything more than 15 pounds for 2-3 weeks after surgery.  Do NOT rest in bed all day.  Walking is encouraged. Walk each day, starting slowly with 5-minute walks 3 or 4 times a day. Slowly increase the length of your walks.  Walk up and down stairs slowly.  Do NOT do strenuous activities, such as golfing, playing tennis, bowling, running, biking, weight lifting, gardening, mowing, or vacuuming for 2-4 weeks. Ask your doctor when it is okay to start.  Diet: Eat a light meal as desired this evening. You may resume your usual diet tomorrow.  Return to work: This is dependent on the type of work you do. For the most part you can return to a desk job within a week of surgery. If you are more active at work, please discuss this with your doctor.  What to expect after your surgery: You may have a slight burning sensation when you urinate on the first day. You may have a very small amount of blood in the urine. Expect to have a small amount of vaginal discharge/light bleeding for 1-2 weeks. It is not unusual to have abdominal soreness and bruising for up to 2 weeks. You may be tired and need more rest for about 1 week. You may experience shoulder pain for 24-72 hours. Lying flat in bed may relieve it.  Call your doctor for any of the following:  Develop a fever of 100.4 or greater   Inability to urinate 6 hours after discharge from hospital  Severe pain not relieved by pain medications  Persistent of heavy bleeding at incision site  Redness or swelling around incision site after a week  Increasing nausea or vomiting

## 2014-01-23 NOTE — Interval H&P Note (Signed)
History and Physical Interval Note:  01/23/2014 7:15 AM  [name] [name]  has presented today for surgery, with the diagnosis of Pelvic Pain,   The various methods of treatment have been discussed with the patient and family. After consideration of risks, benefits and other options for treatment, the patient has consented to  Procedure(s): LAPAROSCOPY DIAGNOSTIC/ POSSIBLE OPERATIVE LAPAROSCOPY (N/A) as a surgical intervention .  The patient's history has been reviewed, patient examined, no change in status, stable for surgery.  I have reviewed the patient's chart and labs.  Questions were answered to the patient's satisfaction.     Tylin Stradley D

## 2014-01-26 ENCOUNTER — Encounter (HOSPITAL_COMMUNITY): Payer: Self-pay | Admitting: Obstetrics and Gynecology

## 2014-02-16 ENCOUNTER — Encounter (HOSPITAL_COMMUNITY): Payer: Self-pay | Admitting: Obstetrics and Gynecology

## 2014-06-16 ENCOUNTER — Encounter (HOSPITAL_COMMUNITY): Payer: Self-pay

## 2014-06-16 ENCOUNTER — Encounter (HOSPITAL_COMMUNITY)
Admission: RE | Admit: 2014-06-16 | Discharge: 2014-06-16 | Disposition: A | Discharge status: Home / Self Care | Payer: Medicaid Other | Source: Ambulatory Visit | Attending: Obstetrics and Gynecology | Admitting: Obstetrics and Gynecology

## 2014-06-16 DIAGNOSIS — Z91013 Allergy to seafood: Secondary | ICD-10-CM | POA: Diagnosis not present

## 2014-06-16 DIAGNOSIS — Z881 Allergy status to other antibiotic agents status: Secondary | ICD-10-CM | POA: Diagnosis not present

## 2014-06-16 DIAGNOSIS — G8929 Other chronic pain: Secondary | ICD-10-CM | POA: Diagnosis present

## 2014-06-16 DIAGNOSIS — F17210 Nicotine dependence, cigarettes, uncomplicated: Secondary | ICD-10-CM | POA: Diagnosis not present

## 2014-06-16 DIAGNOSIS — D649 Anemia, unspecified: Secondary | ICD-10-CM | POA: Diagnosis not present

## 2014-06-16 DIAGNOSIS — F42 Obsessive-compulsive disorder: Secondary | ICD-10-CM | POA: Diagnosis not present

## 2014-06-16 DIAGNOSIS — F419 Anxiety disorder, unspecified: Secondary | ICD-10-CM | POA: Diagnosis not present

## 2014-06-16 DIAGNOSIS — R102 Pelvic and perineal pain: Secondary | ICD-10-CM | POA: Diagnosis not present

## 2014-06-16 DIAGNOSIS — Z91018 Allergy to other foods: Secondary | ICD-10-CM | POA: Diagnosis not present

## 2014-06-16 DIAGNOSIS — F4310 Post-traumatic stress disorder, unspecified: Secondary | ICD-10-CM | POA: Diagnosis not present

## 2014-06-16 LAB — CBC
HCT: 38.6 % (ref 36.0–46.0)
HEMOGLOBIN: 12.2 g/dL (ref 12.0–15.0)
MCH: 25 pg — AB (ref 26.0–34.0)
MCHC: 31.6 g/dL (ref 30.0–36.0)
MCV: 79.1 fL (ref 78.0–100.0)
Platelets: 300 10*3/uL (ref 150–400)
RBC: 4.88 MIL/uL (ref 3.87–5.11)
RDW: 14.4 % (ref 11.5–15.5)
WBC: 11.4 10*3/uL — ABNORMAL HIGH (ref 4.0–10.5)

## 2014-06-16 NOTE — H&P (Signed)
[name] [name] is an 24 y.o. female. She has been followed for chronic pelvic pain.  She has previously had laparoscopic BTL, normal pelvis then.  Had laparoscopy in October for pain, no source found.  Post-op she was treated with Lupron, pain is improved.  She has no interest in further pregnancy, has already had BTL, she requests definitive surgical therapy for pain.  Pertinent Gynecological History: Last pap: normal Date: 04/2012 per pt report OB History: G1, P0101 Induced SVD at 36 weeks-right hydronephrosis treated with nephrostomy tubes, developed right pleural effusion and was induced so she could have VATS   Menstrual History: No LMP recorded.    Past Medical History  Diagnosis Date  . Chronic pain     muscle spasms- entire body, pinch nerve in upper back  . Infection     UTI -Resolved - History only  . Fracture, ankle     left- x2 - resolved  . Post traumatic stress disorder (PTSD)   . OCD (obsessive compulsive disorder)   . SVD (spontaneous vaginal delivery) 12/2012    x 1 at [facility]  . PONV (postoperative nausea and vomiting)   . Heartburn     zantac prn  . Hydronephrosis     History  . Headache(784.0)     otc med prn  . Anemia   . History of blood transfusion 12/2012    [facility]  . Arthritis     knee, ankle, left wrist  . Depression     doing fine now  . Anxiety     from OCD    Past Surgical History  Procedure Laterality Date  . Nephrostomy tube placement at 25wks and removed at 335/7 Right   . Cystoscopy with stent placement Right 12/25/2012    Procedure: CYSTOSCOPY WITH STENT PLACEMENT;  Surgeon: [name] Matar, MD;  Location: [facility]S;  Service: Urology;  Laterality: Right;  . Video assisted thoracoscopy (vats)/decortication Right 01/08/2013    Procedure: VIDEO ASSISTED THORACOSCOPY DECORTICATION; Drainage of Pleural Effusion;  Surgeon: [name] Slot, MD;  [facility] OR;  Service: Thoracic;  Laterality: Right;  .  Laparoscopic bilateral salpingectomy Bilateral 03/31/2013    Procedure: LAPAROSCOPIC BILATERAL SALPINGECTOMY;  Surgeon: [name] Hamman, MD;  Location: [facility] ORS;  Service: Gynecology;  Laterality: Bilateral;  . Tubal ligation    . Wisdom tooth extraction  06/2013  . Laparoscopy N/A 01/23/2014    Procedure: LAPAROSCOPY DIAGNOSTIC RIGHT PERITONEAL BIOPSY;  Surgeon: [name] Hamman, MD;  Location: [facility] ORS;  Service: Gynecology;  Laterality: N/A;    Family History  Problem Relation Age of Onset  . Heart disease Mother   . Hypertension Father   . Cancer Paternal Grandmother     lung  . Heart disease Paternal Grandmother     Social History:  reports that she has been smoking Cigarettes.  She has a 2.25 pack-year smoking history. She has never used smokeless tobacco. She reports that she drinks alcohol. She reports that she does not use illicit drugs.  Allergies:  Allergies  Allergen Reactions  . Cinnamon Anaphylaxis and Hives  . Shellfish Allergy Anaphylaxis  . Augmentin [Amoxicillin-Pot Clavulanate] Nausea And Vomiting and Rash  . Nickel Rash    No prescriptions prior to admission    Review of Systems  Respiratory: Negative.   Cardiovascular: Negative.   Gastrointestinal: Negative.   Genitourinary: Negative.     not currently breastfeeding. Physical Exam  Constitutional: She appears well-developed and well-nourished.  Neck: Neck supple. No  thyromegaly present.  Cardiovascular: Normal rate, regular rhythm and normal heart sounds.   No murmur heard. Respiratory: Effort normal and breath sounds normal. No respiratory distress. She has no wheezes.  GI: Soft. She exhibits no distension and no mass. There is no tenderness.  Genitourinary: Vagina normal.  Uterus normal size, slightly tender No adnexal mass    Results for orders placed or performed during the hospital encounter of 06/17/14 (from the past 24 hour(s))  CBC     Status: Abnormal   Collection Time: 06/16/14  2:00 PM   Result Value Ref Range   WBC 11.4 (H) 4.0 - 10.5 K/uL   RBC 4.88 3.87 - 5.11 MIL/uL   Hemoglobin 12.2 12.0 - 15.0 g/dL   HCT 69.638.6 29.536.0 - 28.446.0 %   MCV 79.1 78.0 - 100.0 fL   MCH 25.0 (L) 26.0 - 34.0 pg   MCHC 31.6 30.0 - 36.0 g/dL   RDW 13.214.4 44.011.5 - 10.215.5 %   Platelets 300 150 - 400 K/uL    No results found.  Assessment/Plan: Chronic pelvic pain.  Doing ok on Lupron, but she would prefer to have definitive surgical therapy.  All medical and surgical options have been discussed.  She also wants her ovaries removed so she does not need any further surgery.  Discussed risks of surgery, surgical procedure, chance of relieving pain, risks specifically associated with removing her ovaries.  She understands the risks, wants to proceed with LAVH, bilateral oophorectomy, possible cystoscopy.  [name] [name] D 06/16/2014, 5:49 PM

## 2014-06-16 NOTE — Patient Instructions (Signed)
Your procedure is scheduled on:06/17/14  Enter through the Main Entrance at :6am Pick up desk phone and dial [phone] and inform us of your arrival.  Please call [phone] if you have any problems the morning of surgery.  Remember: Do not eat food or drink liquids, including water, after midnight:tonight    You may brush your teeth the morning of surgery.  Take these meds the morning of surgery with a sip of water:Ativan  DO NOT wear jewelry, eye make-up, lipstick,body lotion, or dark fingernail polish.  (Polished toes are ok) You may wear deodorant.  If you are to be admitted after surgery, leave suitcase in car until your room has been assigned. Patients discharged on the day of surgery will not be allowed to drive home. Wear loose fitting, comfortable clothes for your ride home.

## 2014-06-17 ENCOUNTER — Encounter (HOSPITAL_COMMUNITY): Payer: Self-pay | Admitting: Anesthesiology

## 2014-06-17 ENCOUNTER — Ambulatory Visit (HOSPITAL_COMMUNITY)
Admission: RE | Admit: 2014-06-17 | Discharge: 2014-06-17 | Disposition: A | Discharge status: Home / Self Care | Payer: Medicaid Other | Source: Ambulatory Visit | Attending: Obstetrics and Gynecology | Admitting: Obstetrics and Gynecology

## 2014-06-17 ENCOUNTER — Encounter (HOSPITAL_COMMUNITY)
Admission: RE | Disposition: A | Discharge status: Home / Self Care | Payer: Self-pay | Source: Ambulatory Visit | Attending: Obstetrics and Gynecology

## 2014-06-17 ENCOUNTER — Ambulatory Visit (HOSPITAL_COMMUNITY): Payer: Medicaid Other | Admitting: Anesthesiology

## 2014-06-17 DIAGNOSIS — G8929 Other chronic pain: Secondary | ICD-10-CM | POA: Insufficient documentation

## 2014-06-17 DIAGNOSIS — F4310 Post-traumatic stress disorder, unspecified: Secondary | ICD-10-CM | POA: Insufficient documentation

## 2014-06-17 DIAGNOSIS — F419 Anxiety disorder, unspecified: Secondary | ICD-10-CM | POA: Insufficient documentation

## 2014-06-17 DIAGNOSIS — F17210 Nicotine dependence, cigarettes, uncomplicated: Secondary | ICD-10-CM | POA: Insufficient documentation

## 2014-06-17 DIAGNOSIS — D649 Anemia, unspecified: Secondary | ICD-10-CM | POA: Diagnosis not present

## 2014-06-17 DIAGNOSIS — Z91013 Allergy to seafood: Secondary | ICD-10-CM | POA: Insufficient documentation

## 2014-06-17 DIAGNOSIS — F42 Obsessive-compulsive disorder: Secondary | ICD-10-CM | POA: Insufficient documentation

## 2014-06-17 DIAGNOSIS — Z881 Allergy status to other antibiotic agents status: Secondary | ICD-10-CM | POA: Insufficient documentation

## 2014-06-17 DIAGNOSIS — Z90710 Acquired absence of both cervix and uterus: Secondary | ICD-10-CM | POA: Diagnosis present

## 2014-06-17 DIAGNOSIS — Z91018 Allergy to other foods: Secondary | ICD-10-CM | POA: Insufficient documentation

## 2014-06-17 DIAGNOSIS — R102 Pelvic and perineal pain: Secondary | ICD-10-CM | POA: Diagnosis not present

## 2014-06-17 HISTORY — PX: LAPAROSCOPIC ASSISTED VAGINAL HYSTERECTOMY: SHX5398

## 2014-06-17 HISTORY — PX: OOPHORECTOMY: SHX6387

## 2014-06-17 LAB — PREGNANCY, URINE: PREG TEST UR: NEGATIVE

## 2014-06-17 SURGERY — HYSTERECTOMY, VAGINAL, LAPAROSCOPY-ASSISTED
Anesthesia: General

## 2014-06-17 MED ORDER — ROCURONIUM BROMIDE 100 MG/10ML IV SOLN
INTRAVENOUS | Status: AC
  Filled 2014-06-17: qty 1

## 2014-06-17 MED ORDER — ROCURONIUM BROMIDE 100 MG/10ML IV SOLN
INTRAVENOUS | Status: DC | PRN
  Administered 2014-06-17: 40 mg via INTRAVENOUS

## 2014-06-17 MED ORDER — HYDROMORPHONE HCL 2 MG PO TABS
2.0000 mg | ORAL_TABLET | ORAL | Status: DC | PRN
  Administered 2014-06-17: 2 mg via ORAL
  Filled 2014-06-17: qty 1

## 2014-06-17 MED ORDER — KETOROLAC TROMETHAMINE 30 MG/ML IJ SOLN
INTRAMUSCULAR | Status: AC
  Filled 2014-06-17: qty 1

## 2014-06-17 MED ORDER — FAMOTIDINE 20 MG PO TABS: 20.0000 mg | ORAL_TABLET | Freq: Every day | ORAL | Status: DC

## 2014-06-17 MED ORDER — BUPIVACAINE HCL 0.5 % IJ SOLN
INTRAMUSCULAR | Status: DC | PRN
  Administered 2014-06-17: 30 mL

## 2014-06-17 MED ORDER — IBUPROFEN 600 MG PO TABS: 600.0000 mg | ORAL_TABLET | Freq: Four times a day (QID) | ORAL | Status: DC | PRN

## 2014-06-17 MED ORDER — VASOPRESSIN 20 UNIT/ML IV SOLN
INTRAVENOUS | Status: AC
  Filled 2014-06-17: qty 1

## 2014-06-17 MED ORDER — LIDOCAINE HCL (CARDIAC) 20 MG/ML IV SOLN
INTRAVENOUS | Status: DC | PRN
  Administered 2014-06-17: 70 mg via INTRAVENOUS
  Administered 2014-06-17: 30 mg via INTRAVENOUS

## 2014-06-17 MED ORDER — CEFAZOLIN SODIUM-DEXTROSE 2-3 GM-% IV SOLR
INTRAVENOUS | Status: AC
  Filled 2014-06-17: qty 50

## 2014-06-17 MED ORDER — MIDAZOLAM HCL 2 MG/2ML IJ SOLN
INTRAMUSCULAR | Status: DC | PRN
  Administered 2014-06-17: 2 mg via INTRAVENOUS

## 2014-06-17 MED ORDER — 0.9 % SODIUM CHLORIDE (POUR BTL) OPTIME
TOPICAL | Status: DC | PRN
  Administered 2014-06-17: 1000 mL

## 2014-06-17 MED ORDER — ONDANSETRON HCL 4 MG PO TABS: 4.0000 mg | ORAL_TABLET | Freq: Four times a day (QID) | ORAL | Status: DC | PRN

## 2014-06-17 MED ORDER — HEPARIN SODIUM (PORCINE) 5000 UNIT/ML IJ SOLN
INTRAMUSCULAR | Status: AC
  Filled 2014-06-17: qty 1

## 2014-06-17 MED ORDER — LORAZEPAM 1 MG PO TABS: 0.5000 mg | ORAL_TABLET | Freq: Two times a day (BID) | ORAL | Status: DC

## 2014-06-17 MED ORDER — PROPOFOL 10 MG/ML IV BOLUS
INTRAVENOUS | Status: AC
  Filled 2014-06-17: qty 20

## 2014-06-17 MED ORDER — SODIUM CHLORIDE 0.9 % IJ SOLN
INTRAMUSCULAR | Status: AC
  Filled 2014-06-17: qty 50

## 2014-06-17 MED ORDER — FENTANYL CITRATE 0.05 MG/ML IJ SOLN
INTRAMUSCULAR | Status: AC
  Filled 2014-06-17: qty 5

## 2014-06-17 MED ORDER — ALUM & MAG HYDROXIDE-SIMETH 200-200-20 MG/5ML PO SUSP: 30.0000 mL | ORAL | Status: DC | PRN

## 2014-06-17 MED ORDER — NEOSTIGMINE METHYLSULFATE 10 MG/10ML IV SOLN
INTRAVENOUS | Status: DC | PRN
  Administered 2014-06-17: 3 mg via INTRAVENOUS

## 2014-06-17 MED ORDER — SIMETHICONE 80 MG PO CHEW: 80.0000 mg | CHEWABLE_TABLET | Freq: Four times a day (QID) | ORAL | Status: DC | PRN

## 2014-06-17 MED ORDER — BUPIVACAINE HCL (PF) 0.25 % IJ SOLN
INTRAMUSCULAR | Status: DC | PRN
  Administered 2014-06-17: 12 mL

## 2014-06-17 MED ORDER — ONDANSETRON HCL 4 MG/2ML IJ SOLN
INTRAMUSCULAR | Status: DC | PRN
  Administered 2014-06-17: 4 mg via INTRAVENOUS

## 2014-06-17 MED ORDER — BUPIVACAINE HCL (PF) 0.25 % IJ SOLN
INTRAMUSCULAR | Status: AC
  Filled 2014-06-17: qty 30

## 2014-06-17 MED ORDER — MEPERIDINE HCL 25 MG/ML IJ SOLN: 6.2500 mg | INTRAMUSCULAR | Status: DC | PRN

## 2014-06-17 MED ORDER — KETOROLAC TROMETHAMINE 30 MG/ML IJ SOLN: 30.0000 mg | Freq: Four times a day (QID) | INTRAMUSCULAR | Status: DC

## 2014-06-17 MED ORDER — HYDROMORPHONE HCL 1 MG/ML IJ SOLN
INTRAMUSCULAR | Status: AC
  Filled 2014-06-17: qty 1

## 2014-06-17 MED ORDER — GLYCOPYRROLATE 0.2 MG/ML IJ SOLN
INTRAMUSCULAR | Status: DC | PRN
  Administered 2014-06-17: 0.6 mg via INTRAVENOUS

## 2014-06-17 MED ORDER — HYDROMORPHONE HCL 1 MG/ML IJ SOLN
0.2500 mg | INTRAMUSCULAR | Status: DC | PRN
  Administered 2014-06-17 (×4): 0.5 mg via INTRAVENOUS

## 2014-06-17 MED ORDER — SCOPOLAMINE 1 MG/3DAYS TD PT72
MEDICATED_PATCH | TRANSDERMAL | Status: AC
  Administered 2014-06-17: 1.5 mg via TRANSDERMAL
  Filled 2014-06-17: qty 1

## 2014-06-17 MED ORDER — MIDAZOLAM HCL 2 MG/2ML IJ SOLN
INTRAMUSCULAR | Status: AC
  Filled 2014-06-17: qty 2

## 2014-06-17 MED ORDER — ONDANSETRON HCL 4 MG/2ML IJ SOLN
INTRAMUSCULAR | Status: AC
  Filled 2014-06-17: qty 2

## 2014-06-17 MED ORDER — ONDANSETRON HCL 4 MG/2ML IJ SOLN: 4.0000 mg | Freq: Four times a day (QID) | INTRAMUSCULAR | Status: DC | PRN

## 2014-06-17 MED ORDER — GLYCOPYRROLATE 0.2 MG/ML IJ SOLN
INTRAMUSCULAR | Status: AC
  Filled 2014-06-17: qty 3

## 2014-06-17 MED ORDER — FENTANYL CITRATE 0.05 MG/ML IJ SOLN
INTRAMUSCULAR | Status: DC | PRN
  Administered 2014-06-17 (×5): 50 ug via INTRAVENOUS

## 2014-06-17 MED ORDER — VASOPRESSIN 20 UNIT/ML IV SOLN
INTRAVENOUS | Status: DC | PRN
  Administered 2014-06-17: 16 mL via INTRAMUSCULAR
  Administered 2014-06-17: 08:00:00 via INTRAMUSCULAR

## 2014-06-17 MED ORDER — SCOPOLAMINE 1 MG/3DAYS TD PT72
1.0000 | MEDICATED_PATCH | Freq: Once | TRANSDERMAL | Status: DC
  Administered 2014-06-17: 1.5 mg via TRANSDERMAL

## 2014-06-17 MED ORDER — OXYCODONE-ACETAMINOPHEN 5-325 MG PO TABS
1.0000 | ORAL_TABLET | ORAL | Status: DC | PRN
  Administered 2014-06-17: 2 via ORAL
  Filled 2014-06-17 (×2): qty 2

## 2014-06-17 MED ORDER — KETOROLAC TROMETHAMINE 30 MG/ML IJ SOLN: 30.0000 mg | Freq: Once | INTRAMUSCULAR | Status: DC

## 2014-06-17 MED ORDER — PROPOFOL 10 MG/ML IV BOLUS
INTRAVENOUS | Status: DC | PRN
  Administered 2014-06-17: 200 mg via INTRAVENOUS

## 2014-06-17 MED ORDER — STERILE WATER FOR IRRIGATION IR SOLN: Status: DC | PRN

## 2014-06-17 MED ORDER — HYDROMORPHONE HCL 2 MG PO TABS: 2.0000 mg | ORAL_TABLET | ORAL | Status: DC | PRN

## 2014-06-17 MED ORDER — MENTHOL 3 MG MT LOZG: 1.0000 | LOZENGE | OROMUCOSAL | Status: DC | PRN

## 2014-06-17 MED ORDER — DEXAMETHASONE SODIUM PHOSPHATE 10 MG/ML IJ SOLN
INTRAMUSCULAR | Status: DC | PRN
  Administered 2014-06-17: 4 mg via INTRAVENOUS

## 2014-06-17 MED ORDER — BUPIVACAINE HCL (PF) 0.5 % IJ SOLN
INTRAMUSCULAR | Status: AC
  Filled 2014-06-17: qty 30

## 2014-06-17 MED ORDER — DEXAMETHASONE SODIUM PHOSPHATE 4 MG/ML IJ SOLN
INTRAMUSCULAR | Status: AC
  Filled 2014-06-17: qty 1

## 2014-06-17 MED ORDER — LACTATED RINGERS IV SOLN
INTRAVENOUS | Status: DC
  Administered 2014-06-17 (×2): via INTRAVENOUS

## 2014-06-17 MED ORDER — CEFAZOLIN SODIUM-DEXTROSE 2-3 GM-% IV SOLR
2.0000 g | INTRAVENOUS | Status: AC
Start: 2014-06-17 — End: 2014-06-17
  Administered 2014-06-17: 2 g via INTRAVENOUS

## 2014-06-17 MED ORDER — LIDOCAINE HCL (CARDIAC) 20 MG/ML IV SOLN
INTRAVENOUS | Status: AC
  Filled 2014-06-17: qty 5

## 2014-06-17 MED ORDER — KETOROLAC TROMETHAMINE 30 MG/ML IJ SOLN
30.0000 mg | Freq: Four times a day (QID) | INTRAMUSCULAR | Status: DC
  Administered 2014-06-17: 30 mg via INTRAVENOUS
  Filled 2014-06-17: qty 1

## 2014-06-17 MED ORDER — PROMETHAZINE HCL 25 MG/ML IJ SOLN: 6.2500 mg | INTRAMUSCULAR | Status: DC | PRN

## 2014-06-17 MED ORDER — DEXTROSE-NACL 5-0.45 % IV SOLN
INTRAVENOUS | Status: DC
  Administered 2014-06-17: 10:00:00 via INTRAVENOUS

## 2014-06-17 MED ORDER — KETOROLAC TROMETHAMINE 30 MG/ML IJ SOLN
INTRAMUSCULAR | Status: DC | PRN
  Administered 2014-06-17: 30 mg via INTRAVENOUS

## 2014-06-17 SURGICAL SUPPLY — 39 items
CANISTER SUCT 3000ML (MISCELLANEOUS) ×3 IMPLANT
CATH ROBINSON RED A/P 16FR (CATHETERS) ×3 IMPLANT
CLOTH BEACON ORANGE TIMEOUT ST (SAFETY) ×3 IMPLANT
CONT PATH 16OZ SNAP LID 3702 (MISCELLANEOUS) ×3 IMPLANT
COVER BACK TABLE 60X90IN (DRAPES) ×3 IMPLANT
DECANTER SPIKE VIAL GLASS SM (MISCELLANEOUS) ×9 IMPLANT
DRAPE HYSTEROSCOPY (DRAPE) ×3 IMPLANT
DRSG COVADERM PLUS 2X2 (GAUZE/BANDAGES/DRESSINGS) ×4 IMPLANT
DRSG OPSITE POSTOP 3X4 (GAUZE/BANDAGES/DRESSINGS) IMPLANT
DURAPREP 26ML APPLICATOR (WOUND CARE) ×3 IMPLANT
ELECT REM PT RETURN 9FT ADLT (ELECTROSURGICAL) ×3
ELECTRODE REM PT RTRN 9FT ADLT (ELECTROSURGICAL) IMPLANT
GLOVE BIO SURGEON STRL SZ8 (GLOVE) ×3 IMPLANT
GLOVE BIOGEL PI IND STRL 6.5 (GLOVE) ×2 IMPLANT
GLOVE BIOGEL PI IND STRL 7.0 (GLOVE) ×4 IMPLANT
GLOVE BIOGEL PI INDICATOR 6.5 (GLOVE) ×1
GLOVE BIOGEL PI INDICATOR 7.0 (GLOVE) ×2
GLOVE ORTHO TXT STRL SZ7.5 (GLOVE) ×6 IMPLANT
GOWN STRL REUS W/TWL LRG LVL3 (GOWN DISPOSABLE) ×6 IMPLANT
LIQUID BAND (GAUZE/BANDAGES/DRESSINGS) ×3 IMPLANT
NEEDLE INSUFFLATION 120MM (ENDOMECHANICALS) ×3 IMPLANT
NS IRRIG 1000ML POUR BTL (IV SOLUTION) ×3 IMPLANT
PACK LAVH (CUSTOM PROCEDURE TRAY) ×3 IMPLANT
PACK ROBOTIC GOWN (GOWN DISPOSABLE) ×3 IMPLANT
PAD POSITIONER PINK NONSTERILE (MISCELLANEOUS) ×3 IMPLANT
PROTECTOR NERVE ULNAR (MISCELLANEOUS) ×6 IMPLANT
SET CYSTO W/LG BORE CLAMP LF (SET/KITS/TRAYS/PACK) ×3 IMPLANT
SET IRRIG TUBING LAPAROSCOPIC (IRRIGATION / IRRIGATOR) ×3 IMPLANT
SHEARS HARMONIC ACE PLUS 36CM (ENDOMECHANICALS) ×3 IMPLANT
SUT CHROMIC 1MO 4 18 CR8 (SUTURE) ×6 IMPLANT
SUT CHROMIC GUT AB #0 18 (SUTURE) ×3 IMPLANT
SUT SILK 2 0 SH (SUTURE) ×3 IMPLANT
SUT VIC AB 2-0 CT1 (SUTURE) ×3 IMPLANT
SUT VICRYL 4-0 PS2 18IN ABS (SUTURE) ×3 IMPLANT
TOWEL OR 17X24 6PK STRL BLUE (TOWEL DISPOSABLE) ×6 IMPLANT
TRAY FOLEY CATH 14FR (SET/KITS/TRAYS/PACK) ×3 IMPLANT
TROCAR XCEL NON-BLD 5MMX100MML (ENDOMECHANICALS) ×9 IMPLANT
WARMER LAPAROSCOPE (MISCELLANEOUS) ×3 IMPLANT
WATER STERILE IRR 1000ML POUR (IV SOLUTION) ×3 IMPLANT

## 2014-06-17 NOTE — Anesthesia Postprocedure Evaluation (Signed)
  Anesthesia Post-op Note  Patient: [name] [name]  Procedure(s) Performed: Procedure(s): LAPAROSCOPIC ASSISTED VAGINAL HYSTERECTOMY (N/A) OOPHORECTOMY (Bilateral)  Patient Location: PACU and Women's Unit  Anesthesia Type:General  Level of Consciousness: awake, alert  and oriented  Airway and Oxygen Therapy: Patient Spontanous Breathing  Post-op Pain: mild  Post-op Assessment: Post-op Vital signs reviewed, Patient's Cardiovascular Status Stable, Respiratory Function Stable, No signs of Nausea or vomiting, Adequate PO intake and Pain level controlled  Post-op Vital Signs: Reviewed and stable  Last Vitals:  Filed Vitals:   06/17/14 1545  BP: 118/63  Pulse: 69  Temp: 36.9 C  Resp: 18    Complications: No apparent anesthesia complications

## 2014-06-17 NOTE — Progress Notes (Signed)
Pt is discharged in the care of father with R.N. Escort. Denies any pain  Or discomfort. Abdominal incisions are clean and dry. Discharged instructions with Rx were given to pt. Questions were asked and answered. No equipment needed for home use.Stable.

## 2014-06-17 NOTE — Discharge Instructions (Signed)
Routine instructions for hysterectomy °

## 2014-06-17 NOTE — Op Note (Signed)
Preoperative diagnosis:  Chronic pelvic pain Postoperative diagnosis:  Same  Procedure: Laparoscopic-assisted vaginal hysterectomy, bilateral oophorectomy Surgeon: Lavina Hammanodd Micky Sheller M.D. Assistant: Sherian ReinJody Bovard-Stuckert, MD. Anesthesia: Gen. Endotracheal tube Findings: She had a normal upper abdomen and normal pelvis with prior bilateral salpingectomy.  No source for pain identified.  Specimens: Uterus and ovaries sent for routine pathology Complications: None  Procedure in detail: The patient was taken to the operating room and placed in the dorsosupine position. General anesthesia was induced and legs were placed in mobile stirrups and arms tucked to her sides. Abdomen and perineum were then prepped and draped in usual sterile fashion, bladder drained with a red Robinson catheter, Hulka tenaculum applied to the cervix for uterine manipulation. Infraumbilical skin was then infiltrated with quarter percent Marcaine and a 1 cm vertical incision was made. Veress needle was inserted into the peritoneal cavity and placement confirmed by the water drop test an opening pressure of 6 mm mercury. CO2 was insufflated to a pressure of 12 mm mercury and the Veress needle was removed. A 5 mm trocar was introduced with direct visualization. A 5 mm port was then placed on each side also under direct visualization. Inspection revealed the above-mentioned findings. The right ovary was grasped, and the Harmonic Scalpel ACE was then used to take down the right IP pedicle. We then came through the right round ligament, right broad ligament and incised the anterior peritoneum across the anterior lower part of the uterus. A similar procedure was then performed on the left side taking down the IP pedicle, round ligament, broad ligament. Anterior peritoneum was incised across the anterior part of the uterus to meet the incision coming from the right side. At this point the uterus was fairly free and there is adequate hemostasis to  proceed vaginally.  The legs were elevated in stirrups. A weighted speculum was inserted in the vagina. The cervix was grasped with Christella HartiganJacobs tenaculum. A dilute solution of Pitressin and Marcaine was infiltrated around the cervicovaginal junction which was then incised circumferentially with electrocautery. Sharp dissection was then used to further free the vagina from the cervix. Anterior peritoneum was identified and entered sharply. A Deaver retractor was then to retract the bladder anterior. Posterior cul-de-sac was identified and entered sharply. A Bonnano speculum was placed into the posterior cul-de-sac. Uterosacral ligaments were clamped transected and ligated with #1 chromic and tagged for later use.  The remaining pedicles were taken down with the short Harmonic scalpel. Bleeding from the right side was controlled with on figure 8 suture of #1 Chromic. The uterosacral ligaments were plicated in the midline with 2-0 silk and the previously tagged uterosacral pedicles were also tied in the midline. An extra suture of #1 Chromic was passed from the vagina through the left uterosacral pedicle, across the posterior peritoneum and then through the right uterosacral and back into the vagina.  This pedicle was tagged and tied after the vagina was closed.The vaginal cuff was then closed in a vertical fashion with running locking 2-0 Vicryl with adequate closure and adequate hemostasis. Bladder was drained and the catheter was removed.  Attention was turned back to the abdomen. Dr. Hinton RaoBovard-Stuckert and the scrub tech and myself all changed gowns and gloves. The abdomen was reinsufflated. Laparoscope was reinserted and good visualization was achieved. No significant bleeding was identified. Both ureters were identified and found to be below incision lines. The 5 mm ports on the each side were removed under direct visualization. The laparoscope was then removed and all  gas was allowed to deflate from the abdomen. The  umbilical trocar was then removed. Skin incisions were closed with interrupted subcuticular sutures of 4-0 Vicryl followed by Liqui-band. The patient was awakened in the operating room and taken to the recovery room in stable condition after tolerating the procedure well. Counts were correct x2, she received Ancef 2 g IV the beginning of the procedure, she had PAS hose on throughout the procedure.

## 2014-06-17 NOTE — Transfer of Care (Signed)
Immediate Anesthesia Transfer of Care Note  Patient: [name] JarvisSherry L [name]  Procedure(s) Performed: Procedure(s): LAPAROSCOPIC ASSISTED VAGINAL HYSTERECTOMY (N/A) OOPHORECTOMY (Bilateral)  Patient Location: PACU  Anesthesia Type:General  Level of Consciousness: awake, alert , oriented and patient cooperative  Airway & Oxygen Therapy: Patient Spontanous Breathing and Patient connected to nasal cannula oxygen  Post-op Assessment: Report given to RN, Post -op Vital signs reviewed and stable and Patient moving all extremities  Post vital signs: Reviewed and stable  Last Vitals: There were no vitals filed for this visit.  Complications: No apparent anesthesia complications

## 2014-06-17 NOTE — Anesthesia Procedure Notes (Signed)
Procedure Name: Intubation Date/Time: 06/17/2014 7:30 AM Performed by: Donnalee CurryMALINOVA, Gilbert Manolis HRISTOVA Pre-anesthesia Checklist: Patient identified, Emergency Drugs available, Suction available and Patient being monitored Patient Re-evaluated:Patient Re-evaluated prior to inductionOxygen Delivery Method: Circle system utilized Preoxygenation: Pre-oxygenation with 100% oxygen Intubation Type: IV induction Ventilation: Mask ventilation without difficulty Laryngoscope Size: Mac and 3 Grade View: Grade I Tube type: Oral Number of attempts: 1 Airway Equipment and Method: Stylet Placement Confirmation: ETT inserted through vocal cords under direct vision,  positive ETCO2 and breath sounds checked- equal and bilateral Secured at: 20 cm Dental Injury: Teeth and Oropharynx as per pre-operative assessment

## 2014-06-17 NOTE — Addendum Note (Signed)
Addendum  created 06/17/14 1626 by Elbert Ewingsolleen S Dekota Shenk, CRNA   Modules edited: Notes Section   Notes Section:  File: [id number]

## 2014-06-17 NOTE — Anesthesia Preprocedure Evaluation (Signed)
Anesthesia Evaluation  Patient identified by MRN, date of birth, ID band Patient awake    Reviewed: Allergy & Precautions, H&P , NPO status , Patient's Chart, lab work & pertinent test results  Airway Mallampati: I  TM Distance: >3 FB Neck ROM: full    Dental no notable dental hx. (+) Teeth Intact   Pulmonary Current Smoker,    Pulmonary exam normal       Cardiovascular negative cardio ROS      Neuro/Psych    GI/Hepatic negative GI ROS, Neg liver ROS,   Endo/Other  negative endocrine ROS  Renal/GU      Musculoskeletal   Abdominal Normal abdominal exam  (+)   Peds  Hematology negative hematology ROS (+)   Anesthesia Other Findings   Reproductive/Obstetrics negative OB ROS                             Anesthesia Physical Anesthesia Plan  ASA: II  Anesthesia Plan: General   Post-op Pain Management:    Induction: Intravenous  Airway Management Planned: Oral ETT  Additional Equipment:   Intra-op Plan:   Post-operative Plan: Extubation in OR  Informed Consent: I have reviewed the patients History and Physical, chart, labs and discussed the procedure including the risks, benefits and alternatives for the proposed anesthesia with the patient or authorized representative who has indicated his/her understanding and acceptance.   Dental Advisory Given  Plan Discussed with: CRNA, Surgeon and Anesthesiologist  Anesthesia Plan Comments:         Anesthesia Quick Evaluation

## 2014-06-17 NOTE — Interval H&P Note (Signed)
History and Physical Interval Note:  06/17/2014 7:09 AM  Isabell JarvisSherry L XXXIdol  has presented today for surgery, with the diagnosis of Chronic Pelvic Pain   The various methods of treatment have been discussed with the patient and family. After consideration of risks, benefits and other options for treatment, the patient has consented to  Procedure(s): LAPAROSCOPIC ASSISTED VAGINAL HYSTERECTOMY (N/A) OOPHORECTOMY (Bilateral) CYSTOSCOPY (N/A) as a surgical intervention .  The patient's history has been reviewed, patient examined, no change in status, stable for surgery.  I have reviewed the patient's chart and labs.  Questions were answered to the patient's satisfaction.     Shawnetta Lein D

## 2014-06-17 NOTE — Anesthesia Postprocedure Evaluation (Signed)
  Anesthesia Post Note  Patient: [name] JarvisSherry L XXXIdol  Procedure(s) Performed: Procedure(s) (LRB): LAPAROSCOPIC ASSISTED VAGINAL HYSTERECTOMY (N/A) OOPHORECTOMY (Bilateral)  Anesthesia type: GA  Patient location: PACU  Post pain: Pain level controlled  Post assessment: Post-op Vital signs reviewed  Last Vitals:  Filed Vitals:   06/17/14 1000  BP:   Pulse:   Temp: 36.4 C  Resp:     Post vital signs: Reviewed  Level of consciousness: sedated  Complications: No apparent anesthesia complications

## 2014-06-17 NOTE — Progress Notes (Signed)
Post-op check Doing well, would like something different for pain but would like to go home Afeb, VSS, adequate urine Abd- soft, dressings C/D/I Will change pain med to dilaudid and d/c home

## 2014-06-18 MED FILL — Heparin Sodium (Porcine) Inj 5000 Unit/ML: INTRAMUSCULAR | Qty: 1 | Status: AC

## 2014-06-18 NOTE — Discharge Summary (Signed)
Physician Discharge Summary  Patient ID: [name] [name] MRN: [id number] DOB/AGE: [birth year] 23 y.o.  Admit date: 06/17/2014 Discharge date: 06/17/2014  Admission Diagnoses:  Chronic pelvic pain  Discharge Diagnoses: Same Active Problems:   S/P hysterectomy   Discharged Condition: good  Hospital Course: Underwent LAVH, bilateral oophorectomy without complications, stable for discharge night of surgery   Discharge Exam: Blood pressure 118/63, pulse 69, temperature 98.5 F (36.9 C), temperature source Oral, resp. rate 18, SpO2 97 %, not currently breastfeeding. General appearance: alert  Disposition: 01-Home or Self Care  Discharge Instructions    Diet - low sodium heart healthy    Complete by:  As directed      Increase activity slowly    Complete by:  As directed      Lifting restrictions    Complete by:  As directed   10 lbs     Sexual Activity Restrictions    Complete by:  As directed   Pelvic rest            Medication List    STOP taking these medications        leuprolide 11.25 MG injection  Commonly known as:  LUPRON     oxyCODONE 5 MG immediate release tablet  Commonly known as:  ROXICODONE      TAKE these medications        HYDROmorphone 2 MG tablet  Commonly known as:  DILAUDID  Take 1 tablet (2 mg total) by mouth every 3 (three) hours as needed for severe pain.     ibuprofen 600 MG tablet  Commonly known as:  ADVIL,MOTRIN  Take 1 tablet (600 mg total) by mouth every 6 (six) hours as needed (mild pain).     LORazepam 0.5 MG tablet  Commonly known as:  ATIVAN  Take 0.5 mg by mouth 2 (two) times daily.     ranitidine 150 MG tablet  Commonly known as:  ZANTAC  Take 150 mg by mouth daily as needed for heartburn.           Follow-up Information    Follow up with Naoki Migliaccio D, MD. Schedule an appointment as soon as possible for a visit in 2 weeks.   Specialty:  Obstetrics and Gynecology   Contact information:   [address],  [address] [place] [id number] [phone]       Signed: Zenaida NieceMEISINGER,Eltha Tingley D 06/18/2014, 8:20 AM

## 2014-06-19 ENCOUNTER — Encounter (HOSPITAL_COMMUNITY): Payer: Self-pay | Admitting: Obstetrics and Gynecology

## 2014-09-08 IMAGING — US IR EVAL AND MANAGEMENT
1 series · 4 of 4 positions shown · non-contrast
Comparison: none

[Series 1: sp perc tube change w/cm · 4 of 4 slices shown]
[im 1/4]
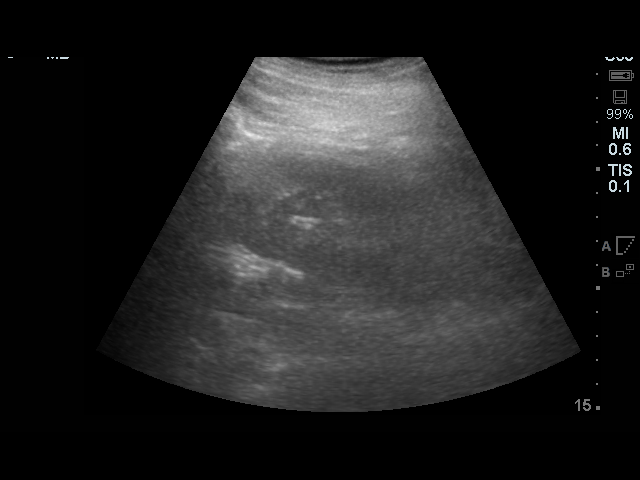
[im 2/4]
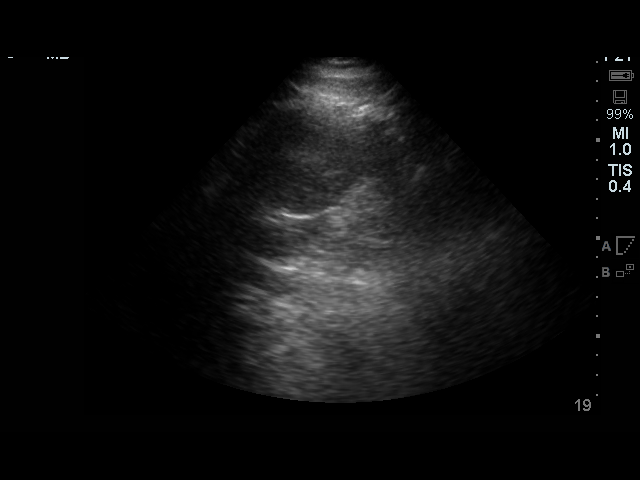
[im 3/4]
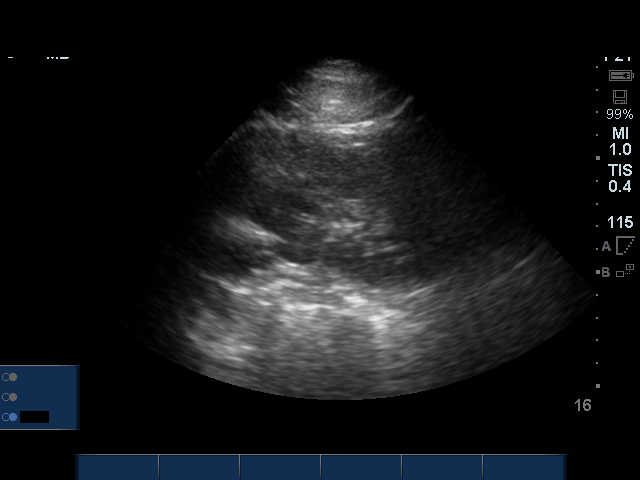
[im 4/4]
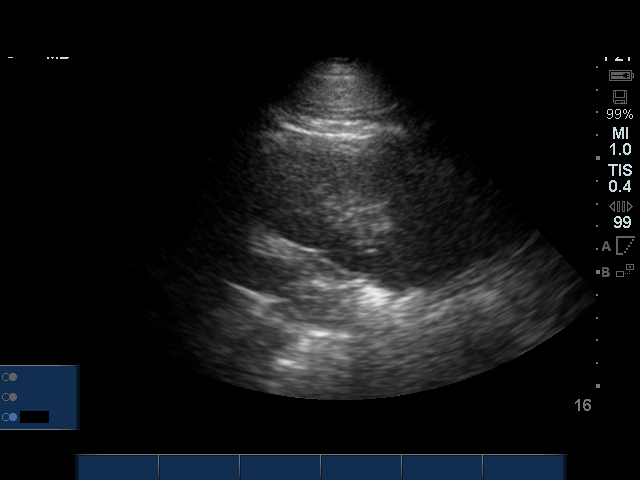

[4 of 4 positions shown; findings below may reference images not displayed]

ESTABLISHED PATIENT HOSPITAL VISIT LEVEL II

Ms. Tarla comes to [facility] in the evaluation of the nephrostomy because
of increasing pain.  She describes no fevers or chills.  She does
have some right flank pain.  There is pain no hematuria today.  The
nephrostomy has been draining urine adequately.

She is and no apparent distress.  The nephrostomy site is clean and
dry without signs of infection.  There is some debris within the
drainage tube however the urine is clear and yellow.  There is no
hematuria.  There is no cloudiness.  There is no follow-through.
There is no costovertebral angle tenderness.

Bedside ultrasound demonstrates no gross hydronephrosis.

In summary, the nephrostomy tube is functioning well and there are
no obvious signs of infection.  The tube was flushed easily and
urine was easily aspirated.  She was instructed to continue her
current care and return for her normal follow up appointment for
exchange.

## 2014-10-19 IMAGING — CR DG CHEST 1V
1 series · 1 of 1 positions shown · non-contrast
Comparison: 12/30/2012

CLINICAL DATA: Post thoracentesis.

EXAM:
CHEST - 1 VIEW

[x chest ap]
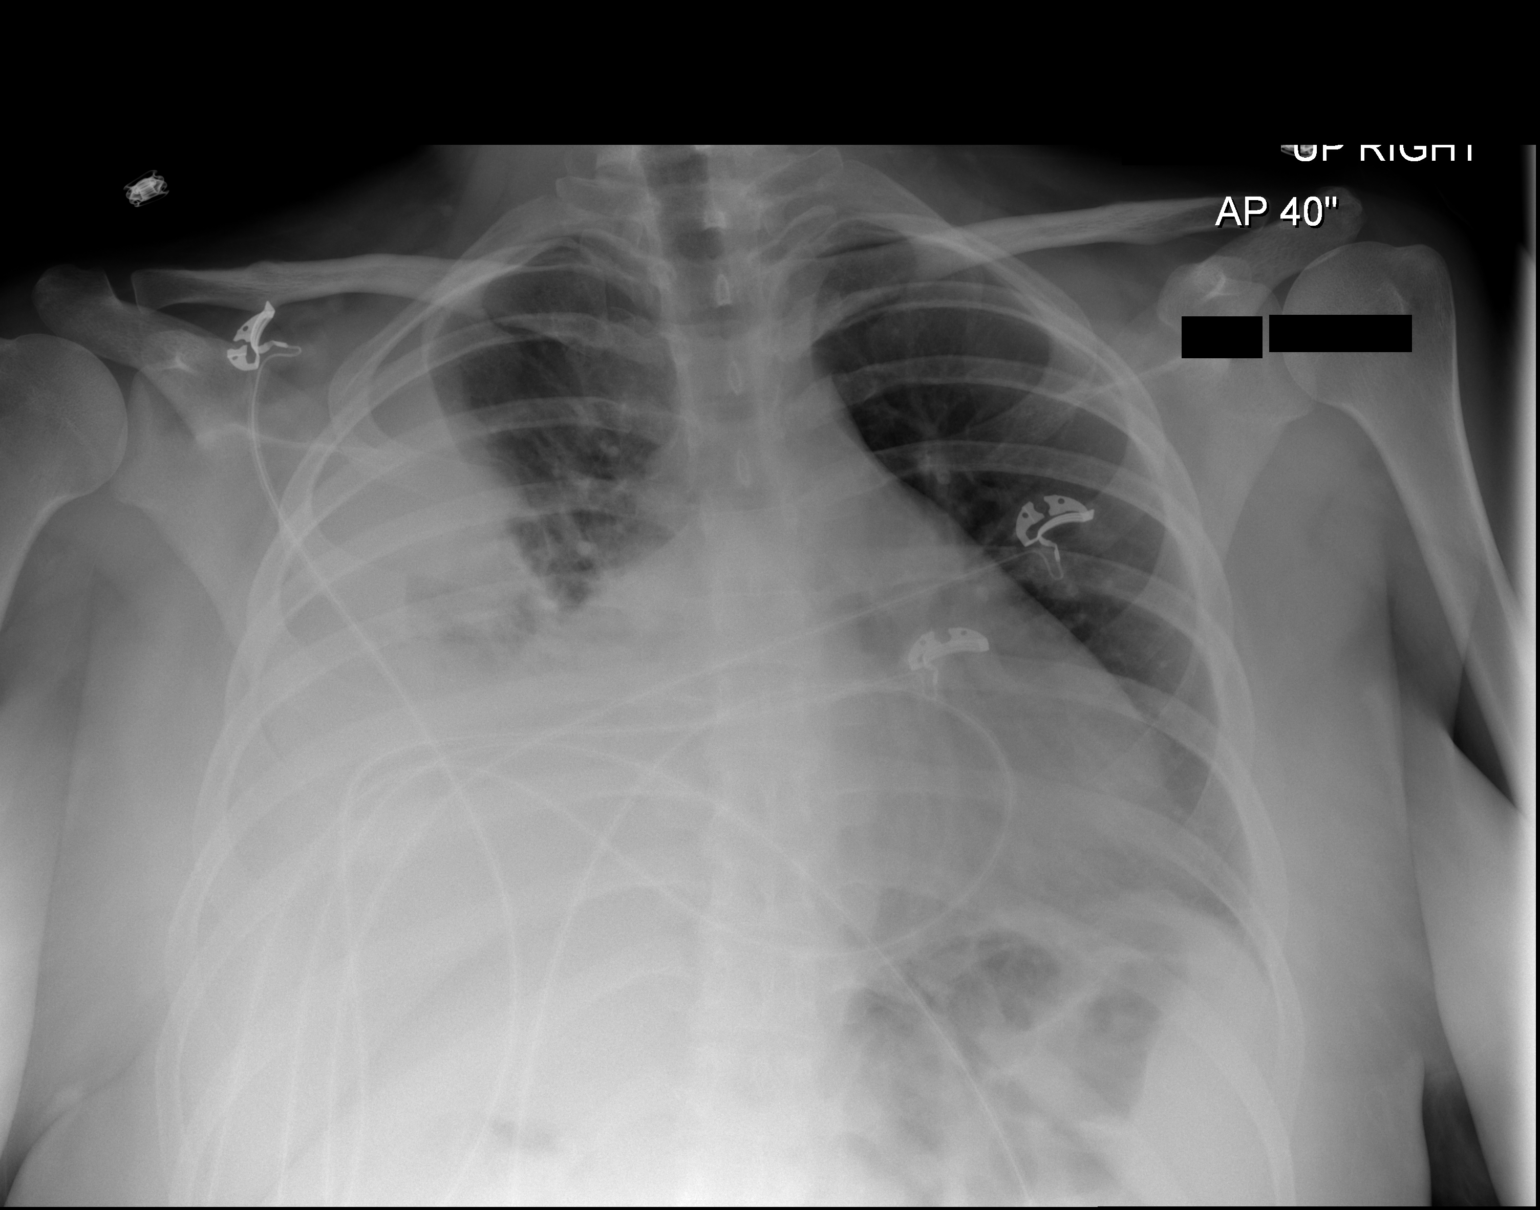

[1 of 1 positions shown; findings below may reference images not displayed]

FINDINGS: Large right pleural effusion persists, not significantly changed. No
visible pneumothorax. Right lower lobe atelectasis or consolidation.
Cardiomegaly. No confluent opacity on the left.
IMPRESSION: Continued large right pleural effusion. No pneumothorax following
thoracentesis.

## 2014-10-28 IMAGING — CR DG CHEST 1V PORT
1 series · 1 of 1 positions shown · non-contrast
Comparison: 01/08/2013.

CLINICAL DATA: Post right lung surgery.

EXAM:
PORTABLE CHEST - 1 VIEW

[AP]
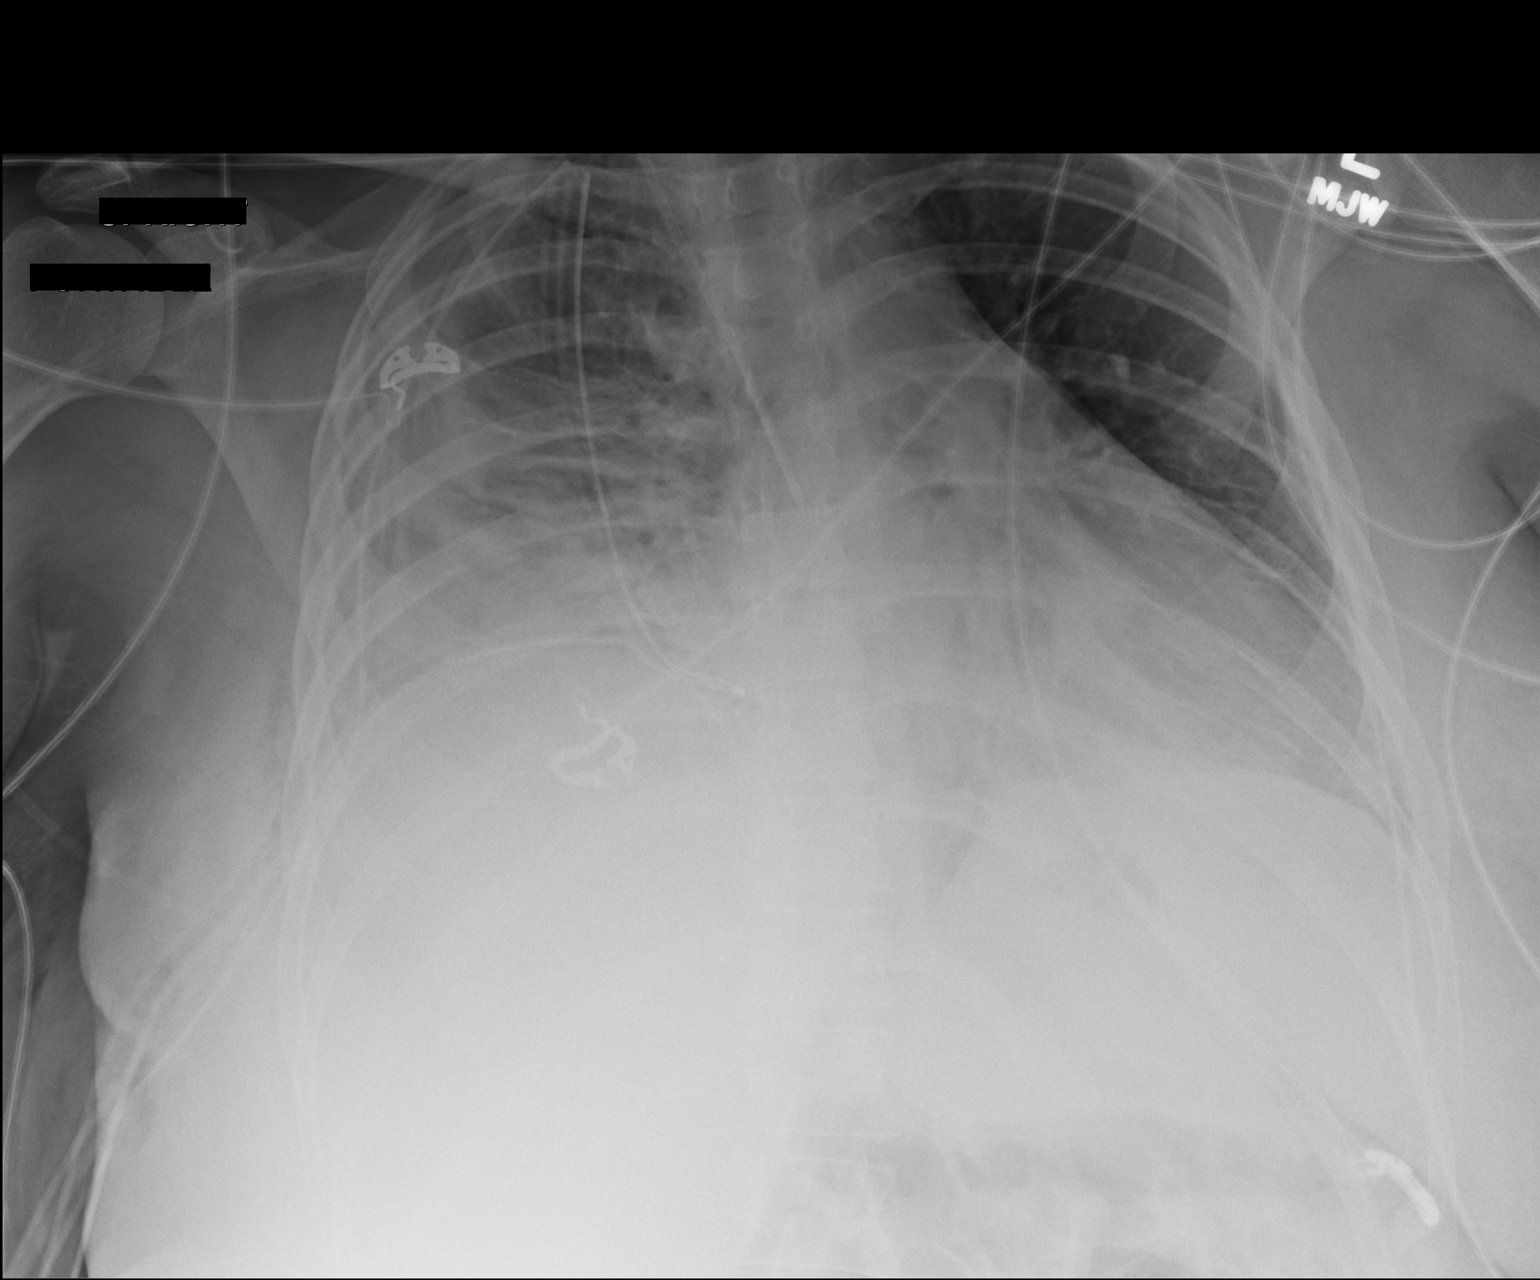

[1 of 1 positions shown; findings below may reference images not displayed]

FINDINGS: Right central line tip mid superior vena cava level.

Two right-sided chest tubes are in place. Tiny right apical
pneumothorax suspected.

Asymmetric lung disease greater on the right. Etiology
indeterminate. Infectious infiltrate not excluded in the proper
clinical setting. Pulmonary edema may partially contribute to these
findings. Please see recent chest CT report.
IMPRESSION: Two right-sided chest tubes are in place. Tiny right apical
pneumothorax suspected.

Asymmetric lung disease greater on the right. Etiology
indeterminate. Infectious infiltrate not excluded in the proper
clinical setting. Pulmonary edema may partially contribute to these
findings. Please see recent chest CT report.

## 2014-12-14 ENCOUNTER — Encounter (HOSPITAL_BASED_OUTPATIENT_CLINIC_OR_DEPARTMENT_OTHER): Payer: Self-pay | Admitting: Emergency Medicine

## 2014-12-14 ENCOUNTER — Emergency Department (HOSPITAL_BASED_OUTPATIENT_CLINIC_OR_DEPARTMENT_OTHER)
Admission: EM | Admit: 2014-12-14 | Discharge: 2014-12-14 | Disposition: A | Discharge status: Home / Self Care | Payer: Medicaid Other | Attending: Emergency Medicine | Admitting: Emergency Medicine

## 2014-12-14 DIAGNOSIS — Z87448 Personal history of other diseases of urinary system: Secondary | ICD-10-CM | POA: Insufficient documentation

## 2014-12-14 DIAGNOSIS — F419 Anxiety disorder, unspecified: Secondary | ICD-10-CM | POA: Diagnosis not present

## 2014-12-14 DIAGNOSIS — G8929 Other chronic pain: Secondary | ICD-10-CM | POA: Insufficient documentation

## 2014-12-14 DIAGNOSIS — K529 Noninfective gastroenteritis and colitis, unspecified: Secondary | ICD-10-CM | POA: Diagnosis not present

## 2014-12-14 DIAGNOSIS — Z862 Personal history of diseases of the blood and blood-forming organs and certain disorders involving the immune mechanism: Secondary | ICD-10-CM | POA: Insufficient documentation

## 2014-12-14 DIAGNOSIS — Z79899 Other long term (current) drug therapy: Secondary | ICD-10-CM | POA: Diagnosis not present

## 2014-12-14 DIAGNOSIS — Z87440 Personal history of urinary (tract) infections: Secondary | ICD-10-CM | POA: Insufficient documentation

## 2014-12-14 DIAGNOSIS — M1990 Unspecified osteoarthritis, unspecified site: Secondary | ICD-10-CM | POA: Diagnosis not present

## 2014-12-14 DIAGNOSIS — Z720 Tobacco use: Secondary | ICD-10-CM | POA: Diagnosis not present

## 2014-12-14 DIAGNOSIS — R1084 Generalized abdominal pain: Secondary | ICD-10-CM | POA: Diagnosis present

## 2014-12-14 DIAGNOSIS — Z8781 Personal history of (healed) traumatic fracture: Secondary | ICD-10-CM | POA: Diagnosis not present

## 2014-12-14 LAB — CBC WITH DIFFERENTIAL/PLATELET
BASOS ABS: 0 10*3/uL (ref 0.0–0.1)
BASOS PCT: 0 % (ref 0–1)
EOS ABS: 0.2 10*3/uL (ref 0.0–0.7)
Eosinophils Relative: 3 % (ref 0–5)
HEMATOCRIT: 39 % (ref 36.0–46.0)
HEMOGLOBIN: 12.4 g/dL (ref 12.0–15.0)
Lymphocytes Relative: 21 % (ref 12–46)
Lymphs Abs: 1.7 10*3/uL (ref 0.7–4.0)
MCH: 25 pg — ABNORMAL LOW (ref 26.0–34.0)
MCHC: 31.8 g/dL (ref 30.0–36.0)
MCV: 78.6 fL (ref 78.0–100.0)
MONOS PCT: 5 % (ref 3–12)
Monocytes Absolute: 0.4 10*3/uL (ref 0.1–1.0)
NEUTROS ABS: 5.6 10*3/uL (ref 1.7–7.7)
NEUTROS PCT: 71 % (ref 43–77)
Platelets: 233 10*3/uL (ref 150–400)
RBC: 4.96 MIL/uL (ref 3.87–5.11)
RDW: 14.2 % (ref 11.5–15.5)
WBC: 7.9 10*3/uL (ref 4.0–10.5)

## 2014-12-14 LAB — COMPREHENSIVE METABOLIC PANEL
ALBUMIN: 3.9 g/dL (ref 3.5–5.0)
ALK PHOS: 127 U/L — AB (ref 38–126)
ALT: 42 U/L (ref 14–54)
ANION GAP: 9 (ref 5–15)
AST: 40 U/L (ref 15–41)
BILIRUBIN TOTAL: 0.3 mg/dL (ref 0.3–1.2)
BUN: 15 mg/dL (ref 6–20)
CALCIUM: 9.5 mg/dL (ref 8.9–10.3)
CO2: 25 mmol/L (ref 22–32)
Chloride: 106 mmol/L (ref 101–111)
Creatinine, Ser: 0.73 mg/dL (ref 0.44–1.00)
GFR calc non Af Amer: >60 mL/min (ref >60)
GLUCOSE: 121 mg/dL — AB (ref 65–99)
POTASSIUM: 4 mmol/L (ref 3.5–5.1)
SODIUM: 140 mmol/L (ref 135–145)
TOTAL PROTEIN: 6.6 g/dL (ref 6.5–8.1)

## 2014-12-14 LAB — URINALYSIS, ROUTINE W REFLEX MICROSCOPIC
Bilirubin Urine: NEGATIVE
GLUCOSE, UA: NEGATIVE mg/dL
HGB URINE DIPSTICK: NEGATIVE
Ketones, ur: NEGATIVE mg/dL
Nitrite: NEGATIVE
Protein, ur: NEGATIVE mg/dL
SPECIFIC GRAVITY, URINE: 1.025 (ref 1.005–1.030)
Urobilinogen, UA: 1 mg/dL (ref 0.0–1.0)
pH: 6.5 (ref 5.0–8.0)

## 2014-12-14 LAB — URINE MICROSCOPIC-ADD ON

## 2014-12-14 LAB — LIPASE, BLOOD: Lipase: 24 U/L (ref 22–51)

## 2014-12-14 MED ORDER — ONDANSETRON HCL 4 MG/2ML IJ SOLN
4.0000 mg | Freq: Once | INTRAMUSCULAR | Status: AC
  Administered 2014-12-14: 4 mg via INTRAVENOUS
  Filled 2014-12-14: qty 2

## 2014-12-14 MED ORDER — ONDANSETRON 8 MG PO TBDP: 8.0000 mg | ORAL_TABLET | Freq: Three times a day (TID) | ORAL | Status: DC | PRN

## 2014-12-14 MED ORDER — SODIUM CHLORIDE 0.9 % IV BOLUS (SEPSIS)
1000.0000 mL | Freq: Once | INTRAVENOUS | Status: AC
  Administered 2014-12-14: 1000 mL via INTRAVENOUS

## 2014-12-14 MED ORDER — FENTANYL CITRATE (PF) 100 MCG/2ML IJ SOLN
100.0000 ug | Freq: Once | INTRAMUSCULAR | Status: AC
  Administered 2014-12-14: 100 ug via INTRAVENOUS
  Filled 2014-12-14: qty 2

## 2014-12-14 NOTE — ED Provider Notes (Signed)
CSN: [id number]     Arrival date & time 12/14/14  0004 History  This chart was scribed for [name] Libra, MD by Lyndel Safe, ED Scribe. This patient was seen in room MH06/MH06 and the patient's care was started 12:38 AM.   Chief Complaint  Patient presents with  . Abdominal Pain   The history is provided by the patient. No language interpreter was used.   HPI Comments: [name] [name] is a 24 y.o. female who presents to the Emergency Department complaining of generalized abdominal cramping onset yesterday morning. She reports associated nausea, 2 episodes of emesis and watery diarrhea. She additionally complains of SOB, stating she feels as though she cannot catch her breath. Denies blood in stool, dysuria, sore throat, rhinorrhea or pain with inspiration.   Past Medical History  Diagnosis Date  . Chronic pain     muscle spasms- entire body, pinch nerve in upper back  . Infection     UTI -Resolved - History only  . Fracture, ankle     left- x2 - resolved  . Post traumatic stress disorder (PTSD)   . OCD (obsessive compulsive disorder)   . SVD (spontaneous vaginal delivery) 12/2012    x 1 at [facility]  . PONV (postoperative nausea and vomiting)   . Heartburn     zantac prn  . Hydronephrosis     History  . Headache(784.0)     otc med prn  . Anemia   . History of blood transfusion 12/2012    at [facility] - 1 unit transfused  . Arthritis     knee, ankle, left wrist  . Depression     doing fine now  . Anxiety     from OCD   Past Surgical History  Procedure Laterality Date  . Nephrostomy tube placement at 25wks and removed at 335/7 Right   . Cystoscopy with stent placement Right 12/25/2012    Procedure: CYSTOSCOPY WITH STENT PLACEMENT;  Surgeon: Marcine Matar, MD;  Location: [facility] ORS;  Service: Urology;  Laterality: Right;  . Video assisted thoracoscopy (vats)/decortication Right 01/08/2013    Procedure: VIDEO ASSISTED THORACOSCOPY DECORTICATION; Drainage of Pleural Effusion;   Surgeon: Loreli Slot, MD;  Location: [facility] OR;  Service: Thoracic;  Laterality: Right;  . Laparoscopic bilateral salpingectomy Bilateral 03/31/2013    Procedure: LAPAROSCOPIC BILATERAL SALPINGECTOMY;  Surgeon: Lavina Hamman, MD;  Location: [facility] ORS;  Service: Gynecology;  Laterality: Bilateral;  . Tubal ligation    . Wisdom tooth extraction  06/2013  . Laparoscopy N/A 01/23/2014    Procedure: LAPAROSCOPY DIAGNOSTIC RIGHT PERITONEAL BIOPSY;  Surgeon: Lavina Hamman, MD;  Location: [facility] ORS;  Service: Gynecology;  Laterality: N/A;  . Laparoscopic assisted vaginal hysterectomy N/A 06/17/2014    Procedure: LAPAROSCOPIC ASSISTED VAGINAL HYSTERECTOMY;  Surgeon: Lavina Hamman, MD;  Location: [facility];  Service: Gynecology;  Laterality: N/A;  . Oophorectomy Bilateral 06/17/2014    Procedure: OOPHORECTOMY;  Surgeon: Lavina Hamman, MD;  Location: [facility] ORS;  Service: Gynecology;  Laterality: Bilateral;   Family History  Problem Relation Age of Onset  . Heart disease Mother   . Hypertension Father   . Cancer Paternal Grandmother     lung  . Heart disease Paternal Grandmother    Social History  Substance Use Topics  . Smoking status: Current Some Day Smoker -- 0.25 packs/day for 9 years    Types: Cigarettes  . Smokeless tobacco: Never Used     Comment: has cut down  . Alcohol Use: Yes  Comment: occasionally wine   OB History    Gravida Para Term Preterm AB TAB SAB Ectopic Multiple Living   2 1 0 0 0 0 0 0 0 0      Review of Systems A complete 10 system review of systems was obtained and is otherwise negative except at noted in the HPI and PMH.  Allergies  Cinnamon; Shellfish allergy; Augmentin; and Nickel  Home Medications   Prior to Admission medications   Medication Sig Start Date End Date Taking? Authorizing Provider  HYDROmorphone (DILAUDID) 2 MG tablet Take 1 tablet (2 mg total) by mouth every 3 (three) hours as needed for severe pain. 06/17/14   Lavina Hamman, MD  ibuprofen  (ADVIL,MOTRIN) 600 MG tablet Take 1 tablet (600 mg total) by mouth every 6 (six) hours as needed (mild pain). 06/17/14   Lavina Hamman, MD  LORazepam (ATIVAN) 0.5 MG tablet Take 0.5 mg by mouth 2 (two) times daily.    Historical Provider, MD  ranitidine (ZANTAC) 150 MG tablet Take 150 mg by mouth daily as needed for heartburn.    Historical Provider, MD   BP 134/96 mmHg  Pulse 81  Temp(Src) 98.7 F (37.1 C) (Oral)  Resp 16  Ht 5\' 2"  (1.575 m)  Wt 168 lb (76.204 kg)  BMI 30.72 kg/m2  SpO2 100%  Physical Exam General: Well-developed, well-nourished female in no acute distress; appearance consistent with age of record HENT: normocephalic; atraumatic Eyes: pupils equal, round and reactive to light; extraocular muscles intact Neck: supple Heart: regular rate and rhythm Lungs: clear to auscultation bilaterally Abdomen: soft; nondistended; nontender; no masses or hepatosplenomegaly; bowel sounds present Extremities: No deformity; full range of motion; pulses normal Neurologic: Awake, alert and oriented; motor function intact in all extremities and symmetric; no facial droop Skin: Warm and dry Psychiatric: Flat affect  ED Course  Procedures  DIAGNOSTIC STUDIES: Oxygen Saturation is 100% on RA, normal by my interpretation.    COORDINATION OF CARE: 12:42 AM Discussed treatment plan with pt. Will order diagnostic labs. Pt acknowledges and agrees to plan.    MDM   Nursing notes and vitals signs, including pulse oximetry, reviewed.  Summary of this visit's results, reviewed by myself:  Labs:  Results for orders placed or performed during the hospital encounter of 12/14/14 (from the past 24 hour(s))  Urinalysis, Routine w reflex microscopic (not at [facility])     Status: Abnormal   Collection Time: 12/14/14 12:31 AM  Result Value Ref Range   Color, Urine YELLOW YELLOW   APPearance CLOUDY (A) CLEAR   Specific Gravity, Urine 1.025 1.005 - 1.030   pH 6.5 5.0 - 8.0   Glucose, UA NEGATIVE  NEGATIVE mg/dL   Hgb urine dipstick NEGATIVE NEGATIVE   Bilirubin Urine NEGATIVE NEGATIVE   Ketones, ur NEGATIVE NEGATIVE mg/dL   Protein, ur NEGATIVE NEGATIVE mg/dL   Urobilinogen, UA 1.0 0.0 - 1.0 mg/dL   Nitrite NEGATIVE NEGATIVE   Leukocytes, UA SMALL (A) NEGATIVE  Urine microscopic-add on     Status: Abnormal   Collection Time: 12/14/14 12:31 AM  Result Value Ref Range   Squamous Epithelial / LPF RARE RARE   WBC, UA 3-6 <3 WBC/hpf   RBC / HPF 0-2 <3 RBC/hpf   Bacteria, UA FEW (A) RARE   Urine-Other MUCOUS PRESENT   Comprehensive metabolic panel     Status: Abnormal   Collection Time: 12/14/14 12:58 AM  Result Value Ref Range   Sodium 140 135 - 145 mmol/L   Potassium  4.0 3.5 - 5.1 mmol/L   Chloride 106 101 - 111 mmol/L   CO2 25 22 - 32 mmol/L   Glucose, Bld 121 (H) 65 - 99 mg/dL   BUN 15 6 - 20 mg/dL   Creatinine, Ser 6.04 0.44 - 1.00 mg/dL   Calcium 9.5 8.9 - 54.0 mg/dL   Total Protein 6.6 6.5 - 8.1 g/dL   Albumin 3.9 3.5 - 5.0 g/dL   AST 40 15 - 41 U/L   ALT 42 14 - 54 U/L   Alkaline Phosphatase 127 (H) 38 - 126 U/L   Total Bilirubin 0.3 0.3 - 1.2 mg/dL   GFR calc non Af Amer >60 >60 mL/min   GFR calc Af Amer >60 >60 mL/min   Anion gap 9 5 - 15  Lipase, blood     Status: None   Collection Time: 12/14/14 12:58 AM  Result Value Ref Range   Lipase 24 22 - 51 U/L  CBC with Differential/Platelet     Status: Abnormal   Collection Time: 12/14/14 12:58 AM  Result Value Ref Range   WBC 7.9 4.0 - 10.5 K/uL   RBC 4.96 3.87 - 5.11 MIL/uL   Hemoglobin 12.4 12.0 - 15.0 g/dL   HCT 98.1 19.1 - 47.8 %   MCV 78.6 78.0 - 100.0 fL   MCH 25.0 (L) 26.0 - 34.0 pg   MCHC 31.8 30.0 - 36.0 g/dL   RDW 29.5 62.1 - 30.8 %   Platelets 233 150 - 400 K/uL   Neutrophils Relative % 71 43 - 77 %   Neutro Abs 5.6 1.7 - 7.7 K/uL   Lymphocytes Relative 21 12 - 46 %   Lymphs Abs 1.7 0.7 - 4.0 K/uL   Monocytes Relative 5 3 - 12 %   Monocytes Absolute 0.4 0.1 - 1.0 K/uL   Eosinophils Relative  3 0 - 5 %   Eosinophils Absolute 0.2 0.0 - 0.7 K/uL   Basophils Relative 0 0 - 1 %   Basophils Absolute 0.0 0.0 - 0.1 K/uL   3:19 AM Patient feeling better after IV fluids and Zofran. Drinking fluids without emesis.  I personally performed the services described in this documentation, which was scribed in my presence. The recorded information has been reviewed and is accurate.    [name] Libra, MD 12/14/14 [id number]

## 2014-12-14 NOTE — ED Notes (Signed)
Patient reports that she is having mid epigastric to chest pain. The patient patient reports that "everything hurts" reports that she has chest pain and shortness of breath with N/V

## 2015-01-26 DIAGNOSIS — E8941 Symptomatic postprocedural ovarian failure: Secondary | ICD-10-CM | POA: Insufficient documentation

## 2015-03-06 ENCOUNTER — Emergency Department (HOSPITAL_BASED_OUTPATIENT_CLINIC_OR_DEPARTMENT_OTHER)
Admission: EM | Admit: 2015-03-06 | Discharge: 2015-03-06 | Disposition: A | Discharge status: Home / Self Care | Payer: Medicaid Other | Attending: Emergency Medicine | Admitting: Emergency Medicine

## 2015-03-06 ENCOUNTER — Emergency Department (HOSPITAL_BASED_OUTPATIENT_CLINIC_OR_DEPARTMENT_OTHER): Payer: Medicaid Other

## 2015-03-06 ENCOUNTER — Encounter (HOSPITAL_BASED_OUTPATIENT_CLINIC_OR_DEPARTMENT_OTHER): Payer: Self-pay | Admitting: Emergency Medicine

## 2015-03-06 DIAGNOSIS — Z862 Personal history of diseases of the blood and blood-forming organs and certain disorders involving the immune mechanism: Secondary | ICD-10-CM | POA: Insufficient documentation

## 2015-03-06 DIAGNOSIS — M1990 Unspecified osteoarthritis, unspecified site: Secondary | ICD-10-CM | POA: Insufficient documentation

## 2015-03-06 DIAGNOSIS — G8929 Other chronic pain: Secondary | ICD-10-CM | POA: Insufficient documentation

## 2015-03-06 DIAGNOSIS — Z8781 Personal history of (healed) traumatic fracture: Secondary | ICD-10-CM | POA: Insufficient documentation

## 2015-03-06 DIAGNOSIS — Z87448 Personal history of other diseases of urinary system: Secondary | ICD-10-CM | POA: Insufficient documentation

## 2015-03-06 DIAGNOSIS — J209 Acute bronchitis, unspecified: Secondary | ICD-10-CM | POA: Insufficient documentation

## 2015-03-06 DIAGNOSIS — Z87440 Personal history of urinary (tract) infections: Secondary | ICD-10-CM | POA: Insufficient documentation

## 2015-03-06 DIAGNOSIS — R05 Cough: Secondary | ICD-10-CM | POA: Diagnosis present

## 2015-03-06 DIAGNOSIS — F419 Anxiety disorder, unspecified: Secondary | ICD-10-CM | POA: Insufficient documentation

## 2015-03-06 DIAGNOSIS — F17210 Nicotine dependence, cigarettes, uncomplicated: Secondary | ICD-10-CM | POA: Diagnosis not present

## 2015-03-06 DIAGNOSIS — Z79899 Other long term (current) drug therapy: Secondary | ICD-10-CM | POA: Insufficient documentation

## 2015-03-06 LAB — RAPID STREP SCREEN (MED CTR MEBANE ONLY): STREPTOCOCCUS, GROUP A SCREEN (DIRECT): NEGATIVE

## 2015-03-06 MED ORDER — GUAIFENESIN-CODEINE 100-10 MG/5ML PO SOLN: 10.0000 mL | Freq: Four times a day (QID) | ORAL | Status: DC | PRN

## 2015-03-06 MED ORDER — AZITHROMYCIN 250 MG PO TABS: ORAL_TABLET | ORAL | Status: DC

## 2015-03-06 NOTE — ED Provider Notes (Signed)
CSN: [id number]   Arrival date & time 03/06/15 2218  History  By signing my name below, I, [name] [name], attest that this documentation has been prepared under the direction and in the presence of Geoffery Lyons, MD. Electronically Signed: Bethel [name], ED Scribe. 03/06/2015. 11:26 PM.  Chief Complaint  Patient presents with  . Sore Throat  . Cough    HPI The history is provided by the patient. No language interpreter was used.   [name] [name] is a 24 y.o. female who presents to the Emergency Department complaining of sore throat with 1.5 weeks ago. Pt states that she has had strep throat before and this feels different. The pain is constant and rated 3/10 in severity. Pt states that her throat is dry and swollen. Associated symptoms include hoarse voice, congestion, and cough that is productive upon waking and dry throughout the day. Pt denies fever. She stopped smoking 2 months ago.   Past Medical History  Diagnosis Date  . Chronic pain     muscle spasms- entire body, pinch nerve in upper back  . Infection     UTI -Resolved - History only  . Fracture, ankle     left- x2 - resolved  . Post traumatic stress disorder (PTSD)   . OCD (obsessive compulsive disorder)   . SVD (spontaneous vaginal delivery) 12/2012    x 1 at [facility]  . PONV (postoperative nausea and vomiting)   . Heartburn     zantac prn  . Hydronephrosis     History  . Headache(784.0)     otc med prn  . Anemia   . History of blood transfusion 12/2012    at [facility] - 1 unit transfused  . Arthritis     knee, ankle, left wrist  . Depression     doing fine now  . Anxiety     from OCD    Past Surgical History  Procedure Laterality Date  . Nephrostomy tube placement at 25wks and removed at 335/7 Right   . Cystoscopy with stent placement Right 12/25/2012    Procedure: CYSTOSCOPY WITH STENT PLACEMENT;  Surgeon: Marcine Matar, MD;  Location: [facility];  Service: Urology;  Laterality: Right;  . Video assisted  thoracoscopy (vats)/decortication Right 01/08/2013    Procedure: VIDEO ASSISTED THORACOSCOPY DECORTICATION; Drainage of Pleural Effusion;  Surgeon: Loreli Slot, MD;  Location: [facility] OR;  Service: Thoracic;  Laterality: Right;  . Laparoscopic bilateral salpingectomy Bilateral 03/31/2013    Procedure: LAPAROSCOPIC BILATERAL SALPINGECTOMY;  Surgeon: Lavina Hamman, MD;  Location: [facility];  Service: Gynecology;  Laterality: Bilateral;  . Tubal ligation    . Wisdom tooth extraction  06/2013  . Laparoscopy N/A 01/23/2014    Procedure: LAPAROSCOPY DIAGNOSTIC RIGHT PERITONEAL BIOPSY;  Surgeon: Lavina Hamman, MD;  Location: [facility];  Service: Gynecology;  Laterality: N/A;  . Laparoscopic assisted vaginal hysterectomy N/A 06/17/2014    Procedure: LAPAROSCOPIC ASSISTED VAGINAL HYSTERECTOMY;  Surgeon: Lavina Hamman, MD;  Location: [facility];  Service: Gynecology;  Laterality: N/A;  . Oophorectomy Bilateral 06/17/2014    Procedure: OOPHORECTOMY;  Surgeon: Lavina Hamman, MD;  Location: [facility];  Service: Gynecology;  Laterality: Bilateral;    Family History  Problem Relation Age of Onset  . Heart disease Mother   . Hypertension Father   . Cancer Paternal Grandmother     lung  . Heart disease Paternal Grandmother     Social History  Substance Use Topics  . Smoking status: Current Some Day Smoker -- 0.25  packs/day for 9 years    Types: Cigarettes  . Smokeless tobacco: Never Used     Comment: has cut down  . Alcohol Use: Yes     Comment: occasionally wine     Review of Systems  10 Systems reviewed and all are negative for acute change except as noted in the HPI. Home Medications   Prior to Admission medications   Medication Sig Start Date End Date Taking? Authorizing Provider  ibuprofen (ADVIL,MOTRIN) 600 MG tablet Take 1 tablet (600 mg total) by mouth every 6 (six) hours as needed (mild pain). 06/17/14   Lavina Hammanodd Meisinger, MD  LORazepam (ATIVAN) 0.5 MG tablet Take 0.5 mg by mouth 2 (two) times daily.     Historical Provider, MD  ondansetron (ZOFRAN ODT) 8 MG disintegrating tablet Take 1 tablet (8 mg total) by mouth every 8 (eight) hours as needed for nausea or vomiting. 12/14/14   Paula LibraJohn Molpus, MD  ranitidine (ZANTAC) 150 MG tablet Take 150 mg by mouth daily as needed for heartburn.    Historical Provider, MD    Allergies  Cinnamon; Shellfish allergy; Augmentin; and Nickel  Triage Vitals: BP 153/100 mmHg  Pulse 100  Temp(Src) 98.3 F (36.8 C) (Oral)  Resp 20  Ht 5\' 3"  (1.6 m)  Wt 176 lb (79.833 kg)  BMI 31.18 kg/m2  SpO2 99%  LMP 01/04/2014  Physical Exam  Constitutional: She is oriented to person, place, and time. She appears well-developed and well-nourished. No distress.  HENT:  Head: Normocephalic and atraumatic.  Right Ear: Tympanic membrane and external ear normal.  Left Ear: Tympanic membrane and external ear normal.  Nose: Nose normal.  Mouth/Throat: Uvula is midline, oropharynx is clear and moist and mucous membranes are normal. No oropharyngeal exudate, posterior oropharyngeal edema, posterior oropharyngeal erythema or tonsillar abscesses.  Pt speaks in a hoarse voice.  Eyes: EOM are normal.  Neck: Normal range of motion. Neck supple.  Cardiovascular: Normal rate, regular rhythm and normal heart sounds.   Pulmonary/Chest: Effort normal and breath sounds normal.  Abdominal: Soft. She exhibits no distension. There is no tenderness.  Musculoskeletal: Normal range of motion.  Neurological: She is alert and oriented to person, place, and time.  Skin: Skin is warm and dry.  Psychiatric: She has a normal mood and affect. Judgment normal.  Nursing note and vitals reviewed.   ED Course  Procedures   DIAGNOSTIC STUDIES: Oxygen Saturation is 99% on RA, normal by my interpretation.    COORDINATION OF CARE: 11:16 PM Discussed treatment plan which includes CXR and lab work with pt at bedside and pt agreed to plan.  Labs Reviewed  RAPID STREP SCREEN (NOT AT [facility])   CULTURE, GROUP A STREP    Imaging Review Dg Chest 2 View  03/06/2015  CLINICAL DATA:  Productive cough. EXAM: CHEST  2 VIEW COMPARISON:  01/28/2013 chest radiograph. FINDINGS: Stable cardiomediastinal silhouette with normal heart size. No pneumothorax. No pleural effusion. Stable mild chronic pleural-parenchymal scarring at the lateral right lung base (as seen on 05/08/2013 CT abdomen study). No pulmonary edema. No focal lung opacity. IMPRESSION: No active cardiopulmonary disease. Stable chronic pleural-parenchymal scarring at the lateral right lung base. Electronically Signed   By: Delbert PhenixJason A Poff M.D.   On: 03/06/2015 23:20    I personally reviewed and evaluated these images and lab results as a part of my medical decision-making.  MDM   Final diagnoses:  None    We'll treat with Zithromax and Robitussin with codeine for what appears  to be a persistent bronchitis. To return as needed if symptoms worsen or change.  I personally performed the services described in this documentation, which was scribed in my presence. The recorded information has been reviewed and is accurate.       Geoffery Lyons, MD 03/07/15 [phone]

## 2015-03-06 NOTE — Discharge Instructions (Signed)
Zithromax as prescribed.  Robitussin with codeine as prescribed as needed for cough.  Return to the emergency department if you develop difficulty breathing, severe chest pain, or other new and concerning symptoms.   Acute Bronchitis Bronchitis is inflammation of the airways that extend from the windpipe into the lungs (bronchi). The inflammation often causes mucus to develop. This leads to a cough, which is the most common symptom of bronchitis.  In acute bronchitis, the condition usually develops suddenly and goes away over time, usually in a couple weeks. Smoking, allergies, and asthma can make bronchitis worse. Repeated episodes of bronchitis may cause further lung problems.  CAUSES Acute bronchitis is most often caused by the same virus that causes a cold. The virus can spread from person to person (contagious) through coughing, sneezing, and touching contaminated objects. SIGNS AND SYMPTOMS   Cough.   Fever.   Coughing up mucus.   Body aches.   Chest congestion.   Chills.   Shortness of breath.   Sore throat.  DIAGNOSIS  Acute bronchitis is usually diagnosed through a physical exam. Your health care provider will also ask you questions about your medical history. Tests, such as chest X-rays, are sometimes done to rule out other conditions.  TREATMENT  Acute bronchitis usually goes away in a couple weeks. Oftentimes, no medical treatment is necessary. Medicines are sometimes given for relief of fever or cough. Antibiotic medicines are usually not needed but may be prescribed in certain situations. In some cases, an inhaler may be recommended to help reduce shortness of breath and control the cough. A cool mist vaporizer may also be used to help thin bronchial secretions and make it easier to clear the chest.  HOME CARE INSTRUCTIONS  Get plenty of rest.   Drink enough fluids to keep your urine clear or pale yellow (unless you have a medical condition that requires  fluid restriction). Increasing fluids may help thin your respiratory secretions (sputum) and reduce chest congestion, and it will prevent dehydration.   Take medicines only as directed by your health care provider.  If you were prescribed an antibiotic medicine, finish it all even if you start to feel better.  Avoid smoking and secondhand smoke. Exposure to cigarette smoke or irritating chemicals will make bronchitis worse. If you are a smoker, consider using nicotine gum or skin patches to help control withdrawal symptoms. Quitting smoking will help your lungs heal faster.   Reduce the chances of another bout of acute bronchitis by washing your hands frequently, avoiding people with cold symptoms, and trying not to touch your hands to your mouth, nose, or eyes.   Keep all follow-up visits as directed by your health care provider.  SEEK MEDICAL CARE IF: Your symptoms do not improve after 1 week of treatment.  SEEK IMMEDIATE MEDICAL CARE IF:  You develop an increased fever or chills.   You have chest pain.   You have severe shortness of breath.  You have bloody sputum.   You develop dehydration.  You faint or repeatedly feel like you are going to pass out.  You develop repeated vomiting.  You develop a severe headache. MAKE SURE YOU:   Understand these instructions.  Will watch your condition.  Will get help right away if you are not doing well or get worse.   This information is not intended to replace advice given to you by your health care provider. Make sure you discuss any questions you have with your health care provider.  Document Released: 05/11/2004 Document Revised: 04/24/2014 Document Reviewed: 09/24/2012 Elsevier Interactive Patient Education [employer].

## 2015-03-06 NOTE — ED Notes (Signed)
Patient states that she has lost her voice, and has a sore throat. Reports that she is coughing up blood and it is not a typical cold

## 2015-03-10 LAB — CULTURE, GROUP A STREP

## 2015-04-06 ENCOUNTER — Ambulatory Visit (INDEPENDENT_AMBULATORY_CARE_PROVIDER_SITE_OTHER): Payer: Medicaid Other | Admitting: Allergy and Immunology

## 2015-04-06 ENCOUNTER — Encounter: Payer: Self-pay | Admitting: Allergy and Immunology

## 2015-04-06 VITALS — BP 128/100 | HR 96 | Temp 98.4°F | Resp 18 | Ht 62.8 in | Wt 169.8 lb

## 2015-04-06 DIAGNOSIS — T7800XA Anaphylactic reaction due to unspecified food, initial encounter: Secondary | ICD-10-CM | POA: Insufficient documentation

## 2015-04-06 DIAGNOSIS — J310 Chronic rhinitis: Secondary | ICD-10-CM

## 2015-04-06 DIAGNOSIS — T7840XA Allergy, unspecified, initial encounter: Secondary | ICD-10-CM

## 2015-04-06 DIAGNOSIS — R030 Elevated blood-pressure reading, without diagnosis of hypertension: Secondary | ICD-10-CM

## 2015-04-06 DIAGNOSIS — T7800XD Anaphylactic reaction due to unspecified food, subsequent encounter: Secondary | ICD-10-CM | POA: Diagnosis not present

## 2015-04-06 MED ORDER — EPINEPHRINE 0.3 MG/0.3ML IJ SOAJ: INTRAMUSCULAR | Status: DC

## 2015-04-06 MED ORDER — FLUTICASONE PROPIONATE 50 MCG/ACT NA SUSP: NASAL | Status: DC

## 2015-04-06 NOTE — Assessment & Plan Note (Signed)
   [name] [name] has been asked to follow up with her primary care physician regarding this issue.

## 2015-04-06 NOTE — Patient Instructions (Addendum)
Allergic reactions Unclear etiology.  Select food allergen skin tests were negative today despite a positive histamine control.  The following labs have been ordered: Baseline serum tryptase, FCeRI antibody, TSH, anti-thyroglobulin antibody, C4, ESR, ANA, and galactose-alpha-1,3-galactose. An additional lab order for serum tryptase has been provided which is to be kept by the patient to be drawn in the emergency department within 4 hours of symptom onset should symptoms recur.  Should symptoms recur, the patient has been asked to keep a journal to record any foods eaten, beverages consumed, medications taken within a 6 hour period prior to the onset of symptoms, as well as record activities being performed, and environmental conditions. For any symptoms concerning for anaphylaxis, epinephrine is to be administered and 911 is to be called immediately.  Chronic rhinitis Non-allergic rhinitis.  All seasonal and perennial aeroallergen skin tests are negative despite a positive histamine control.  Intranasal steroids and intranasal antihistamines are effective for symptoms associated with non-allergic rhinitis, whereas second generation antihistamines such as cetirizine, loratadine and fexofenadine have been found to be ineffective for this condition.  A prescription has been provided for fluticasone nasal spray, one spray per nostril 1-2 times daily as needed. Proper nasal spray technique has been discussed and demonstrated.  Nasal saline lavage (NeilMed) as needed has been recommended along with instructions for proper administration.  Allergy with anaphylaxis due to food  Continue meticulous avoidance of shellfish and cinnamon and have access to epinephrine autoinjector 2 pack.  A prescription has been provided for EpiPen 0.3 mg 2 pack along with instructions for its proper administration.  Elevated blood pressure reading without diagnosis of hypertension  [name] [name] has been asked to follow up with  her primary care physician regarding this issue.    When lab results have returned the patient will be called with further recommendations and follow up.

## 2015-04-06 NOTE — Progress Notes (Signed)
NEW PATIENT NOTE  RE: [name] [name] MRN: [id number] DOB: 10-21-1990 [facility] [place] [address] [place] [address] Date of Office Visit: 04/06/2015  Referring provider: Bernerd Limbo, MD [address] [place], [place] [address]  Chief Complaint: Allergic reaction and rhinitis   History of present illness: HPI Comments: [name] [name] is a 24 y.o. female who presents today for her initial consultation of allergic reactions and rhinitis.  She reports that she has had anaphylactic reactions in the past secondary to shellfish and cinnamon with symptoms including generalized urticaria, dyspnea, chest tightness, and cyanosis.  She required emergency department observation treatment on both occasions.  She strictly avoids shellfish and cinnamon but does not have an epinephrine autoinjector.  Approximately one month ago she developed hives on her right hand which progressed to her right arm, as well as facial edema and dyspnea.  She went to the local urgent care where she was treated with systemic steroids and antihistamines.  Symptom onset occurred while she was at work in [employer], however she is unable to identify any specific trigger.  She reports that she develops hives every few months without an identified medication, food, or environmental triggers.  She reports that she has sensitive skin and uses dye free and sent free detergents and soaps. [name] [name] experiences nasal congestion, rhinorrhea, sneezing, and postnasal drainage. No significant seasonal symptom variation has been noted nor have specific environmental triggers been identified.   Assessment and plan: Allergic reactions Unclear etiology.  Select food allergen skin tests were negative today despite a positive histamine control.  The following labs have been ordered: Baseline serum tryptase, FCeRI antibody, TSH, anti-thyroglobulin  antibody, C4, ESR, ANA, and galactose-alpha-1,3-galactose. An additional lab order for serum tryptase has been provided which is to be kept by the patient to be drawn in the emergency department within 4 hours of symptom onset should symptoms recur.  Should symptoms recur, the patient has been asked to keep a journal to record any foods eaten, beverages consumed, medications taken within a 6 hour period prior to the onset of symptoms, as well as record activities being performed, and environmental conditions. For any symptoms concerning for anaphylaxis, epinephrine is to be administered and 911 is to be called immediately.  Chronic rhinitis Non-allergic rhinitis.  All seasonal and perennial aeroallergen skin tests are negative despite a positive histamine control.  Intranasal steroids and intranasal antihistamines are effective for symptoms associated with non-allergic rhinitis, whereas second generation antihistamines such as cetirizine, loratadine and fexofenadine have been found to be ineffective for this condition.  A prescription has been provided for fluticasone nasal spray, one spray per nostril 1-2 times daily as needed. Proper nasal spray technique has been discussed and demonstrated.  Nasal saline lavage (NeilMed) as needed has been recommended along with instructions for proper administration.  Allergy with anaphylaxis due to food  Continue meticulous avoidance of shellfish and cinnamon and have access to epinephrine autoinjector 2 pack.  A prescription has been provided for EpiPen 0.3 mg 2 pack along with instructions for its proper administration.  Elevated blood pressure reading without diagnosis of hypertension  [name] [name] has been asked to follow up with her primary care physician regarding this issue.     Medications ordered this encounter: Meds ordered this encounter  Medications  . EPINEPHrine 0.3 mg/0.3 mL IJ SOAJ injection    Sig: Use as directed for life-threatening  allergic reaction.    Dispense:  2 Device    Refill:  3  . fluticasone (FLONASE) 50 MCG/ACT nasal spray    Sig: Use one spray in each nostril one to two times daily as needed for runny nose and congestion.    Dispense:  16 g    Refill:  5    Diagnositics: Environmental skin testing: Negative despite a positive histamine control. Food allergen skin testing: Negative despite a positive histamine control.    Physical examination: Blood pressure 128/100, pulse 96, temperature 98.4 F (36.9 C), resp. rate 18, height 5' 2.8" (1.595 m), weight 169 lb 12.1 oz (77 kg), last menstrual period 01/04/2014.  General: Alert, interactive, in no acute distress. HEENT: TMs pearly gray, turbinates edematous without discharge, post-pharynx moderately erythematous. Neck: Supple without lymphadenopathy. Lungs: Clear to auscultation without wheezing, rhonchi or rales. CV: Normal S1, S2 without murmurs. Abdomen: Nondistended, nontender. Skin: Warm and dry, without lesions or rashes. Extremities:  No clubbing, cyanosis or edema. Neuro:   Grossly intact.  Review of systems: Review of Systems  Constitutional: Negative for fever, chills and weight loss.  HENT: Positive for congestion. Negative for nosebleeds.   Eyes: Negative for blurred vision.  Respiratory: Negative for hemoptysis and wheezing.   Cardiovascular: Negative for chest pain.  Gastrointestinal: Negative for diarrhea and constipation.  Genitourinary: Negative for dysuria.  Musculoskeletal: Negative for myalgias and joint pain.  Neurological: Negative for dizziness.  Endo/Heme/Allergies: Does not bruise/bleed easily.    Past medical history: Past Medical History  Diagnosis Date  . Chronic pain     muscle spasms- entire body, pinch nerve in upper back  . Infection     UTI -Resolved - History only  . Fracture, ankle     left- x2 - resolved  . Post traumatic stress disorder (PTSD)   . OCD (obsessive compulsive disorder)   . SVD  (spontaneous vaginal delivery) 12/2012    x 1 at [facility]  . PONV (postoperative nausea and vomiting)   . Heartburn     zantac prn  . Hydronephrosis     History  . Headache(784.0)     otc med prn  . Anemia   . History of blood transfusion 12/2012    at [facility] - 1 unit transfused  . Arthritis     knee, ankle, left wrist  . Depression     doing fine now  . Anxiety     from OCD    Past surgical history: Past Surgical History  Procedure Laterality Date  . Nephrostomy tube placement at 25wks and removed at 335/7 Right   . Cystoscopy with stent placement Right 12/25/2012    Procedure: CYSTOSCOPY WITH STENT PLACEMENT;  Surgeon: Franchot Gallo, MD;  Location: [facility];  Service: Urology;  Laterality: Right;  . Video assisted thoracoscopy (vats)/decortication Right 01/08/2013    Procedure: VIDEO ASSISTED THORACOSCOPY DECORTICATION; Drainage of Pleural Effusion;  Surgeon: Melrose Nakayama, MD;  Location: [facility];  Service: Thoracic;  Laterality: Right;  . Laparoscopic bilateral salpingectomy Bilateral 03/31/2013    Procedure: LAPAROSCOPIC BILATERAL SALPINGECTOMY;  Surgeon: Cheri Fowler, MD;  Location: [facility];  Service: Gynecology;  Laterality: Bilateral;  . Tubal ligation    . Wisdom tooth extraction  06/2013  . Laparoscopy N/A 01/23/2014    Procedure: LAPAROSCOPY DIAGNOSTIC RIGHT PERITONEAL BIOPSY;  Surgeon: Cheri Fowler, MD;  Location: [facility];  Service: Gynecology;  Laterality: N/A;  . Laparoscopic assisted vaginal hysterectomy N/A 06/17/2014    Procedure: LAPAROSCOPIC ASSISTED VAGINAL  HYSTERECTOMY;  Surgeon: Cheri Fowler, MD;  Location: [facility];  Service: Gynecology;  Laterality: N/A;  . Oophorectomy Bilateral 06/17/2014    Procedure: OOPHORECTOMY;  Surgeon: Cheri Fowler, MD;  Location: [facility];  Service: Gynecology;  Laterality: Bilateral;    Family history: Family History  Problem Relation Age of Onset  . Heart disease Mother   . Hypertension Father   . Cancer Paternal  Grandmother     lung  . Heart disease Paternal Grandmother     Social history: Social History   Social History  . Marital Status: Single    Spouse Name: N/A  . Number of Children: N/A  . Years of Education: N/A   Occupational History  . Not on file.   Social History Main Topics  . Smoking status: Current Every Day Smoker -- 0.25 packs/day for 9 years    Types: Cigarettes  . Smokeless tobacco: Never Used     Comment: has cut down  . Alcohol Use: Yes     Comment: occasionally wine  . Drug Use: No  . Sexual Activity: Yes    Birth Control/ Protection: Surgical   Other Topics Concern  . Not on file   Social History Narrative   Environmental History:  [name] [name] has a 25 year old house with laminate floors throughout and central air/heat.  There are 2 dogs in the house which do not have access to her bedroom.  She smokes cigarettes.  Known medication allergies: Allergies  Allergen Reactions  . Cinnamon Anaphylaxis and Hives  . Shellfish Allergy Anaphylaxis  . Augmentin [Amoxicillin-Pot Clavulanate] Nausea And Vomiting and Rash  . Nickel Rash    Outpatient medications:   Medication List       This list is accurate as of: 04/06/15  6:11 PM.  Always use your most recent med list.               ADDERALL 20 MG tablet  Generic drug:  amphetamine-dextroamphetamine  Take 20 mg by mouth daily.     EPINEPHrine 0.3 mg/0.3 mL Soaj injection  Commonly known as:  EPI-PEN  Use as directed for life-threatening allergic reaction.     fluticasone 50 MCG/ACT nasal spray  Commonly known as:  FLONASE  Use one spray in each nostril one to two times daily as needed for runny nose and congestion.     guaiFENesin-codeine 100-10 MG/5ML syrup  Take 10 mLs by mouth every 6 (six) hours as needed for cough.     ibuprofen 600 MG tablet  Commonly known as:  ADVIL,MOTRIN  Take 1 tablet (600 mg total) by mouth every 6 (six) hours as needed (mild pain).     LORazepam 0.5 MG tablet   Commonly known as:  ATIVAN  Take 0.5 mg by mouth as needed.     PREMARIN 0.3 MG tablet  Generic drug:  estrogens (conjugated)  Take 0.3 mg by mouth as needed.     ranitidine 150 MG tablet  Commonly known as:  ZANTAC  Take 150 mg by mouth daily.        I appreciate the opportunity to take part in this Cheralyn's care. Please do not hesitate to contact me with questions.  Sincerely,   R. Edgar Frisk, MD

## 2015-04-06 NOTE — Assessment & Plan Note (Signed)
   Continue meticulous avoidance of shellfish and cinnamon and have access to epinephrine autoinjector 2 pack.  A prescription has been provided for EpiPen 0.3 mg 2 pack along with instructions for its proper administration.

## 2015-04-06 NOTE — Assessment & Plan Note (Signed)
Non-allergic rhinitis.  All seasonal and perennial aeroallergen skin tests are negative despite a positive histamine control.  Intranasal steroids and intranasal antihistamines are effective for symptoms associated with non-allergic rhinitis, whereas second generation antihistamines such as cetirizine, loratadine and fexofenadine have been found to be ineffective for this condition.  A prescription has been provided for fluticasone nasal spray, one spray per nostril 1-2 times daily as needed. Proper nasal spray technique has been discussed and demonstrated.  Nasal saline lavage (NeilMed) as needed has been recommended along with instructions for proper administration. 

## 2015-04-06 NOTE — Assessment & Plan Note (Addendum)
Unclear etiology.  Select food allergen skin tests were negative today despite a positive histamine control.  The following labs have been ordered: Baseline serum tryptase, FCeRI antibody, TSH, anti-thyroglobulin antibody, C4, ESR, ANA, and galactose-alpha-1,3-galactose. An additional lab order for serum tryptase has been provided which is to be kept by the patient to be drawn in the emergency department within 4 hours of symptom onset should symptoms recur.  Should symptoms recur, the patient has been asked to keep a journal to record any foods eaten, beverages consumed, medications taken within a 6 hour period prior to the onset of symptoms, as well as record activities being performed, and environmental conditions. For any symptoms concerning for anaphylaxis, epinephrine is to be administered and 911 is to be called immediately.

## 2015-04-07 ENCOUNTER — Telehealth: Payer: Self-pay

## 2015-04-07 ENCOUNTER — Other Ambulatory Visit: Payer: Self-pay | Admitting: Allergy and Immunology

## 2015-04-07 NOTE — Telephone Encounter (Signed)
Noted. Thanks.

## 2015-04-07 NOTE — Telephone Encounter (Signed)
Ray Dewayne HatchAnn from [facility] called this morning saying that the ANA Reflex to ENA 9Panel that was ordered for patient is no longer available and asked if Dr.Bobbit wanted to order another ANA. I talked to Dr.Bobbit and we changed it to ANA with Reflex. I called [facility] back and left detailed message saying that we wanted ANA with Reflex, Test Code [id number] in place of the previous test.  Left my name and the office number if they have any questions.  NOTE: Valma CavaRay Ann said that Cordelia PenSherry did leave [facility] yesterday angry and upset...she had waited for awhile before she was taken back and when the technician told her that they needed to call our office to get clarification on the ANA test, she said she would make appointment next time and left before blood was drawn.

## 2015-04-08 ENCOUNTER — Telehealth: Payer: Self-pay | Admitting: *Deleted

## 2015-04-08 NOTE — Telephone Encounter (Signed)
Informed patient to follow up with her primary about her blood pressure. Patient also asked about the blood work and told her that was corrected and she could go get her blood test at her earliest convenience.

## 2015-04-08 NOTE — Telephone Encounter (Signed)
-----   Message from Cristal Fordalph Carter Bobbitt, MD sent at 04/06/2015  7:22 PM EST ----- Please call and inform [name] [name] that her blood pressure reading was elevated today (128/100).  Please have her follow up with her primary care physician regarding this issue. Thanks.

## 2015-07-11 ENCOUNTER — Emergency Department (HOSPITAL_BASED_OUTPATIENT_CLINIC_OR_DEPARTMENT_OTHER)
Admission: EM | Admit: 2015-07-11 | Discharge: 2015-07-12 | Disposition: A | Discharge status: Home / Self Care | Payer: Medicaid Other | Attending: Emergency Medicine | Admitting: Emergency Medicine

## 2015-07-11 ENCOUNTER — Encounter (HOSPITAL_BASED_OUTPATIENT_CLINIC_OR_DEPARTMENT_OTHER): Payer: Self-pay | Admitting: Emergency Medicine

## 2015-07-11 ENCOUNTER — Emergency Department (HOSPITAL_BASED_OUTPATIENT_CLINIC_OR_DEPARTMENT_OTHER): Payer: Medicaid Other

## 2015-07-11 DIAGNOSIS — G8929 Other chronic pain: Secondary | ICD-10-CM | POA: Diagnosis not present

## 2015-07-11 DIAGNOSIS — R112 Nausea with vomiting, unspecified: Secondary | ICD-10-CM

## 2015-07-11 DIAGNOSIS — Z3202 Encounter for pregnancy test, result negative: Secondary | ICD-10-CM | POA: Insufficient documentation

## 2015-07-11 DIAGNOSIS — Z7951 Long term (current) use of inhaled steroids: Secondary | ICD-10-CM | POA: Insufficient documentation

## 2015-07-11 DIAGNOSIS — F17210 Nicotine dependence, cigarettes, uncomplicated: Secondary | ICD-10-CM | POA: Insufficient documentation

## 2015-07-11 DIAGNOSIS — R109 Unspecified abdominal pain: Secondary | ICD-10-CM | POA: Diagnosis present

## 2015-07-11 DIAGNOSIS — Z87448 Personal history of other diseases of urinary system: Secondary | ICD-10-CM | POA: Diagnosis not present

## 2015-07-11 DIAGNOSIS — F419 Anxiety disorder, unspecified: Secondary | ICD-10-CM | POA: Diagnosis not present

## 2015-07-11 DIAGNOSIS — K529 Noninfective gastroenteritis and colitis, unspecified: Secondary | ICD-10-CM

## 2015-07-11 DIAGNOSIS — Z79899 Other long term (current) drug therapy: Secondary | ICD-10-CM | POA: Insufficient documentation

## 2015-07-11 DIAGNOSIS — M1990 Unspecified osteoarthritis, unspecified site: Secondary | ICD-10-CM | POA: Diagnosis not present

## 2015-07-11 DIAGNOSIS — Z862 Personal history of diseases of the blood and blood-forming organs and certain disorders involving the immune mechanism: Secondary | ICD-10-CM | POA: Diagnosis not present

## 2015-07-11 DIAGNOSIS — Z9851 Tubal ligation status: Secondary | ICD-10-CM | POA: Diagnosis not present

## 2015-07-11 DIAGNOSIS — Z87440 Personal history of urinary (tract) infections: Secondary | ICD-10-CM | POA: Insufficient documentation

## 2015-07-11 DIAGNOSIS — Z90710 Acquired absence of both cervix and uterus: Secondary | ICD-10-CM | POA: Insufficient documentation

## 2015-07-11 LAB — URINALYSIS, ROUTINE W REFLEX MICROSCOPIC
Bilirubin Urine: NEGATIVE
GLUCOSE, UA: NEGATIVE mg/dL
HGB URINE DIPSTICK: NEGATIVE
KETONES UR: NEGATIVE mg/dL
Leukocytes, UA: NEGATIVE
Nitrite: NEGATIVE
PROTEIN: NEGATIVE mg/dL
Specific Gravity, Urine: 1.022 (ref 1.005–1.030)
pH: 7.5 (ref 5.0–8.0)

## 2015-07-11 MED ORDER — SODIUM CHLORIDE 0.9 % IV BOLUS (SEPSIS)
1000.0000 mL | Freq: Once | INTRAVENOUS | Status: AC
  Administered 2015-07-11: 1000 mL via INTRAVENOUS

## 2015-07-11 MED ORDER — ONDANSETRON HCL 4 MG/2ML IJ SOLN
4.0000 mg | Freq: Once | INTRAMUSCULAR | Status: AC
  Administered 2015-07-11: 4 mg via INTRAVENOUS
  Filled 2015-07-11: qty 2

## 2015-07-11 MED ORDER — ONDANSETRON 4 MG PO TBDP
4.0000 mg | ORAL_TABLET | Freq: Once | ORAL | Status: AC | PRN
  Administered 2015-07-11: 4 mg via ORAL
  Filled 2015-07-11: qty 1

## 2015-07-11 NOTE — ED Notes (Signed)
Patient reports N/V/D x all day. The patient states that her nieces and nephews as well as her son have had the same

## 2015-07-11 NOTE — ED Notes (Signed)
Zofran ODT ordered. Pt states she has had this medication before with no prior allergic reactions or other issues. Pharmacy consulted, and states ok to give med.

## 2015-07-11 NOTE — ED Provider Notes (Signed)
CSN: [id number]     Arrival date & time 07/11/15  2040 History   First MD Initiated Contact with Patient 07/11/15 2247     Chief Complaint  Patient presents with  . Abdominal Pain   HPI   25 year old female presents today with complaints of nausea vomiting diarrhea and abdominal pain. Patient reports that symptoms started this morning with diarrhea followed by vomiting. She reports numerous episodes throughout the evening, with the addition of body aches. She denies any fever, denies any urinary complaints. She reports she's having diffuse abdominal pain. She describes as achy and worsened with vomiting. Patient notes that numerous close sick contacts are experiencing similar symptoms. She denies any exposure to abnormal food or drink, alcohol or drugs.   Past Medical History  Diagnosis Date  . Chronic pain     muscle spasms- entire body, pinch nerve in upper back  . Infection     UTI -Resolved - History only  . Fracture, ankle     left- x2 - resolved  . Post traumatic stress disorder (PTSD)   . OCD (obsessive compulsive disorder)   . SVD (spontaneous vaginal delivery) 12/2012    x 1 at [facility]  . PONV (postoperative nausea and vomiting)   . Heartburn     zantac prn  . Hydronephrosis     History  . Headache(784.0)     otc med prn  . Anemia   . History of blood transfusion 12/2012    at [facility] - 1 unit transfused  . Arthritis     knee, ankle, left wrist  . Depression     doing fine now  . Anxiety     from OCD   Past Surgical History  Procedure Laterality Date  . Nephrostomy tube placement at 25wks and removed at 335/7 Right   . Cystoscopy with stent placement Right 12/25/2012    Procedure: CYSTOSCOPY WITH STENT PLACEMENT;  Surgeon: Marcine Matar, MD;  Location: [facility];  Service: Urology;  Laterality: Right;  . Video assisted thoracoscopy (vats)/decortication Right 01/08/2013    Procedure: VIDEO ASSISTED THORACOSCOPY DECORTICATION; Drainage of Pleural Effusion;  Surgeon:  Loreli Slot, MD;  Location: [facility] OR;  Service: Thoracic;  Laterality: Right;  . Laparoscopic bilateral salpingectomy Bilateral 03/31/2013    Procedure: LAPAROSCOPIC BILATERAL SALPINGECTOMY;  Surgeon: Lavina Hamman, MD;  Location: [facility];  Service: Gynecology;  Laterality: Bilateral;  . Tubal ligation    . Wisdom tooth extraction  06/2013  . Laparoscopy N/A 01/23/2014    Procedure: LAPAROSCOPY DIAGNOSTIC RIGHT PERITONEAL BIOPSY;  Surgeon: Lavina Hamman, MD;  Location: [facility];  Service: Gynecology;  Laterality: N/A;  . Laparoscopic assisted vaginal hysterectomy N/A 06/17/2014    Procedure: LAPAROSCOPIC ASSISTED VAGINAL HYSTERECTOMY;  Surgeon: Lavina Hamman, MD;  Location: [facility];  Service: Gynecology;  Laterality: N/A;  . Oophorectomy Bilateral 06/17/2014    Procedure: OOPHORECTOMY;  Surgeon: Lavina Hamman, MD;  Location: [facility];  Service: Gynecology;  Laterality: Bilateral;   Family History  Problem Relation Age of Onset  . Heart disease Mother   . Hypertension Father   . Cancer Paternal Grandmother     lung  . Heart disease Paternal Grandmother    Social History  Substance Use Topics  . Smoking status: Current Every Day Smoker -- 0.25 packs/day for 9 years    Types: Cigarettes  . Smokeless tobacco: Never Used     Comment: has cut down  . Alcohol Use: Yes     Comment: occasionally wine  OB History    Gravida Para Term Preterm AB TAB SAB Ectopic Multiple Living       Review of Systems  All other systems reviewed and are negative.   Allergies  Cinnamon; Shellfish allergy; Augmentin; Phenergan; and Nickel  Home Medications   Prior to Admission medications   Medication Sig Start Date End Date Taking? Authorizing Provider  amphetamine-dextroamphetamine (ADDERALL) 20 MG tablet Take 20 mg by mouth daily. 03/26/15 03/25/16  Historical Provider, MD  EPINEPHrine 0.3 mg/0.3 mL IJ SOAJ injection Use as directed for life-threatening allergic reaction. 04/06/15    Cristal Ford, MD  estrogens, conjugated, (PREMARIN) 0.3 MG tablet Take 0.3 mg by mouth as needed. 01/26/15 01/26/16  Historical Provider, MD  fluticasone (FLONASE) 50 MCG/ACT nasal spray Use one spray in each nostril one to two times daily as needed for runny nose and congestion. 04/06/15   Cristal Ford, MD  guaiFENesin-codeine 100-10 MG/5ML syrup Take 10 mLs by mouth every 6 (six) hours as needed for cough. 03/06/15   Geoffery Lyons, MD  ibuprofen (ADVIL,MOTRIN) 600 MG tablet Take 1 tablet (600 mg total) by mouth every 6 (six) hours as needed (mild pain). 06/17/14   Lavina Hamman, MD  LORazepam (ATIVAN) 0.5 MG tablet Take 0.5 mg by mouth as needed. 04/06/15   Historical Provider, MD  ondansetron (ZOFRAN ODT) 4 MG disintegrating tablet Take 1 tablet (4 mg total) by mouth every 8 (eight) hours as needed for nausea or vomiting. 07/12/15   Eyvonne Mechanic, PA-C  ranitidine (ZANTAC) 150 MG tablet Take 150 mg by mouth daily.    Historical Provider, MD   BP 122/74 mmHg  Pulse 99  Temp(Src) 98.3 F (36.8 C) (Oral)  Resp 18  Ht  (1.6 m)  Wt 77.111 kg  BMI 30.12 kg/m2  SpO2 97%  LMP 01/04/2014   Physical Exam  Constitutional: She is oriented to person, place, and time. She appears well-developed and well-nourished.  HENT:  Head: Normocephalic and atraumatic.  Eyes: Conjunctivae are normal. Pupils are equal, round, and reactive to light. Right eye exhibits no discharge. Left eye exhibits no discharge. No scleral icterus.  Neck: Normal range of motion. No JVD present. No tracheal deviation present.  Pulmonary/Chest: Effort normal. No stridor.  Abdominal: Soft. She exhibits no distension and no mass. There is tenderness. There is no rebound and no guarding.  Neurological: She is alert and oriented to person, place, and time. Coordination normal.  Psychiatric: She has a normal mood and affect. Her behavior is normal. Judgment and thought content normal.  Nursing note and vitals  reviewed.   ED Course  Procedures (including critical care time) Labs Review Labs Reviewed  CBC WITH DIFFERENTIAL/PLATELET - Abnormal; Notable for the following:    WBC 15.1 (*)    RBC 5.20 (*)    Neutro Abs 12.4 (*)    All other components within normal limits  URINALYSIS, ROUTINE W REFLEX MICROSCOPIC (NOT AT [facility])  PREGNANCY, URINE  COMPREHENSIVE METABOLIC PANEL  LIPASE, BLOOD    Imaging Review Dg Abd 1 View  07/12/2015  CLINICAL DATA:  25 year old female with nausea vomiting and diarrhea. Diffuse abdominal pain. EXAM: ABDOMEN - 1 VIEW COMPARISON:  CT dated 05/08/2013 FINDINGS: The bowel gas pattern is normal. No radio-opaque calculi or other significant radiographic abnormality are seen. IMPRESSION: Negative. Electronically Signed   By: Elgie Collard M.D.   On: 07/12/2015 00:28   I have personally reviewed  and evaluated these images and lab results as part of my medical decision-making.   EKG Interpretation None     MDM   Final diagnoses:  Gastroenteritis  Non-intractable vomiting with nausea, vomiting of unspecified type    Labs: CBC, CMP, lipase, urinalysis  Imaging:  Consults:  Therapeutics:  Discharge Meds:   Assessment/Plan:25 year old female presents today with complaints of nausea vomiting diarrhea and diffuse abdominal pain. Patient appeared acutely uncomfortable upon initial evaluation, even light palpation of the abdomen cause significant discomfort. IV fluids, antibiotics were ordered with plain films of her abdomen. Patient had IV placed, but wanted it removed as it was uncomfortable. She continued to have vomiting and diarrhea throughout her stay in the ED. Second IV was placed for hydration and anti-emetics. Patient unable to find relief with Zofran, refusing promethazine has this causes adverse reaction.  Repeat evaluation shows no more abdominal tenderness; patient continues to have nausea/ vomiting and diarrhea. Patient has not received her  original 1 L bolus at this time.  Patient care will be signed out to Dr. Read DriversMolpus pending fluid hydration, laboratory analysis and disposition.        Eyvonne MechanicJeffrey Sharnette Kitamura, PA-C 07/12/15 0135  Paula LibraJohn Molpus, MD 07/12/15 [phone]

## 2015-07-12 LAB — PREGNANCY, URINE: Preg Test, Ur: NEGATIVE

## 2015-07-12 LAB — COMPREHENSIVE METABOLIC PANEL
ALT: 22 U/L (ref 14–54)
AST: 19 U/L (ref 15–41)
Albumin: 4.2 g/dL (ref 3.5–5.0)
Alkaline Phosphatase: 98 U/L (ref 38–126)
Anion gap: 11 (ref 5–15)
BUN: 15 mg/dL (ref 6–20)
CO2: 20 mmol/L — ABNORMAL LOW (ref 22–32)
Calcium: 9.5 mg/dL (ref 8.9–10.3)
Chloride: 109 mmol/L (ref 101–111)
Creatinine, Ser: 0.61 mg/dL (ref 0.44–1.00)
GFR calc Af Amer: >60 mL/min (ref >60)
GFR calc non Af Amer: >60 mL/min (ref >60)
Glucose, Bld: 146 mg/dL — ABNORMAL HIGH (ref 65–99)
Potassium: 4.2 mmol/L (ref 3.5–5.1)
Sodium: 140 mmol/L (ref 135–145)
Total Bilirubin: 0.3 mg/dL (ref 0.3–1.2)
Total Protein: 7.2 g/dL (ref 6.5–8.1)

## 2015-07-12 LAB — CBC WITH DIFFERENTIAL/PLATELET
Basophils Absolute: 0 10*3/uL (ref 0.0–0.1)
Basophils Relative: 0 %
Eosinophils Absolute: 0.3 10*3/uL (ref 0.0–0.7)
Eosinophils Relative: 2 %
HCT: 40.9 % (ref 36.0–46.0)
Hemoglobin: 13.6 g/dL (ref 12.0–15.0)
Lymphocytes Relative: 12 %
Lymphs Abs: 1.7 10*3/uL (ref 0.7–4.0)
MCH: 26.2 pg (ref 26.0–34.0)
MCHC: 33.3 g/dL (ref 30.0–36.0)
MCV: 78.7 fL (ref 78.0–100.0)
Monocytes Absolute: 0.7 10*3/uL (ref 0.1–1.0)
Monocytes Relative: 5 %
Neutro Abs: 12.4 10*3/uL — ABNORMAL HIGH (ref 1.7–7.7)
Neutrophils Relative %: 81 %
Platelets: 282 10*3/uL (ref 150–400)
RBC: 5.2 MIL/uL — ABNORMAL HIGH (ref 3.87–5.11)
RDW: 13.8 % (ref 11.5–15.5)
WBC: 15.1 10*3/uL — ABNORMAL HIGH (ref 4.0–10.5)

## 2015-07-12 LAB — LIPASE, BLOOD: Lipase: 23 U/L (ref 11–51)

## 2015-07-12 MED ORDER — METOCLOPRAMIDE HCL 5 MG/ML IJ SOLN
10.0000 mg | Freq: Once | INTRAMUSCULAR | Status: AC
  Administered 2015-07-12: 10 mg via INTRAVENOUS
  Filled 2015-07-12: qty 2

## 2015-07-12 MED ORDER — ONDANSETRON HCL 4 MG PO TABS: 4.0000 mg | ORAL_TABLET | Freq: Three times a day (TID) | ORAL | Status: DC | PRN

## 2015-07-12 MED ORDER — ONDANSETRON 4 MG PO TBDP: 4.0000 mg | ORAL_TABLET | Freq: Three times a day (TID) | ORAL | Status: DC | PRN

## 2015-07-12 MED ORDER — DIPHENHYDRAMINE HCL 50 MG/ML IJ SOLN
25.0000 mg | Freq: Once | INTRAMUSCULAR | Status: AC
  Administered 2015-07-12: 25 mg via INTRAVENOUS
  Filled 2015-07-12: qty 1

## 2015-07-12 MED ORDER — SODIUM CHLORIDE 0.9 % IV BOLUS (SEPSIS)
1000.0000 mL | INTRAVENOUS | Status: AC
  Administered 2015-07-12 (×2): 1000 mL via INTRAVENOUS

## 2015-07-20 ENCOUNTER — Ambulatory Visit: Payer: Medicaid Other | Admitting: Allergy and Immunology

## 2016-03-04 ENCOUNTER — Encounter (HOSPITAL_BASED_OUTPATIENT_CLINIC_OR_DEPARTMENT_OTHER): Payer: Self-pay | Admitting: Emergency Medicine

## 2016-03-04 ENCOUNTER — Emergency Department (HOSPITAL_BASED_OUTPATIENT_CLINIC_OR_DEPARTMENT_OTHER)
Admission: EM | Admit: 2016-03-04 | Discharge: 2016-03-05 | Disposition: A | Discharge status: Home / Self Care | Payer: Commercial Managed Care - HMO | Attending: Emergency Medicine | Admitting: Emergency Medicine

## 2016-03-04 ENCOUNTER — Emergency Department (HOSPITAL_BASED_OUTPATIENT_CLINIC_OR_DEPARTMENT_OTHER): Payer: Commercial Managed Care - HMO

## 2016-03-04 DIAGNOSIS — Z79899 Other long term (current) drug therapy: Secondary | ICD-10-CM | POA: Insufficient documentation

## 2016-03-04 DIAGNOSIS — F17210 Nicotine dependence, cigarettes, uncomplicated: Secondary | ICD-10-CM | POA: Diagnosis not present

## 2016-03-04 DIAGNOSIS — M549 Dorsalgia, unspecified: Secondary | ICD-10-CM | POA: Insufficient documentation

## 2016-03-04 DIAGNOSIS — R109 Unspecified abdominal pain: Secondary | ICD-10-CM | POA: Insufficient documentation

## 2016-03-04 LAB — URINE MICROSCOPIC-ADD ON

## 2016-03-04 LAB — URINALYSIS, ROUTINE W REFLEX MICROSCOPIC
BILIRUBIN URINE: NEGATIVE
GLUCOSE, UA: NEGATIVE mg/dL
Hgb urine dipstick: NEGATIVE
KETONES UR: NEGATIVE mg/dL
Nitrite: NEGATIVE
PH: 7 (ref 5.0–8.0)
Protein, ur: NEGATIVE mg/dL
Specific Gravity, Urine: 1.025 (ref 1.005–1.030)

## 2016-03-04 MED ORDER — ONDANSETRON 4 MG PO TBDP
4.0000 mg | ORAL_TABLET | Freq: Once | ORAL | Status: AC
  Administered 2016-03-04: 4 mg via ORAL
  Filled 2016-03-04: qty 1

## 2016-03-04 MED ORDER — TRAMADOL HCL 50 MG PO TABS
50.0000 mg | ORAL_TABLET | Freq: Once | ORAL | Status: AC
  Administered 2016-03-04: 50 mg via ORAL
  Filled 2016-03-04: qty 1

## 2016-03-04 NOTE — ED Triage Notes (Signed)
Pt states history of hydronephrosis and kidney surgeries and that her right kidney began hurting at approx 6am today.  Pt states some urinary symptoms but that those are normal for her.

## 2016-03-04 NOTE — ED Provider Notes (Signed)
[facility] Provider Note   CSN: [id number] Arrival date & time: 03/04/16  2257   By signing my name below, I, Clarisse GougeXavier Herndon, attest that this documentation has been prepared under the direction and in the presence of Fayrene HelperBowie Inioluwa Baris. Electronically Signed: Clarisse GougeXavier Herndon, Scribe. 03/04/16. 11:36 PM.   History   Chief Complaint Chief Complaint  Patient presents with  . Back Pain   The history is provided by the patient. No language interpreter was used.    HPI Comments: [name] [name] is a 25 y.o. female with Hx of hydronephrosis, full hysterectomy and multiple kidney surgeries who presents to the Emergency Department complaining of sudden onset, constant, moderate right flank pain beginning ~6:00AM today. She reports renal failure 3 years ago during pregnancy. She further reports associated throbbing and painful pressure to the affected area, nausea and appetite change. She states that she can "feel it swelling". Pt was last seen for similar symptoms at [facility]. She notes attempting to alleviate pain using tylenol, ibuprofen, and application of heat with no relief. Pt denies pain radiation, vomit, urinary symptoms significantly different from baseline, injury to the affected area, vaginal symptoms and Hx of kidney stones. No alleviating factors noted.       Past Medical History:  Diagnosis Date  . Anemia   . Anxiety    from OCD  . Arthritis    knee, ankle, left wrist  . Chronic pain    muscle spasms- entire body, pinch nerve in upper back  . Depression    doing fine now  . Fracture, ankle    left- x2 - resolved  . Headache(784.0)    otc med prn  . Heartburn    zantac prn  . History of blood transfusion 12/2012   at [facility] - 1 unit transfused  . Hydronephrosis    History  . Infection    UTI -Resolved - History only  . OCD (obsessive compulsive disorder)   . PONV (postoperative nausea and vomiting)   . Post traumatic stress disorder (PTSD)   .  SVD (spontaneous vaginal delivery) 12/2012   [facility]    Patient Active Problem List   Diagnosis Date Noted  . Allergic reactions 04/06/2015  . Chronic rhinitis 04/06/2015  . Elevated blood pressure reading without diagnosis of hypertension 04/06/2015  . 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 S/P hysterectomy 06/17/2014  . Pelvic pain in female 01/23/2014  . Contraception management 03/31/2013  . AKI (acute kidney injury) (HCC) 01/04/2013  . 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  . Hydronephrosis, right 12/30/2012  . Pleuritic chest pain 12/30/2012  . Pneumonia 12/30/2012  . Primary dysmenorrhea 03/07/2012    Past Surgical History:  Procedure Laterality Date  . CYSTOSCOPY WITH STENT PLACEMENT Right 12/25/2012   Procedure: CYSTOSCOPY WITH STENT PLACEMENT;  Surgeon: Marcine MatarStephen Dahlstedt, MD;  Location: [facility];  Service: Urology;  Laterality: Right;  . LAPAROSCOPIC ASSISTED VAGINAL HYSTERECTOMY N/A 06/17/2014   Procedure: LAPAROSCOPIC ASSISTED VAGINAL HYSTERECTOMY;  Surgeon: Lavina Hammanodd Meisinger, MD;  Location: [facility];  Service: Gynecology;  Laterality: N/A;  . LAPAROSCOPIC BILATERAL SALPINGECTOMY Bilateral 03/31/2013   Procedure: LAPAROSCOPIC BILATERAL SALPINGECTOMY;  Surgeon: Lavina Hammanodd Meisinger, MD;  Location: [facility];  Service: Gynecology;  Laterality: Bilateral;  . LAPAROSCOPY N/A 01/23/2014   Procedure: LAPAROSCOPY DIAGNOSTIC RIGHT PERITONEAL BIOPSY;  Surgeon: Lavina Hammanodd Meisinger, MD;  Location: [facility];  Service: Gynecology;  Laterality: N/A;  . nephrostomy tube placement at 25wks and removed at 335/7 Right   .  OOPHORECTOMY Bilateral 06/17/2014   Procedure: OOPHORECTOMY;  Surgeon: Lavina Hamman, MD;  Location: [facility];  Service: Gynecology;  Laterality: Bilateral;  . TUBAL LIGATION    . VIDEO ASSISTED THORACOSCOPY (VATS)/DECORTICATION Right 01/08/2013   Procedure: VIDEO ASSISTED THORACOSCOPY DECORTICATION; Drainage of Pleural Effusion;  Surgeon: Loreli Slot, MD;  Location: [facility] OR;  Service:  Thoracic;  Laterality: Right;  . WISDOM TOOTH EXTRACTION  06/2013    OB History    Gravida Para Term Preterm AB Living   2 1 0 0 0 0   SAB TAB Ectopic Multiple Live Births   0 0 0 0         Home Medications    Prior to Admission medications   Medication Sig Start Date End Date Taking? Authorizing Provider  amphetamine-dextroamphetamine (ADDERALL) 20 MG tablet Take 20 mg by mouth daily. 03/26/15 03/25/16 Yes Historical Provider, MD  EPINEPHrine 0.3 mg/0.3 mL IJ SOAJ injection Use as directed for life-threatening allergic reaction. 04/06/15  Yes Cristal Ford, MD  LORazepam (ATIVAN) 0.5 MG tablet Take 0.5 mg by mouth as needed. 04/06/15  Yes Historical Provider, MD  ranitidine (ZANTAC) 150 MG tablet Take 150 mg by mouth daily.   Yes Historical Provider, MD  estrogens, conjugated, (PREMARIN) 0.3 MG tablet Take 0.3 mg by mouth as needed. 01/26/15 01/26/16  Historical Provider, MD  fluticasone (FLONASE) 50 MCG/ACT nasal spray Use one spray in each nostril one to two times daily as needed for runny nose and congestion. 04/06/15   Cristal Ford, MD  guaiFENesin-codeine 100-10 MG/5ML syrup Take 10 mLs by mouth every 6 (six) hours as needed for cough. 03/06/15   Geoffery Lyons, MD  ibuprofen (ADVIL,MOTRIN) 600 MG tablet Take 1 tablet (600 mg total) by mouth every 6 (six) hours as needed (mild pain). 06/17/14   Lavina Hamman, MD  ondansetron (ZOFRAN) 4 MG tablet Take 1-2 tablets (4-8 mg total) by mouth every 8 (eight) hours as needed for nausea or vomiting. 07/12/15   [name], MD    Family History Family History  Problem Relation Age of Onset  . Heart disease Mother   . Hypertension Father   . Cancer Paternal Grandmother     lung  . Heart disease Paternal Grandmother     Social History Social History  Substance Use Topics  . Smoking status: Current Every Day Smoker    Packs/day: 0.25    Years: 9.00    Types: Cigarettes  . Smokeless tobacco: Never Used     Comment: has  cut down  . Alcohol use Yes     Comment: occasionally wine     Allergies   Cinnamon; Shellfish allergy; Augmentin [amoxicillin-pot clavulanate]; Phenergan [promethazine hcl]; and Nickel   Review of Systems Review of Systems  Musculoskeletal: Positive for back pain.     Physical Exam Updated Vital Signs LMP 01/04/2014   Physical Exam  Constitutional: She is oriented to person, place, and time. She appears well-developed and well-nourished.  HENT:  Head: Normocephalic.  Eyes: EOM are normal.  Neck: Normal range of motion.  Cardiovascular: Normal heart sounds and normal pulses.   Pulmonary/Chest: Effort normal and breath sounds normal.  Abdominal: Soft. Bowel sounds are normal. She exhibits no distension. There is no tenderness. There is CVA tenderness (right side).  Musculoskeletal: Normal range of motion.  Neurological: She is alert and oriented to person, place, and time.  Psychiatric: She has a normal mood and affect.  Nursing note and vitals reviewed.  ED Treatments / Results  DIAGNOSTIC STUDIES: Oxygen Saturation is 100% on room air, normal by my interpretation.    COORDINATION OF CARE: 11:36 PM Will order CT, urinalysis and pain medications. Discussed treatment plan with pt at bedside and pt agreed to plan.   Labs (all labs ordered are listed, but only abnormal results are displayed) Labs Reviewed  URINALYSIS, ROUTINE W REFLEX MICROSCOPIC (NOT AT [facility]) - Abnormal; Notable for the following:       Result Value   APPearance CLOUDY (*)    Leukocytes, UA SMALL (*)    All other components within normal limits  BASIC METABOLIC PANEL - Abnormal; Notable for the following:    Glucose, Bld 112 (*)    All other components within normal limits  CBC WITH DIFFERENTIAL/PLATELET - Abnormal; Notable for the following:    Lymphs Abs 4.5 (*)    All other components within normal limits  URINE MICROSCOPIC-ADD ON - Abnormal; Notable for the following:    Squamous  Epithelial / LPF 0-5 (*)    Bacteria, UA FEW (*)    All other components within normal limits    EKG  EKG Interpretation None       Radiology Ct Renal Stone Study  Result Date: 03/05/2016 CLINICAL DATA:  Right flank pain. EXAM: CT ABDOMEN AND PELVIS WITHOUT CONTRAST TECHNIQUE: Multidetector CT imaging of the abdomen and pelvis was performed following the standard protocol without IV contrast. COMPARISON:  Abdominal radiograph 07/11/2015, CT abdomen pelvis 05/08/2013 FINDINGS: Lower chest: No pulmonary nodules or pleural effusion. No visible pericardial effusion. Hepatobiliary: Normal noncontrast appearance of the liver. No visible biliary dilatation. Normal gallbladder. Pancreas: Normal noncontrast appearance of the pancreas. No peripancreatic fluid collection. Spleen: Normal. Adrenal glands: Normal. Urinary Tract: --Right kidney: Nonobstructing renal stone near the lower pole measures approximately 2 mm. No hydronephrosis. No ureteral obstruction. --Left kidney: No hydronephrosis or perinephric stranding. No nephrolithiasis. No obstructing ureteral stones. --Urinary bladder: Unremarkable. Stomach/Bowel: No dilated loops of bowel. No evidence of colonic or enteric inflammation. No fluid collection within the abdomen. Vascular/Lymphatic: No abdominal aortic aneurysm or atherosclerotic calcification. No abdominal or pelvic lymphadenopathy. Reproductive: Status post hysterectomy and bilateral salpingo oophorectomy. Musculoskeletal. Right-sided lumbosacral assimilation joint. No lytic or blastic lesions. No bony spinal canal stenosis. IMPRESSION: 1. Nonobstructing 2 mm stone near the lower pole of the right kidney. No obstructive uropathy. 2. No acute abnormality of the abdomen or pelvis. Electronically Signed   By: Deatra Robinson M.D.   On: 03/05/2016 00:21    Procedures Procedures (including critical care time)  Medications Ordered in ED Medications  traMADol (ULTRAM) tablet 50 mg (50 mg Oral  Given 03/04/16 2332)     Initial Impression / Assessment and Plan / ED Course  I have reviewed the triage vital signs and the nursing notes.  Pertinent labs & imaging results that were available during my care of the patient were reviewed by me and considered in my medical decision making (see chart for details).  Clinical Course     Patient with back pain.  No neurological deficits and normal neuro exam.  Patient is ambulatory.  No loss of bowel or bladder control.  No concern for cauda equina.  No fever, night sweats, weight loss, h/o cancer, IVDA, no recent procedure to back. No urinary symptoms suggestive of UTI.  Supportive care and return precaution discussed. Appears safe for discharge at this time. Follow up as indicated in discharge paperwork.    Final Clinical Impressions(s) / ED Diagnoses  Final diagnoses:  Right flank pain    New Prescriptions New Prescriptions   METHOCARBAMOL (ROBAXIN) 500 MG TABLET    Take 1 tablet (500 mg total) by mouth 2 (two) times daily.   NAPROXEN (NAPROSYN) 500 MG TABLET    Take 1 tablet (500 mg total) by mouth 2 (two) times daily.  I personally performed the services described in this documentation, which was scribed in my presence. The recorded information has been reviewed and is accurate.   BP 118/76 (BP Location: Right Arm)   Pulse 81   Resp 17   LMP 01/04/2014   SpO2 100%    12:29 AM Pt with hx of hydronephrosis presents with R flank pain.  Her labs are reassuring, normal renal function.  CT renal stone study demonstrates a 2mm non obstructive kidney stone on the R kidney, no evidence of hydronephrosis.  No other concerning finding on the CT scan.  Pt is resting comfortably, no acute distress.  Will provide sxs treatment and outpt f/u with urology for further care.  Return precaution discussed.     Fayrene HelperBowie Elisabet Gutzmer, PA-C 03/05/16 0044    Paula LibraJohn Molpus, MD 03/05/16 [id number]

## 2016-03-05 LAB — CBC WITH DIFFERENTIAL/PLATELET
BASOS ABS: 0 10*3/uL (ref 0.0–0.1)
BASOS PCT: 0 %
EOS PCT: 4 %
Eosinophils Absolute: 0.4 10*3/uL (ref 0.0–0.7)
HEMATOCRIT: 38.4 % (ref 36.0–46.0)
Hemoglobin: 12.5 g/dL (ref 12.0–15.0)
LYMPHS PCT: 44 %
Lymphs Abs: 4.5 10*3/uL — ABNORMAL HIGH (ref 0.7–4.0)
MCH: 26.6 pg (ref 26.0–34.0)
MCHC: 32.6 g/dL (ref 30.0–36.0)
MCV: 81.7 fL (ref 78.0–100.0)
MONO ABS: 0.7 10*3/uL (ref 0.1–1.0)
MONOS PCT: 7 %
Neutro Abs: 4.7 10*3/uL (ref 1.7–7.7)
Neutrophils Relative %: 45 %
PLATELETS: 305 10*3/uL (ref 150–400)
RBC: 4.7 MIL/uL (ref 3.87–5.11)
RDW: 13 % (ref 11.5–15.5)
WBC: 10.3 10*3/uL (ref 4.0–10.5)

## 2016-03-05 LAB — BASIC METABOLIC PANEL
Anion gap: 8 (ref 5–15)
BUN: 15 mg/dL (ref 6–20)
CALCIUM: 9.4 mg/dL (ref 8.9–10.3)
CO2: 25 mmol/L (ref 22–32)
Chloride: 107 mmol/L (ref 101–111)
Creatinine, Ser: 0.87 mg/dL (ref 0.44–1.00)
GFR calc Af Amer: >60 mL/min (ref >60)
GLUCOSE: 112 mg/dL — AB (ref 65–99)
Potassium: 3.8 mmol/L (ref 3.5–5.1)
Sodium: 140 mmol/L (ref 135–145)

## 2016-03-05 MED ORDER — METHOCARBAMOL 500 MG PO TABS: 500.0000 mg | ORAL_TABLET | Freq: Two times a day (BID) | ORAL | 0 refills | Status: DC

## 2016-03-05 MED ORDER — NAPROXEN 500 MG PO TABS: 500.0000 mg | ORAL_TABLET | Freq: Two times a day (BID) | ORAL | 0 refills | Status: DC

## 2016-03-05 NOTE — Discharge Instructions (Signed)
You have a 2mm kidney stone on the right side.  Please follow up with urology as needed for your flank pain.

## 2016-04-15 ENCOUNTER — Encounter (HOSPITAL_BASED_OUTPATIENT_CLINIC_OR_DEPARTMENT_OTHER): Payer: Self-pay | Admitting: Emergency Medicine

## 2016-04-15 ENCOUNTER — Emergency Department (HOSPITAL_BASED_OUTPATIENT_CLINIC_OR_DEPARTMENT_OTHER)
Admission: EM | Admit: 2016-04-15 | Discharge: 2016-04-15 | Disposition: A | Discharge status: Home / Self Care | Payer: Commercial Managed Care - HMO | Attending: Emergency Medicine | Admitting: Emergency Medicine

## 2016-04-15 DIAGNOSIS — J069 Acute upper respiratory infection, unspecified: Secondary | ICD-10-CM | POA: Insufficient documentation

## 2016-04-15 DIAGNOSIS — B9789 Other viral agents as the cause of diseases classified elsewhere: Secondary | ICD-10-CM

## 2016-04-15 DIAGNOSIS — Z87891 Personal history of nicotine dependence: Secondary | ICD-10-CM | POA: Diagnosis not present

## 2016-04-15 DIAGNOSIS — Z79899 Other long term (current) drug therapy: Secondary | ICD-10-CM | POA: Insufficient documentation

## 2016-04-15 DIAGNOSIS — R05 Cough: Secondary | ICD-10-CM | POA: Diagnosis present

## 2016-04-15 LAB — RAPID STREP SCREEN (MED CTR MEBANE ONLY): Streptococcus, Group A Screen (Direct): NEGATIVE

## 2016-04-15 MED ORDER — GUAIFENESIN-CODEINE 100-10 MG/5ML PO SOLN: 5.0000 mL | Freq: Three times a day (TID) | ORAL | 0 refills | Status: DC | PRN

## 2016-04-15 MED ORDER — DEXAMETHASONE SODIUM PHOSPHATE 10 MG/ML IJ SOLN
10.0000 mg | Freq: Once | INTRAMUSCULAR | Status: AC
  Administered 2016-04-15: 10 mg via INTRAMUSCULAR
  Filled 2016-04-15: qty 1

## 2016-04-15 MED ORDER — BENZONATATE 100 MG PO CAPS
100.0000 mg | ORAL_CAPSULE | Freq: Once | ORAL | Status: AC
  Administered 2016-04-15: 100 mg via ORAL
  Filled 2016-04-15: qty 1

## 2016-04-15 MED ORDER — FLUTICASONE PROPIONATE 50 MCG/ACT NA SUSP: 2.0000 | Freq: Every day | NASAL | 0 refills | Status: DC

## 2016-04-15 MED ORDER — DOXYCYCLINE HYCLATE 100 MG PO CAPS: 100.0000 mg | ORAL_CAPSULE | Freq: Two times a day (BID) | ORAL | 0 refills | Status: DC

## 2016-04-15 NOTE — ED Notes (Signed)
Pt given d/c instructions as per chart. Rx x 3. Verbalizes understanding. No questions. 

## 2016-04-15 NOTE — ED Triage Notes (Signed)
Has had cough, fever, sore throat since 12/26 and went to primary MD on Tuesday and was told had a sinus infection but no medicines prescribed.

## 2016-04-15 NOTE — ED Notes (Signed)
Pt c/o cough, laryngitis, head and sinus pressure and sore throat x 5 days.Saw PCP and was told she had the flu, but paperwork said sinus infection. Was not given any meds.

## 2016-04-15 NOTE — Discharge Instructions (Signed)
This is likely a viral upper respiratory tract infection. Continues with cough medicine to use. This will make you drowsy so do not drive with it. I'm also giving use of Flonase which is in intranasal steroid. I have given you a prescirption for abx but do not use unless your symptoms last for longer than 10-12 days. I would also recommend over the counter pseudoephedrine as discussed. Please stay hydrated. Follow up with your primary care doctor if your symptoms do not improve. Return to the ED if you symptoms worsen.

## 2016-04-16 NOTE — ED Provider Notes (Signed)
[facility]   CSN: [id number] Arrival date & time: 04/15/16  1426     History   Chief Complaint Chief Complaint  Patient presents with  . Cough    HPI [name] [name] is a 25 y.o. female.  25 year old Caucasian female with no significant past medical history presents to the ED today for cough, laryngitis, sinus pressure, sore throat, rhinorrhea x 5 days. Saw her PCP approx 2 days ago and told she had sinus infection. Patient states symptoms persists. Denies any fever or muscle aches. Endorses sick contact. She had tried otc meds without any relief. Cough is non productive. Rhinorrhea is greens. Endorses sinus pressure. Denies any ha, vision changes, sob, cp, abd pain, n/v/d, or any other associated symptoms.     Cough  Associated symptoms include rhinorrhea and sore throat. Pertinent negatives include no chest pain, no chills, no ear pain, no headaches and no shortness of breath.    Past Medical History:  Diagnosis Date  . Anemia   . Anxiety    from OCD  . Arthritis    knee, ankle, left wrist  . Chronic pain    muscle spasms- entire body, pinch nerve in upper back  . Depression    doing fine now  . Fracture, ankle    left- x2 - resolved  . Headache(784.0)    otc med prn  . Heartburn    zantac prn  . History of blood transfusion 12/2012   [facility] - 1 unit transfused  . Hydronephrosis    History  . Infection    UTI -Resolved - History only  . OCD (obsessive compulsive disorder)   . PONV (postoperative nausea and vomiting)   . Post traumatic stress disorder (PTSD)   . SVD (spontaneous vaginal delivery) 12/2012   [facility]    Patient Active Problem List   Diagnosis Date Noted  . Allergic reactions 04/06/2015  . Chronic rhinitis 04/06/2015  . Elevated blood pressure reading without diagnosis of hypertension 04/06/2015  . 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 S/P hysterectomy 06/17/2014  . Pelvic pain in female  01/23/2014  . Contraception management 03/31/2013  . AKI (acute kidney injury) (HCC) 01/04/2013  . 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  . Hydronephrosis, right 12/30/2012  . Pleuritic chest pain 12/30/2012  . Pneumonia 12/30/2012  . Primary dysmenorrhea 03/07/2012    Past Surgical History:  Procedure Laterality Date  . CYSTOSCOPY WITH STENT PLACEMENT Right 12/25/2012   Procedure: CYSTOSCOPY WITH STENT PLACEMENT;  Surgeon: Marcine MatarStephen Dahlstedt, MD;  Location: [facility];  Service: Urology;  Laterality: Right;  . LAPAROSCOPIC ASSISTED VAGINAL HYSTERECTOMY N/A 06/17/2014   Procedure: LAPAROSCOPIC ASSISTED VAGINAL HYSTERECTOMY;  Surgeon: Lavina Hammanodd Meisinger, MD;  Location: [facility];  Service: Gynecology;  Laterality: N/A;  . LAPAROSCOPIC BILATERAL SALPINGECTOMY Bilateral 03/31/2013   Procedure: LAPAROSCOPIC BILATERAL SALPINGECTOMY;  Surgeon: Lavina Hammanodd Meisinger, MD;  Location: [facility];  Service: Gynecology;  Laterality: Bilateral;  . LAPAROSCOPY N/A 01/23/2014   Procedure: LAPAROSCOPY DIAGNOSTIC RIGHT PERITONEAL BIOPSY;  Surgeon: Lavina Hammanodd Meisinger, MD;  Location: [facility];  Service: Gynecology;  Laterality: N/A;  . nephrostomy tube placement at 25wks and removed at 335/7 Right   . OOPHORECTOMY Bilateral 06/17/2014   Procedure: OOPHORECTOMY;  Surgeon: Lavina Hammanodd Meisinger, MD;  Location: [facility];  Service: Gynecology;  Laterality: Bilateral;  . TUBAL LIGATION    . VIDEO ASSISTED THORACOSCOPY (VATS)/DECORTICATION Right 01/08/2013   Procedure: VIDEO ASSISTED THORACOSCOPY DECORTICATION; Drainage of Pleural Effusion;  Surgeon:  Loreli SlotSteven C Hendrickson, MD;  [facility];  Service: Thoracic;  Laterality: Right;  . WISDOM TOOTH EXTRACTION  06/2013    OB History    Gravida Para Term Preterm AB Living   2 1 0 0 0 0   SAB TAB Ectopic Multiple Live Births   0 0 0 0         Home Medications    Prior to Admission medications   Medication Sig Start Date End Date Taking? Authorizing Provider  amphetamine-dextroamphetamine (ADDERALL) 20 MG  tablet Take 20 mg by mouth daily. 03/26/15 04/15/16 Yes Historical Provider, MD  EPINEPHrine 0.3 mg/0.3 mL IJ SOAJ injection Use as directed for life-threatening allergic reaction. 04/06/15  Yes Cristal Fordalph Carter Bobbitt, MD  LORazepam (ATIVAN) 0.5 MG tablet Take 0.5 mg by mouth as needed. 04/06/15  Yes Historical Provider, MD  naproxen (NAPROSYN) 500 MG tablet Take 1 tablet (500 mg total) by mouth 2 (two) times daily. 03/05/16  Yes Fayrene HelperBowie Tran, PA-C  ranitidine (ZANTAC) 150 MG tablet Take 150 mg by mouth daily.   Yes Historical Provider, MD  doxycycline (VIBRAMYCIN) 100 MG capsule Take 1 capsule (100 mg total) by mouth 2 (two) times daily. 04/15/16   Rise MuKenneth T Leaphart, PA-C  estrogens, conjugated, (PREMARIN) 0.3 MG tablet Take 0.3 mg by mouth as needed. 01/26/15 01/26/16  Historical Provider, MD  fluticasone (FLONASE) 50 MCG/ACT nasal spray Place 2 sprays into both nostrils daily. 04/15/16   Rise MuKenneth T Leaphart, PA-C  guaiFENesin-codeine 100-10 MG/5ML syrup Take 5 mLs by mouth 3 (three) times daily as needed for cough. 04/15/16   Rise MuKenneth T Leaphart, PA-C  ibuprofen (ADVIL,MOTRIN) 600 MG tablet Take 1 tablet (600 mg total) by mouth every 6 (six) hours as needed (mild pain). 06/17/14   Lavina Hammanodd Meisinger, MD  methocarbamol (ROBAXIN) 500 MG tablet Take 1 tablet (500 mg total) by mouth 2 (two) times daily. 03/05/16   Fayrene HelperBowie Tran, PA-C  ondansetron (ZOFRAN) 4 MG tablet Take 1-2 tablets (4-8 mg total) by mouth every 8 (eight) hours as needed for nausea or vomiting. 07/12/15   Paula LibraJohn Molpus, MD    Family History Family History  Problem Relation Age of Onset  . Heart disease Mother   . Hypertension Father   . Cancer Paternal Grandmother     lung  . Heart disease Paternal Grandmother     Social History Social History  Substance Use Topics  . Smoking status: Former Smoker    Packs/day: 0.25    Years: 9.00    Types: Cigarettes  . Smokeless tobacco: Never Used     Comment: has cut down  . Alcohol use Yes      Comment: occasionally wine     Allergies   Cinnamon; Shellfish allergy; Augmentin [amoxicillin-pot clavulanate]; Phenergan [promethazine hcl]; and Nickel   Review of Systems Review of Systems  Constitutional: Negative for chills and fever.  HENT: Positive for congestion, postnasal drip, rhinorrhea, sinus pain, sinus pressure, sore throat and voice change. Negative for ear pain.   Eyes: Negative for visual disturbance.  Respiratory: Positive for cough. Negative for shortness of breath.   Cardiovascular: Negative for chest pain.  Gastrointestinal: Negative for abdominal pain, diarrhea, nausea and vomiting.  Neurological: Negative for dizziness, weakness, light-headedness, numbness and headaches.  All other systems reviewed and are negative.    Physical Exam Updated Vital Signs BP 144/88 (BP Location: Left Arm)   Pulse 108   Temp 97.9 F (36.6 C) (Oral)   Resp 18   Ht 5'  3" (1.6 m)   Wt 77.1 kg   LMP 01/04/2014   SpO2 100%   BMI 30.11 kg/m   Physical Exam  Constitutional: She appears well-developed and well-nourished. No distress.  HENT:  Head: Normocephalic and atraumatic.  Right Ear: Tympanic membrane, external ear and ear canal normal.  Left Ear: Tympanic membrane, external ear and ear canal normal.  Nose: Mucosal edema and rhinorrhea present. Right sinus exhibits maxillary sinus tenderness and frontal sinus tenderness. Left sinus exhibits maxillary sinus tenderness and frontal sinus tenderness.  Mouth/Throat: Uvula is midline and mucous membranes are normal. No trismus in the jaw. Posterior oropharyngeal erythema present. No oropharyngeal exudate or posterior oropharyngeal edema. Tonsils are 0 on the right. Tonsils are 0 on the left. No tonsillar exudate.  No trismus. Hoar sed voice but no uvula deviation.  Eyes: Conjunctivae are normal. Pupils are equal, round, and reactive to light. Right eye exhibits no discharge. Left eye exhibits no discharge.  Neck: Normal range of  motion. Neck supple.  Lymphadenopathy:    She has no cervical adenopathy.  Skin: Skin is warm and dry. Capillary refill takes less than 2 seconds.  Nursing note and vitals reviewed.    ED Treatments / Results  Labs (all labs ordered are listed, but only abnormal results are displayed) Labs Reviewed  RAPID STREP SCREEN (NOT AT [facility])  CULTURE, GROUP A STREP [facility])    EKG  EKG Interpretation None       Radiology No results found.  Procedures Procedures (including critical care time)  Medications Ordered in ED Medications  dexamethasone (DECADRON) injection 10 mg (10 mg Intramuscular Given 04/15/16 1621)  benzonatate (TESSALON) capsule 100 mg (100 mg Oral Given 04/15/16 1621)     Initial Impression / Assessment and Plan / ED Course  I have reviewed the triage vital signs and the nursing notes.  Pertinent labs & imaging results that were available during my care of the patient were reviewed by me and considered in my medical decision making (see chart for details).  Clinical Course   Patient complaining of symptoms of sinusitis.  Mild to moderate symptoms of clear/yellow nasal discharge/congestion and scratchy throat with cough for less than 10 days. Lungs CTAB. No imaging indicated at this time.  Patient is afebrile.  No concern for acute bacterial rhinosinusitis; likely viral in nature.  Patient discharged with symptomatic treatment.  Given rx if symptoms last longer than 10 days to get refilled. Patient instructions given for warm saline nasal washes.  Recommendations for follow-up with primary care physician.     Final Clinical Impressions(s) / ED Diagnoses   Final diagnoses:  Viral URI with cough    New Prescriptions Discharge Medication List as of 04/15/2016  4:20 PM    START taking these medications   Details  doxycycline (VIBRAMYCIN) 100 MG capsule Take 1 capsule (100 mg total) by mouth 2 (two) times daily., Starting Sat 04/15/2016, Print           Rise Mu, PA-C 04/18/16 1350    Rolland Porter, MD 04/29/16 1450

## 2016-04-18 LAB — CULTURE, GROUP A STREP (THRC)

## 2016-04-25 ENCOUNTER — Emergency Department (HOSPITAL_BASED_OUTPATIENT_CLINIC_OR_DEPARTMENT_OTHER)
Admission: EM | Admit: 2016-04-25 | Discharge: 2016-04-25 | Disposition: A | Discharge status: Home / Self Care | Payer: Commercial Managed Care - HMO | Attending: Emergency Medicine | Admitting: Emergency Medicine

## 2016-04-25 ENCOUNTER — Emergency Department (HOSPITAL_BASED_OUTPATIENT_CLINIC_OR_DEPARTMENT_OTHER): Payer: Commercial Managed Care - HMO

## 2016-04-25 ENCOUNTER — Encounter (HOSPITAL_BASED_OUTPATIENT_CLINIC_OR_DEPARTMENT_OTHER): Payer: Self-pay

## 2016-04-25 DIAGNOSIS — R05 Cough: Secondary | ICD-10-CM | POA: Insufficient documentation

## 2016-04-25 DIAGNOSIS — R0789 Other chest pain: Secondary | ICD-10-CM

## 2016-04-25 DIAGNOSIS — Z87891 Personal history of nicotine dependence: Secondary | ICD-10-CM | POA: Diagnosis not present

## 2016-04-25 NOTE — ED Provider Notes (Signed)
[facility] Note   CSN: [id number] Arrival date & time: 04/25/16  1805  By signing my name below, I, Linna DarnerRussell Turner, attest that this documentation has been prepared under the direction and in the presence of physician practitioner, Melene Planan Dariann Huckaba, DO. Electronically Signed: Linna Darnerussell Turner, Scribe. 04/25/2016. 7:05 PM.  History   Chief Complaint Chief Complaint  Patient presents with  . Chest Pain    The history is provided by the patient. No language interpreter was used.  Chest Pain   This is a new problem. The current episode started 3 to 5 hours ago. The problem occurs constantly. The problem has not changed since onset.The pain is moderate. The quality of the pain is described as dull, sharp and stabbing. The pain does not radiate. The symptoms are aggravated by deep breathing. Associated symptoms include cough. Pertinent negatives include no abdominal pain, no fever, no hemoptysis and no sputum production. She has tried nothing for the symptoms. There are no known risk factors.  Her past medical history is significant for anxiety/panic attacks.  Pertinent negatives for past medical history include no diabetes, no hyperlipidemia and no hypertension.     HPI Comments: [name] [name] is a 26 y.o. female with PMHx significant for pleuritic chest pain and pneumonia who presents to the Emergency Department complaining of sudden onset, constant, sharp, stabbing, right-sided chest pain beginning around 4 PM today. She states she was driving when her chest pain presented. Pt reports her CP is dull without breathing, but when she breathes it is sharp and stabbing. She states her CP is worse with applied pressure to the right side of her chest. Pt notes a recent dry cough but states coughing does not exacerbate her CP. No h/o blood clot, HTN, HLD, or DM. She notes her father had a heart attack in his 650's; no other FMHx of heart attack. No personal h/o cancer. She is a former smoker.  No recent injury or trauma to her chest. No recent unusual activity or exertion. No recent surgeries. She does not use hormone therapy and notes she has had a full hysterectomy. She denies leg swelling, congestion, fever, chills, abdominal pain, hemoptysis, or any other associated symptoms.  Past Medical History:  Diagnosis Date  . Anemia   . Anxiety    from OCD  . Arthritis    knee, ankle, left wrist  . Chronic pain    muscle spasms- entire body, pinch nerve in upper back  . Depression    doing fine now  . Fracture, ankle    left- x2 - resolved  . Headache(784.0)    otc med prn  . Heartburn    zantac prn  . History of blood transfusion 12/2012   [facility] - 1 unit transfused  . Hydronephrosis    History  . Infection    UTI -Resolved - History only  . OCD (obsessive compulsive disorder)   . PONV (postoperative nausea and vomiting)   . Post traumatic stress disorder (PTSD)   . SVD (spontaneous vaginal delivery) 12/2012   [facility]    Patient Active Problem List   Diagnosis Date Noted  . Allergic reactions 04/06/2015  . Chronic rhinitis 04/06/2015  . Elevated blood pressure reading without diagnosis of hypertension 04/06/2015  . 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 S/P hysterectomy 06/17/2014  . Pelvic pain in female 01/23/2014  . Contraception management 03/31/2013  . AKI (acute kidney injury) (HCC) 01/04/2013  .  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  . Hydronephrosis, right 12/30/2012  . Pleuritic chest pain 12/30/2012  . Pneumonia 12/30/2012  . Primary dysmenorrhea 03/07/2012    Past Surgical History:  Procedure Laterality Date  . CYSTOSCOPY WITH STENT PLACEMENT Right 12/25/2012   Procedure: CYSTOSCOPY WITH STENT PLACEMENT;  Surgeon: Marcine Matar, MD;  Location: [facility];  Service: Urology;  Laterality: Right;  . LAPAROSCOPIC ASSISTED VAGINAL HYSTERECTOMY N/A 06/17/2014   Procedure: LAPAROSCOPIC ASSISTED VAGINAL HYSTERECTOMY;  Surgeon: Lavina Hamman,  MD;  Location: [facility];  Service: Gynecology;  Laterality: N/A;  . LAPAROSCOPIC BILATERAL SALPINGECTOMY Bilateral 03/31/2013   Procedure: LAPAROSCOPIC BILATERAL SALPINGECTOMY;  Surgeon: Lavina Hamman, MD;  Location: [facility];  Service: Gynecology;  Laterality: Bilateral;  . LAPAROSCOPY N/A 01/23/2014   Procedure: LAPAROSCOPY DIAGNOSTIC RIGHT PERITONEAL BIOPSY;  Surgeon: Lavina Hamman, MD;  Location: [facility];  Service: Gynecology;  Laterality: N/A;  . nephrostomy tube placement at 25wks and removed at 335/7 Right   . OOPHORECTOMY Bilateral 06/17/2014   Procedure: OOPHORECTOMY;  Surgeon: Lavina Hamman, MD;  Location: [facility];  Service: Gynecology;  Laterality: Bilateral;  . TUBAL LIGATION    . VIDEO ASSISTED THORACOSCOPY (VATS)/DECORTICATION Right 01/08/2013   Procedure: VIDEO ASSISTED THORACOSCOPY DECORTICATION; Drainage of Pleural Effusion;  Surgeon: Loreli Slot, MD;  Location: [facility];  Service: Thoracic;  Laterality: Right;  . WISDOM TOOTH EXTRACTION  06/2013    OB History    Gravida Para Term Preterm AB Living   2 1 0 0 0 0   SAB TAB Ectopic Multiple Live Births   0 0 0 0         Home Medications    Prior to Admission medications   Medication Sig Start Date End Date Taking? Authorizing Provider  amphetamine-dextroamphetamine (ADDERALL) 20 MG tablet Take 20 mg by mouth daily. 03/26/15 04/15/16  Historical Provider, MD  EPINEPHrine 0.3 mg/0.3 mL IJ SOAJ injection Use as directed for life-threatening allergic reaction. 04/06/15   Cristal Ford, MD  fluticasone (FLONASE) 50 MCG/ACT nasal spray Place 2 sprays into both nostrils daily. 04/15/16   Rise Mu, PA-C  LORazepam (ATIVAN) 0.5 MG tablet Take 0.5 mg by mouth as needed. 04/06/15   Historical Provider, MD  ranitidine (ZANTAC) 150 MG tablet Take 150 mg by mouth daily.    Historical Provider, MD    Family History Family History  Problem Relation Age of Onset  . Heart disease Mother   . Hypertension Father   .  Cancer Paternal Grandmother     lung  . Heart disease Paternal Grandmother     Social History Social History  Substance Use Topics  . Smoking status: Former Smoker    Packs/day: 0.25    Years: 9.00    Types: Cigarettes    Quit date: 04/12/2016  . Smokeless tobacco: Never Used  . Alcohol use Yes     Allergies   Cinnamon; Shellfish allergy; Augmentin [amoxicillin-pot clavulanate]; Phenergan [promethazine hcl]; and Nickel   Review of Systems Review of Systems  Constitutional: Negative for chills and fever.  HENT: Negative for congestion.   Respiratory: Positive for cough. Negative for hemoptysis and sputum production.        Negative for hemoptysis.  Cardiovascular: Positive for chest pain. Negative for leg swelling.  Gastrointestinal: Negative for abdominal pain.  All other systems reviewed and are negative.    Physical Exam Updated Vital Signs BP 127/80 (BP Location: Right Arm)   Pulse 85   Temp 98.2 F (36.8 C) (Oral)  Resp 16   Ht 5\' 3"  (1.6 m)   Wt 170 lb (77.1 kg)   LMP 01/04/2014   SpO2 100%   BMI 30.11 kg/m   Physical Exam  Constitutional: She is oriented to person, place, and time. She appears well-developed and well-nourished. No distress.  HENT:  Head: Normocephalic and atraumatic.  Eyes: EOM are normal. Pupils are equal, round, and reactive to light.  Neck: Normal range of motion. Neck supple.  Cardiovascular: Normal rate and regular rhythm.  Exam reveals no gallop and no friction rub.   No murmur heard. Pulmonary/Chest: Effort normal. She has no wheezes. She has no rales. She exhibits tenderness.  Pain reproduced with palpation to the right chest wall around mid-axillary line and rib 5.  Abdominal: Soft. She exhibits no distension. There is no tenderness.  Musculoskeletal: She exhibits no edema or tenderness.  Neurological: She is alert and oriented to person, place, and time.  Skin: Skin is warm and dry. She is not diaphoretic.  Psychiatric:  She has a normal mood and affect. Her behavior is normal.  Nursing note and vitals reviewed.    ED Treatments / Results  Labs (all labs ordered are listed, but only abnormal results are displayed) Labs Reviewed - No data to display  EKG  EKG Interpretation None       Radiology Dg Chest 2 View  Result Date: 04/25/2016 CLINICAL DATA:  26 y/o F; chest pain and recent diagnosis of flu. History of VATS. EXAM: CHEST  2 VIEW COMPARISON:  03/06/2015 chest radiograph FINDINGS: Stable normal cardiac silhouette. Right costal diaphragmatic angle pleural thickening likely related to prior history of VATS is stable. No acute osseous abnormality. Clear lungs. No effusion or pneumothorax. IMPRESSION: No active cardiopulmonary disease. Electronically Signed   By: Mitzi Hansen M.D.   On: 04/25/2016 19:38    Procedures Procedures (including critical care time)  DIAGNOSTIC STUDIES: Oxygen Saturation is 100% on RA, normal by my interpretation.    COORDINATION OF CARE: 7:13 PM Discussed treatment plan with pt at bedside and pt agreed to plan.  Medications Ordered in ED Medications - No data to display   Initial Impression / Assessment and Plan / ED Course  I have reviewed the triage vital signs and the nursing notes.  Pertinent labs & imaging results that were available during my care of the patient were reviewed by me and considered in my medical decision making (see chart for details).  Clinical Course     26 yo F with a cc of chest pain.  Focal and reproducible.  ECG and cxr unremarkable.   10:54 PM:  I have discussed the diagnosis/risks/treatment options with the patient and family and believe the pt to be eligible for discharge home to follow-up with PCP. We also discussed returning to the ED immediately if new or worsening sx occur. We discussed the sx which are most concerning (e.g., sudden worsening pain, fever, inability to tolerate by mouth) that necessitate immediate  return. Medications administered to the patient during their visit and any new prescriptions provided to the patient are listed below.  Medications given during this visit Medications - No data to display   The patient appears reasonably screen and/or stabilized for discharge and I doubt any other medical condition or other [facility] requiring further screening, evaluation, or treatment in the ED at this time prior to discharge.    Final Clinical Impressions(s) / ED Diagnoses   Final diagnoses:  Chest wall pain    New  Prescriptions Discharge Medication List as of 04/25/2016  7:42 PM      I personally performed the services described in this documentation, which was scribed in my presence. The recorded information has been reviewed and is accurate.    Melene Plan, DO 04/25/16 2254

## 2016-04-25 NOTE — Discharge Instructions (Signed)
Take 4 over the counter ibuprofen tablets 3 times a day or 2 over-the-counter naproxen tablets twice a day for pain. Also take tylenol 1000mg(2 extra strength) four times a day.    

## 2016-04-25 NOTE — ED Notes (Signed)
Patient transported to X-ray 

## 2016-04-25 NOTE — ED Triage Notes (Addendum)
CP x 1.5 hour-NAD-steady gait-pt states dx with flu in Dec-denies fever, cough

## 2016-05-28 ENCOUNTER — Encounter (HOSPITAL_BASED_OUTPATIENT_CLINIC_OR_DEPARTMENT_OTHER): Payer: Self-pay | Admitting: *Deleted

## 2016-05-28 ENCOUNTER — Emergency Department (HOSPITAL_BASED_OUTPATIENT_CLINIC_OR_DEPARTMENT_OTHER)
Admission: EM | Admit: 2016-05-28 | Discharge: 2016-05-28 | Disposition: A | Discharge status: Home / Self Care | Payer: Commercial Managed Care - HMO | Attending: Emergency Medicine | Admitting: Emergency Medicine

## 2016-05-28 DIAGNOSIS — Z87891 Personal history of nicotine dependence: Secondary | ICD-10-CM | POA: Diagnosis not present

## 2016-05-28 DIAGNOSIS — Z79899 Other long term (current) drug therapy: Secondary | ICD-10-CM | POA: Diagnosis not present

## 2016-05-28 DIAGNOSIS — T7840XA Allergy, unspecified, initial encounter: Secondary | ICD-10-CM

## 2016-05-28 MED ORDER — DEXAMETHASONE SODIUM PHOSPHATE 10 MG/ML IJ SOLN
10.0000 mg | Freq: Once | INTRAMUSCULAR | Status: AC
  Administered 2016-05-28: 10 mg via INTRAMUSCULAR
  Filled 2016-05-28: qty 1

## 2016-05-28 MED ORDER — HYDROXYZINE HCL 25 MG PO TABS: 25.0000 mg | ORAL_TABLET | Freq: Four times a day (QID) | ORAL | 0 refills | Status: DC | PRN

## 2016-05-28 NOTE — ED Notes (Addendum)
Pt reports she is having an allergic reaction to some glitter she was using Friday night. Pt states she started having white spots all over body and itching so she drank benadryl. No redness, swelling, hives no shortness of breath, no white spots noted. Pt not scratching at this time.

## 2016-05-28 NOTE — ED Provider Notes (Signed)
[facility] Provider Note: Lowella Dell, MD, FACEP  CSN: [id number] MRN: [id number] ARRIVAL: 05/28/16 at 0241 ROOM: MH01/MH01   CHIEF COMPLAINT  Allergic Reaction   HISTORY OF PRESENT ILLNESS  [name] [name] is a 26 y.o. female who believes she had allergic reaction to a glittered skirt she made yesterday. It is the only lifestyle change that she can attribute her symptoms to. She had the gradual onset of generalized itching, burning and a rash. She had some chest tightness earlier that his improved. She describes the itching as severe. The rash has improved and is now present on the anterior chest. She took Benadryl for her symptoms the last dose about 1 AM. She did not get adequate relief from the Benadryl is requesting something stronger. She denies throat swelling, difficulty speaking, nausea, vomiting or diarrhea.   Past Medical History:  Diagnosis Date  . Anemia   . Anxiety    from OCD  . Arthritis    knee, ankle, left wrist  . Chronic pain    muscle spasms- entire body, pinch nerve in upper back  . Depression    doing fine now  . Fracture, ankle    left- x2 - resolved  . Headache(784.0)    otc med prn  . Heartburn    zantac prn  . History of blood transfusion 12/2012   at [facility] - 1 unit transfused  . Hydronephrosis    History  . Infection    UTI -Resolved - History only  . OCD (obsessive compulsive disorder)   . PONV (postoperative nausea and vomiting)   . Post traumatic stress disorder (PTSD)   . SVD (spontaneous vaginal delivery) 12/2012   x 1 at [facility]    Past Surgical History:  Procedure Laterality Date  . CYSTOSCOPY WITH STENT PLACEMENT Right 12/25/2012   Procedure: CYSTOSCOPY WITH STENT PLACEMENT;  Surgeon: Marcine Matar, MD;  Location: [facility];  Service: Urology;  Laterality: Right;  . LAPAROSCOPIC ASSISTED VAGINAL HYSTERECTOMY N/A 06/17/2014   Procedure: LAPAROSCOPIC ASSISTED VAGINAL HYSTERECTOMY;  Surgeon: Lavina Hamman, MD;   Location: [facility];  Service: Gynecology;  Laterality: N/A;  . LAPAROSCOPIC BILATERAL SALPINGECTOMY Bilateral 03/31/2013   Procedure: LAPAROSCOPIC BILATERAL SALPINGECTOMY;  Surgeon: Lavina Hamman, MD;  Location: [facility];  Service: Gynecology;  Laterality: Bilateral;  . LAPAROSCOPY N/A 01/23/2014   Procedure: LAPAROSCOPY DIAGNOSTIC RIGHT PERITONEAL BIOPSY;  Surgeon: Lavina Hamman, MD;  Location: [facility];  Service: Gynecology;  Laterality: N/A;  . nephrostomy tube placement at 25wks and removed at 335/7 Right   . OOPHORECTOMY Bilateral 06/17/2014   Procedure: OOPHORECTOMY;  Surgeon: Lavina Hamman, MD;  Location: [facility];  Service: Gynecology;  Laterality: Bilateral;  . TUBAL LIGATION    . VIDEO ASSISTED THORACOSCOPY (VATS)/DECORTICATION Right 01/08/2013   Procedure: VIDEO ASSISTED THORACOSCOPY DECORTICATION; Drainage of Pleural Effusion;  Surgeon: Loreli Slot, MD;  Location[facility] OR;  Service: Thoracic;  Laterality: Right;  . WISDOM TOOTH EXTRACTION  06/2013    Family History  Problem Relation Age of Onset  . Heart disease Mother   . Hypertension Father   . Cancer Paternal Grandmother     lung  . Heart disease Paternal Grandmother     Social History  Substance Use Topics  . Smoking status: Former Smoker    Packs/day: 0.25    Years: 9.00    Types: Cigarettes    Quit date: 04/12/2016  . Smokeless tobacco: Never Used  . Alcohol use Yes    Prior to  Admission medications   Medication Sig Start Date End Date Taking? Authorizing Provider  amphetamine-dextroamphetamine (ADDERALL) 20 MG tablet Take 20 mg by mouth daily. 03/26/15 04/15/16  Historical Provider, MD  EPINEPHrine 0.3 mg/0.3 mL IJ SOAJ injection Use as directed for life-threatening allergic reaction. 04/06/15   Cristal Fordalph Carter Bobbitt, MD  fluticasone (FLONASE) 50 MCG/ACT nasal spray Place 2 sprays into both nostrils daily. 04/15/16   Rise MuKenneth T Leaphart, PA-C  LORazepam (ATIVAN) 0.5 MG tablet Take 0.5 mg by mouth as needed.  04/06/15   Historical Provider, MD  ranitidine (ZANTAC) 150 MG tablet Take 150 mg by mouth daily.    Historical Provider, MD    Allergies Cinnamon; Shellfish allergy; Augmentin [amoxicillin-pot clavulanate]; Phenergan [promethazine hcl]; and Nickel   REVIEW OF SYSTEMS  Negative except as noted here or in the History of Present Illness.   PHYSICAL EXAMINATION  Initial Vital Signs Blood pressure 139/93, pulse 95, temperature 97.8 F (36.6 C), temperature source Oral, resp. rate 16, height 5\' 3"  (1.6 m), weight 171 lb (77.6 kg), last menstrual period 01/04/2014, SpO2 100 %.  Examination General: Well-developed, well-nourished female in no acute distress; appearance consistent with age of record HENT: normocephalic; atraumatic; no stridor; no dysphonia Eyes: pupils equal, round and reactive to light; extraocular muscles intact Neck: supple Heart: regular rate and rhythm Lungs: clear to auscultation bilaterally Abdomen: soft; nondistended; nontender; bowel sounds present Extremities: No deformity; full range of motion; pulses normal Neurologic: Awake, alert and oriented; motor function intact in all extremities and symmetric; no facial droop Skin: Warm and dry; faint, fine erythematous rash to chest Psychiatric: Normal mood and affect   RESULTS  Summary of this visit's results, reviewed by myself:   EKG Interpretation  Date/Time:    Ventricular Rate:    PR Interval:    QRS Duration:   QT Interval:    QTC Calculation:   R Axis:     Text Interpretation:        Laboratory Studies: No results found for this or any previous visit (from the past 24 hour(s)). Imaging Studies: No results found.  ED COURSE  Nursing notes and initial vitals signs, including pulse oximetry, reviewed.  Vitals:   05/28/16 0246 05/28/16 0254  BP: 139/93   Pulse: 95   Resp: 16   Temp: 97.8 F (36.6 C)   TempSrc: Oral   SpO2: 100%   Weight:  171 lb (77.6 kg)  Height:  5\' 3"  (1.6 m)     PROCEDURES    ED DIAGNOSES     ICD-9-CM ICD-10-CM   1. Allergic reaction, initial encounter 995.3 T78.40XA        Paula LibraJohn Clementine Soulliere, MD 05/28/16 [phone]

## 2016-05-28 NOTE — ED Triage Notes (Addendum)
Pt feels like she is allergic to a "tutu" that she made and work to work yesterday. States last dose of benadryl dose was 2 hours ago. Pt c/o feeling anterior chest tightness. Faint rash noted to anterior chest. States that she feels a little sob. Able to speak in complete sentences. No distress noted. resp even and unlabored. Lungs clear. Denies any new products, foods, or medicines. Pt has an epi pen at home but did not use.

## 2016-05-28 NOTE — ED Notes (Signed)
Pt discharged to home NAD.  

## 2016-11-07 ENCOUNTER — Emergency Department (HOSPITAL_BASED_OUTPATIENT_CLINIC_OR_DEPARTMENT_OTHER): Payer: Medicaid Other

## 2016-11-07 ENCOUNTER — Encounter (HOSPITAL_BASED_OUTPATIENT_CLINIC_OR_DEPARTMENT_OTHER): Payer: Self-pay

## 2016-11-07 ENCOUNTER — Emergency Department (HOSPITAL_BASED_OUTPATIENT_CLINIC_OR_DEPARTMENT_OTHER)
Admission: EM | Admit: 2016-11-07 | Discharge: 2016-11-07 | Disposition: A | Discharge status: Home / Self Care | Payer: Medicaid Other | Attending: Emergency Medicine | Admitting: Emergency Medicine

## 2016-11-07 DIAGNOSIS — G8929 Other chronic pain: Secondary | ICD-10-CM | POA: Insufficient documentation

## 2016-11-07 DIAGNOSIS — R109 Unspecified abdominal pain: Secondary | ICD-10-CM

## 2016-11-07 DIAGNOSIS — R110 Nausea: Secondary | ICD-10-CM | POA: Insufficient documentation

## 2016-11-07 DIAGNOSIS — L299 Pruritus, unspecified: Secondary | ICD-10-CM | POA: Insufficient documentation

## 2016-11-07 DIAGNOSIS — Z87891 Personal history of nicotine dependence: Secondary | ICD-10-CM | POA: Insufficient documentation

## 2016-11-07 DIAGNOSIS — R1031 Right lower quadrant pain: Secondary | ICD-10-CM | POA: Insufficient documentation

## 2016-11-07 DIAGNOSIS — Z79899 Other long term (current) drug therapy: Secondary | ICD-10-CM | POA: Insufficient documentation

## 2016-11-07 LAB — CBC WITH DIFFERENTIAL/PLATELET
Basophils Absolute: 0 10*3/uL (ref 0.0–0.1)
Basophils Relative: 0 %
Eosinophils Absolute: 0.2 10*3/uL (ref 0.0–0.7)
Eosinophils Relative: 3 %
HEMATOCRIT: 37.5 % (ref 36.0–46.0)
HEMOGLOBIN: 12.4 g/dL (ref 12.0–15.0)
LYMPHS ABS: 2.6 10*3/uL (ref 0.7–4.0)
LYMPHS PCT: 34 %
MCH: 26.4 pg (ref 26.0–34.0)
MCHC: 33.1 g/dL (ref 30.0–36.0)
MCV: 80 fL (ref 78.0–100.0)
MONO ABS: 0.5 10*3/uL (ref 0.1–1.0)
MONOS PCT: 6 %
NEUTROS ABS: 4.4 10*3/uL (ref 1.7–7.7)
NEUTROS PCT: 57 %
Platelets: 267 10*3/uL (ref 150–400)
RBC: 4.69 MIL/uL (ref 3.87–5.11)
RDW: 13.3 % (ref 11.5–15.5)
WBC: 7.8 10*3/uL (ref 4.0–10.5)

## 2016-11-07 LAB — COMPREHENSIVE METABOLIC PANEL
ALBUMIN: 4.5 g/dL (ref 3.5–5.0)
ALK PHOS: 91 U/L (ref 38–126)
ALT: 19 U/L (ref 14–54)
ANION GAP: 7 (ref 5–15)
AST: 18 U/L (ref 15–41)
BUN: 14 mg/dL (ref 6–20)
CALCIUM: 9.5 mg/dL (ref 8.9–10.3)
CHLORIDE: 107 mmol/L (ref 101–111)
CO2: 26 mmol/L (ref 22–32)
Creatinine, Ser: 0.77 mg/dL (ref 0.44–1.00)
GFR calc non Af Amer: >60 mL/min (ref >60)
GLUCOSE: 89 mg/dL (ref 65–99)
Potassium: 4 mmol/L (ref 3.5–5.1)
SODIUM: 140 mmol/L (ref 135–145)
Total Bilirubin: 0.6 mg/dL (ref 0.3–1.2)
Total Protein: 7.8 g/dL (ref 6.5–8.1)

## 2016-11-07 LAB — URINALYSIS, ROUTINE W REFLEX MICROSCOPIC
BILIRUBIN URINE: NEGATIVE
Glucose, UA: NEGATIVE mg/dL
HGB URINE DIPSTICK: NEGATIVE
Ketones, ur: NEGATIVE mg/dL
Leukocytes, UA: NEGATIVE
NITRITE: NEGATIVE
PROTEIN: NEGATIVE mg/dL
SPECIFIC GRAVITY, URINE: 1.005 (ref 1.005–1.030)
pH: 5.5 (ref 5.0–8.0)

## 2016-11-07 MED ORDER — HYDROXYZINE HCL 25 MG PO TABS: 25.0000 mg | ORAL_TABLET | Freq: Four times a day (QID) | ORAL | 0 refills | Status: DC | PRN

## 2016-11-07 MED ORDER — SODIUM CHLORIDE 0.9 % IV BOLUS (SEPSIS): 1000.0000 mL | Freq: Once | INTRAVENOUS | Status: DC

## 2016-11-07 MED FILL — hydrOXYzine HCL 25 MG TABS: 25 | 3 days supply | Qty: 12 | Fill #0

## 2016-11-07 NOTE — ED Triage Notes (Signed)
C/o bilat flank pain, itching x 2 weeks-[facility]"-left prior to d/c due to angry with clinician-NAD-steady gait

## 2016-11-07 NOTE — ED Provider Notes (Signed)
[facility]   CSN: [id number] Arrival date & time: 11/07/16  1327     History   Chief Complaint Chief Complaint  Patient presents with  . Flank Pain    HPI [name] [name] is a 26 y.o. female.  HPI   Pt with hx kidney stone, nephrostomy tube placement with chronic right kidney pain from known stone presents with new left "kidney pain" that feels exactly like the right side.  This developed several days ago.  Associated nausea yesterday and decreased urinary output.  Denies fevers, vomiting, bowel changes, bloody stool, abdominal pain, dysuria.    She also notes full body itching that occurs in different areas of her body at different times, initially only on her lower legs.  She denies any insect bites, environmental or chemical  Exposures, change in personal care products, medication changes.  She has not seen a rash.  No new tattoos.    Past Medical History:  Diagnosis Date  . Anemia   . Anxiety    from OCD  . Arthritis    knee, ankle, left wrist  . Chronic pain    muscle spasms- entire body, pinch nerve in upper back  . Depression    doing fine now  . Fracture, ankle    left- x2 - resolved  . Headache(784.0)    otc med prn  . Heartburn    zantac prn  . History of blood transfusion 12/2012   at [facility] - 1 unit transfused  . Hydronephrosis    History  . Infection    UTI -Resolved - History only  . OCD (obsessive compulsive disorder)   . PONV (postoperative nausea and vomiting)   . Post traumatic stress disorder (PTSD)   . SVD (spontaneous vaginal delivery) 12/2012   x 1 at [facility]    Patient Active Problem List   Diagnosis Date Noted  . Allergic reactions 04/06/2015  . Chronic rhinitis 04/06/2015  . Elevated blood pressure reading without diagnosis of hypertension 04/06/2015  . 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 S/P hysterectomy 06/17/2014  . Pelvic pain in female 01/23/2014  . Contraception management  03/31/2013  . AKI (acute kidney injury) (HCC) 01/04/2013  . 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  . Hydronephrosis, right 12/30/2012  . Pleuritic chest pain 12/30/2012  . Pneumonia 12/30/2012  . Primary dysmenorrhea 03/07/2012    Past Surgical History:  Procedure Laterality Date  . CYSTOSCOPY WITH STENT PLACEMENT Right 12/25/2012   Procedure: CYSTOSCOPY WITH STENT PLACEMENT;  Surgeon: Marcine Matar, MD;  Location: [facility];  Service: Urology;  Laterality: Right;  . LAPAROSCOPIC ASSISTED VAGINAL HYSTERECTOMY N/A 06/17/2014   Procedure: LAPAROSCOPIC ASSISTED VAGINAL HYSTERECTOMY;  Surgeon: Lavina Hamman, MD;  Location: [facility];  Service: Gynecology;  Laterality: N/A;  . LAPAROSCOPIC BILATERAL SALPINGECTOMY Bilateral 03/31/2013   Procedure: LAPAROSCOPIC BILATERAL SALPINGECTOMY;  Surgeon: Lavina Hamman, MD;  Location: [facility];  Service: Gynecology;  Laterality: Bilateral;  . LAPAROSCOPY N/A 01/23/2014   Procedure: LAPAROSCOPY DIAGNOSTIC RIGHT PERITONEAL BIOPSY;  Surgeon: Lavina Hamman, MD;  Location: [facility];  Service: Gynecology;  Laterality: N/A;  . nephrostomy tube placement at 25wks and removed at 335/7 Right   . OOPHORECTOMY Bilateral 06/17/2014   Procedure: OOPHORECTOMY;  Surgeon: Lavina Hamman, MD;  Location: [facility];  Service: Gynecology;  Laterality: Bilateral;  . TUBAL LIGATION    . VIDEO ASSISTED THORACOSCOPY (VATS)/DECORTICATION Right 01/08/2013   Procedure: VIDEO ASSISTED THORACOSCOPY DECORTICATION; Drainage of Pleural Effusion;  Surgeon: Salvatore Decent  Dorris Fetch, MD;  Location: [facility] OR;  Service: Thoracic;  Laterality: Right;  . WISDOM TOOTH EXTRACTION  06/2013    OB History    Gravida Para Term Preterm AB Living   2 1 0 0 0 0   SAB TAB Ectopic Multiple Live Births   0 0 0 0         Home Medications    Prior to Admission medications   Medication Sig Start Date End Date Taking? Authorizing Provider  amphetamine-dextroamphetamine (ADDERALL) 20 MG tablet Take 20 mg by mouth daily.  03/26/15 04/15/16  [provider]  EPINEPHrine 0.3 mg/0.3 mL IJ SOAJ injection Use as directed for life-threatening allergic reaction. 04/06/15   Bobbitt, Heywood Iles, MD  hydrOXYzine (ATARAX/VISTARIL) 25 MG tablet Take 1 tablet (25 mg total) by mouth every 6 (six) hours as needed for itching. 11/07/16   Trixie Dredge, PA-C  LORazepam (ATIVAN) 0.5 MG tablet Take 0.5 mg by mouth as needed. 04/06/15   [provider]  ranitidine (ZANTAC) 150 MG tablet Take 150 mg by mouth daily.    [provider]    Family History Family History  Problem Relation Age of Onset  . Heart disease Mother   . Hypertension Father   . Cancer Paternal Grandmother        lung  . Heart disease Paternal Grandmother     Social History Social History  Substance Use Topics  . Smoking status: Former Smoker    Packs/day: 0.25    Years: 9.00    Types: Cigarettes    Quit date: 04/12/2016  . Smokeless tobacco: Never Used  . Alcohol use No     Allergies   Cinnamon; Shellfish allergy; Augmentin [amoxicillin-pot clavulanate]; Phenergan [promethazine hcl]; and Nickel   Review of Systems Review of Systems  All other systems reviewed and are negative.    Physical Exam Updated Vital Signs BP 120/86 (BP Location: Right Arm)   Pulse 73   Temp 98.3 F (36.8 C) (Oral)   Resp 18   Wt 79.9 kg (176 lb 2 oz)   LMP 01/04/2014   SpO2 100%   BMI 31.20 kg/m   Physical Exam  Constitutional: She appears well-developed and well-nourished. No distress.  HENT:  Head: Normocephalic and atraumatic.  Neck: Neck supple.  Cardiovascular: Normal rate and regular rhythm.   Pulmonary/Chest: Effort normal and breath sounds normal. No respiratory distress. She has no wheezes. She has no rales.  Abdominal: Soft. She exhibits no distension and no mass. There is no tenderness. There is no rebound and no guarding.  Musculoskeletal: She exhibits no edema.  Neurological: She is alert.  Skin: No rash noted.  She is not diaphoretic.  Tattoos   Nursing note and vitals reviewed.    ED Treatments / Results  Labs (all labs ordered are listed, but only abnormal results are displayed) Labs Reviewed  URINALYSIS, ROUTINE W REFLEX MICROSCOPIC  COMPREHENSIVE METABOLIC PANEL  CBC WITH DIFFERENTIAL/PLATELET    EKG  EKG Interpretation None       Radiology US Renal  Result Date: 11/07/2016 CLINICAL DATA:  Bilateral flank pain, history of kidney stones. EXAM: RENAL / URINARY TRACT ULTRASOUND COMPLETE COMPARISON:  CT abdomen and pelvis dated March 04, 2016. FINDINGS: Right Kidney: Length: 10.1 cm. Echogenicity within normal limits. No mass or hydronephrosis visualized. Tiny 3 mm echogenic focus identified in the lower pole. Left Kidney: Length: 10.2 cm. Echogenicity within normal limits. No mass or hydronephrosis visualized. Bladder: Appears normal for degree of bladder  distention. IMPRESSION: 1. Tiny 3 mm echogenic focus in the lower pole of the right kidney may represent a small nonobstructive calculus. 2. Normal sonographic appearance of the left kidney. Electronically Signed   By: Obie DredgeWilliam T Derry M.D.   On: 11/07/2016 14:57    Procedures Procedures (including critical care time)  Medications Ordered in ED Medications - No data to display   Initial Impression / Assessment and Plan / ED Course  I have reviewed the triage vital signs and the nursing notes.  Pertinent labs & imaging results that were available during my care of the patient were reviewed by me and considered in my medical decision making (see chart for details).     Afebrile, nontoxic patient with new left thoracic back pain, not reproducible.  No abdominal pain.  Pt concerned she had new kidney stone.  US renal negative.  UA without infection.  Pt also with pruritis without rash.  Unknown etiology.  Labs are unremarkable.  No new tattoos that might expose pt to hepatitis - no jaundice.  LFTs normal.  Renal function normal.    D/C home with symptomatic treatment for itching, PCP follow up.  Discussed result, findings, treatment, and follow up  with patient.  Pt given return precautions.  Pt verbalizes understanding and agrees with plan.       Final Clinical Impressions(s) / ED Diagnoses   Final diagnoses:  Flank pain  Itching    New Prescriptions Discharge Medication List as of 11/07/2016  3:30 PM    START taking these medications   Details  hydrOXYzine (ATARAX/VISTARIL) 25 MG tablet Take 1 tablet (25 mg total) by mouth every 6 (six) hours as needed for itching., Starting Tue 11/07/2016, Print         Trixie DredgeWest, Garyson Stelly, PA-C 11/07/16 1622    Little, Ambrose Finlandachel Morgan, MD 11/08/16 [phone]

## 2016-11-07 NOTE — Discharge Instructions (Signed)
Read the information below.  Use the prescribed medication as directed.  Please discuss all new medications with your pharmacist.  You may return to the Emergency Department at any time for worsening condition or any new symptoms that concern you.    If you develop high fevers, worsening flank or new abdominal pain, uncontrolled vomiting, or are unable to tolerate fluids by mouth, return to the ER for a recheck.

## 2017-07-15 ENCOUNTER — Other Ambulatory Visit: Payer: Self-pay

## 2017-07-15 ENCOUNTER — Encounter (HOSPITAL_BASED_OUTPATIENT_CLINIC_OR_DEPARTMENT_OTHER): Payer: Self-pay | Admitting: Emergency Medicine

## 2017-07-15 DIAGNOSIS — N760 Acute vaginitis: Secondary | ICD-10-CM | POA: Diagnosis not present

## 2017-07-15 DIAGNOSIS — N3000 Acute cystitis without hematuria: Secondary | ICD-10-CM | POA: Diagnosis not present

## 2017-07-15 DIAGNOSIS — R300 Dysuria: Secondary | ICD-10-CM | POA: Diagnosis present

## 2017-07-15 DIAGNOSIS — Z79899 Other long term (current) drug therapy: Secondary | ICD-10-CM | POA: Diagnosis not present

## 2017-07-15 DIAGNOSIS — Z87891 Personal history of nicotine dependence: Secondary | ICD-10-CM | POA: Insufficient documentation

## 2017-07-15 NOTE — ED Triage Notes (Signed)
C/o dysuria, and vaginal pain states she took AZO at home without relief. Reports nausea and "abnormal discharge".

## 2017-07-16 ENCOUNTER — Emergency Department (HOSPITAL_BASED_OUTPATIENT_CLINIC_OR_DEPARTMENT_OTHER)
Admission: EM | Admit: 2017-07-16 | Discharge: 2017-07-16 | Disposition: A | Discharge status: Home / Self Care | Payer: Medicaid Other | Attending: Emergency Medicine | Admitting: Emergency Medicine

## 2017-07-16 DIAGNOSIS — N760 Acute vaginitis: Secondary | ICD-10-CM

## 2017-07-16 DIAGNOSIS — N3000 Acute cystitis without hematuria: Secondary | ICD-10-CM

## 2017-07-16 LAB — URINALYSIS, ROUTINE W REFLEX MICROSCOPIC
Glucose, UA: 100 mg/dL — AB
KETONES UR: 15 mg/dL — AB
Nitrite: POSITIVE — AB
PH: 5 (ref 5.0–8.0)
Protein, ur: 100 mg/dL — AB

## 2017-07-16 LAB — URINALYSIS, MICROSCOPIC (REFLEX)

## 2017-07-16 LAB — WET PREP, GENITAL
SPERM: NONE SEEN
TRICH WET PREP: NONE SEEN
Yeast Wet Prep HPF POC: NONE SEEN

## 2017-07-16 MED ORDER — NITROFURANTOIN MONOHYD MACRO 100 MG PO CAPS
100.0000 mg | ORAL_CAPSULE | Freq: Once | ORAL | Status: AC
  Administered 2017-07-16: 100 mg via ORAL
  Filled 2017-07-16: qty 1

## 2017-07-16 MED ORDER — FLUCONAZOLE 150 MG PO TABS: 150.0000 mg | ORAL_TABLET | Freq: Every day | ORAL | 0 refills | Status: DC

## 2017-07-16 MED ORDER — NITROFURANTOIN MONOHYD MACRO 100 MG PO CAPS: 100.0000 mg | ORAL_CAPSULE | Freq: Two times a day (BID) | ORAL | 0 refills | Status: DC

## 2017-07-16 MED ORDER — OXYCODONE-ACETAMINOPHEN 5-325 MG PO TABS: 1.0000 | ORAL_TABLET | ORAL | 0 refills | Status: DC | PRN

## 2017-07-16 MED ORDER — IBUPROFEN 800 MG PO TABS
800.0000 mg | ORAL_TABLET | Freq: Once | ORAL | Status: AC
Start: 2017-07-16 — End: 2017-07-16
  Administered 2017-07-16: 800 mg via ORAL
  Filled 2017-07-16: qty 1

## 2017-07-16 MED ORDER — METRONIDAZOLE 500 MG PO TABS: 500.0000 mg | ORAL_TABLET | Freq: Two times a day (BID) | ORAL | 0 refills | Status: DC

## 2017-07-16 NOTE — ED Provider Notes (Signed)
[facility] EMERGENCY DEPARTMENT Provider Note   CSN: [id number] Arrival date & time: 07/15/17  2339     History   Chief Complaint Chief Complaint  Patient presents with  . Dysuria    HPI [name] [name] is a 27 y.o. female.  The history is provided by the patient.  She has history of obsessive-compulsive disorder, posttraumatic stress disorder, total hysterectomy for endometriosis, and comes in complaining of dysuria for the last 4 days.  She states it feels like she is urinating razor blades.  Pain extends up into her clitoris.  She denies any vaginal discharge.  She denies any abdominal pain or flank pain.  She denies nausea or vomiting.  She denies fever or chills.  She does have urinary urgency, frequency, tenesmus.  She called ask a nurse and was told to come here because they were worried she had a severe yeast infection.  She has been taking phenazopyridine without relief.  Past Medical History:  Diagnosis Date  . Anemia   . Anxiety    from OCD  . Arthritis    knee, ankle, left wrist  . Chronic pain    muscle spasms- entire body, pinch nerve in upper back  . Depression    doing fine now  . Fracture, ankle    left- x2 - resolved  . Headache(784.0)    otc med prn  . Heartburn    zantac prn  . History of blood transfusion 12/2012   at [facility] - 1 unit transfused  . Hydronephrosis    History  . Infection    UTI -Resolved - History only  . OCD (obsessive compulsive disorder)   . PONV (postoperative nausea and vomiting)   . Post traumatic stress disorder (PTSD)   . SVD (spontaneous vaginal delivery) 12/2012   x 1 at [facility]    Patient Active Problem List   Diagnosis Date Noted  . Allergic reactions 04/06/2015  . Chronic rhinitis 04/06/2015  . Elevated blood pressure reading without diagnosis of hypertension 04/06/2015  . 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 S/P hysterectomy 06/17/2014  . Pelvic pain in female 01/23/2014  .  Contraception management 03/31/2013  . AKI (acute kidney injury) (HCC) 01/04/2013  . 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  . Hydronephrosis, right 12/30/2012  . Pleuritic chest pain 12/30/2012  . Pneumonia 12/30/2012  . Primary dysmenorrhea 03/07/2012    Past Surgical History:  Procedure Laterality Date  . CYSTOSCOPY WITH STENT PLACEMENT Right 12/25/2012   Procedure: CYSTOSCOPY WITH STENT PLACEMENT;  Surgeon: Marcine Matar, MD;  Location: [facility];  Service: Urology;  Laterality: Right;  . LAPAROSCOPIC ASSISTED VAGINAL HYSTERECTOMY N/A 06/17/2014   Procedure: LAPAROSCOPIC ASSISTED VAGINAL HYSTERECTOMY;  Surgeon: Lavina Hamman, MD;  Location: [facility];  Service: Gynecology;  Laterality: N/A;  . LAPAROSCOPIC BILATERAL SALPINGECTOMY Bilateral 03/31/2013   Procedure: LAPAROSCOPIC BILATERAL SALPINGECTOMY;  Surgeon: Lavina Hamman, MD;  Location: [facility];  Service: Gynecology;  Laterality: Bilateral;  . LAPAROSCOPY N/A 01/23/2014   Procedure: LAPAROSCOPY DIAGNOSTIC RIGHT PERITONEAL BIOPSY;  Surgeon: Lavina Hamman, MD;  Location: [facility];  Service: Gynecology;  Laterality: N/A;  . nephrostomy tube placement at 25wks and removed at 335/7 Right   . OOPHORECTOMY Bilateral 06/17/2014   Procedure: OOPHORECTOMY;  Surgeon: Lavina Hamman, MD;  Location: [facility];  Service: Gynecology;  Laterality: Bilateral;  . TUBAL LIGATION    . VIDEO ASSISTED THORACOSCOPY (VATS)/DECORTICATION Right 01/08/2013   Procedure: VIDEO ASSISTED THORACOSCOPY DECORTICATION; Drainage of Pleural Effusion;  Surgeon:  Loreli SlotSteven C Hendrickson, MD;  [facility];  Service: Thoracic;  Laterality: Right;  . WISDOM TOOTH EXTRACTION  06/2013     OB History    Gravida  2   Para  1   Term  0   Preterm  0   AB  0   Living  0     SAB  0   TAB  0   Ectopic  0   Multiple  0   Live Births               Home Medications    Prior to Admission medications   Medication Sig Start Date End Date Taking? Authorizing Provider    amphetamine-dextroamphetamine (ADDERALL) 20 MG tablet Take 20 mg by mouth daily. 03/26/15 04/15/16  [provider]  EPINEPHrine 0.3 mg/0.3 mL IJ SOAJ injection Use as directed for life-threatening allergic reaction. 04/06/15   Bobbitt, Heywood Ilesalph Carter, MD  hydrOXYzine (ATARAX/VISTARIL) 25 MG tablet Take 1 tablet (25 mg total) by mouth every 6 (six) hours as needed for itching. 11/07/16   Trixie DredgeWest, Emily, PA-C  LORazepam (ATIVAN) 0.5 MG tablet Take 0.5 mg by mouth as needed. 04/06/15   [provider]  ranitidine (ZANTAC) 150 MG tablet Take 150 mg by mouth daily.    [provider]    Family History Family History  Problem Relation Age of Onset  . Heart disease Mother   . Hypertension Father   . Cancer Paternal Grandmother        lung  . Heart disease Paternal Grandmother     Social History Social History   Tobacco Use  . Smoking status: Former Smoker    Packs/day: 0.25    Years: 9.00    Pack years: 2.25    Types: Cigarettes    Last attempt to quit: 04/12/2016    Years since quitting: 1.2  . Smokeless tobacco: Never Used  Substance Use Topics  . Alcohol use: No  . Drug use: No     Allergies   Cinnamon; Shellfish allergy; Augmentin [amoxicillin-pot clavulanate]; Phenergan [promethazine hcl]; and Nickel   Review of Systems Review of Systems  All other systems reviewed and are negative.    Physical Exam Updated Vital Signs BP (!) 148/103 (BP Location: Right Arm)   Pulse 99   Temp 98.9 F (37.2 C) (Oral)   Resp 20   Ht 5\' 2"  (1.575 m)   Wt 84.8 kg (187 lb)   LMP 01/04/2014   SpO2 98%   BMI 34.20 kg/m   Physical Exam  Nursing note and vitals reviewed.  27 year old female, resting comfortably and in no acute distress. Vital signs are significant for elevated blood pressure. Oxygen saturation is 98%, which is normal. Head is normocephalic and atraumatic. PERRLA, EOMI. Oropharynx is clear. Neck is nontender and supple without adenopathy or  JVD. Back is nontender and there is no CVA tenderness. Lungs are clear without rales, wheezes, or rhonchi. Chest is nontender. Heart has regular rate and rhythm without murmur. Abdomen is soft, flat, nontender without masses or hepatosplenomegaly and peristalsis is normoactive.  Pelvic: Normal external female genitalia.  No lesions to suggest herpes simplex.  Speculum exam is exquisitely painful.  Whitish lesions present suggestive of monilia.  Swabs are sent for wet prep as well as GC and chlamydia.  Cervix is surgically absent.  Bimanual exam is deferred. Extremities have no cyanosis or edema, full range of motion is present. Skin is warm and dry without rash.  Neurologic: Mental status is normal, cranial nerves are intact, there are no motor or sensory deficits.  ED Treatments / Results  Labs (all labs ordered are listed, but only abnormal results are displayed) Labs Reviewed  WET PREP, GENITAL - Abnormal; Notable for the following components:      Result Value   Clue Cells Wet Prep HPF POC PRESENT (*)    WBC, Wet Prep HPF POC MODERATE (*)    All other components within normal limits  URINALYSIS, ROUTINE W REFLEX MICROSCOPIC - Abnormal; Notable for the following components:   Color, Urine ORANGE (*)    APPearance HAZY (*)    Specific Gravity, Urine >1.030 (*)    Glucose, UA 100 (*)    Hgb urine dipstick TRACE (*)    Bilirubin Urine MODERATE (*)    Ketones, ur 15 (*)    Protein, ur 100 (*)    Nitrite POSITIVE (*)    Leukocytes, UA TRACE (*)    All other components within normal limits  URINALYSIS, MICROSCOPIC (REFLEX) - Abnormal; Notable for the following components:   Bacteria, UA MANY (*)    Squamous Epithelial / LPF 6-30 (*)    All other components within normal limits  RPR  HIV ANTIBODY (ROUTINE TESTING)  GC/CHLAMYDIA PROBE AMP ([place]) NOT AT [facility]   Procedures Procedures   Medications Ordered in ED Medications  nitrofurantoin (macrocrystal-monohydrate)  (MACROBID) capsule 100 mg (has no administration in time range)     Initial Impression / Assessment and Plan / ED Course  I have reviewed the triage vital signs and the nursing notes.  Pertinent lab results that were available during my care of the patient were reviewed by me and considered in my medical decision making (see chart for details).  Dysuria with multiple urinary symptoms strongly suggestive of acute cystitis.  Urinalysis does show many bacteria, but is a contaminated specimen with 6-30 squamous epithelial cells as well as 6-30 WBCs.  Patient is concerned that her symptoms are too severe to be from just simple cystitis.  Pelvic exam is done showing probable monilial vaginitis.  Wet prep is positive for clue cells yeast.  I doubt if bacterial vaginosis is causing her vaginal pain, and the discharge in the vaginal vault seems typical of yeast.  We will treat empirically even though none seen on wet prep.  She is discharged with prescriptions for nitrofurantoin, metronidazole, fluconazole and also a small number of oxycodone-acetaminophen tablets.  Referred to women's outpatient clinic for follow-up if she continues to have vaginal pain..  Final Clinical Impressions(s) / ED Diagnoses   Final diagnoses:  Acute cystitis without hematuria  Acute vaginitis    ED Discharge Orders        Ordered    metroNIDAZOLE (FLAGYL) 500 MG tablet  2 times daily     07/16/17 0440    nitrofurantoin, macrocrystal-monohydrate, (MACROBID) 100 MG capsule  2 times daily     07/16/17 0440    fluconazole (DIFLUCAN) 150 MG tablet  Daily     07/16/17 0440    oxyCODONE-acetaminophen (PERCOCET) 5-325 MG tablet  Every 4 hours PRN     07/16/17 0440       Dione Booze, MD 07/16/17 [phone]

## 2017-07-17 LAB — RPR: RPR Ser Ql: NONREACTIVE

## 2017-07-17 LAB — GC/CHLAMYDIA PROBE AMP (~~LOC~~) NOT AT ARMC
Chlamydia: NEGATIVE
NEISSERIA GONORRHEA: NEGATIVE

## 2017-07-17 LAB — HIV ANTIBODY (ROUTINE TESTING W REFLEX): HIV SCREEN 4TH GENERATION: NONREACTIVE

## 2017-07-17 NOTE — ED Notes (Signed)
07/17/2017, Pt. Called for STD results.  The lab test that have been resulted, have been reviewed with pt. And all questions answered.

## 2017-08-21 DIAGNOSIS — Z8619 Personal history of other infectious and parasitic diseases: Secondary | ICD-10-CM | POA: Insufficient documentation

## 2018-09-11 DIAGNOSIS — F411 Generalized anxiety disorder: Secondary | ICD-10-CM | POA: Insufficient documentation

## 2018-09-15 DIAGNOSIS — F330 Major depressive disorder, recurrent, mild: Secondary | ICD-10-CM | POA: Insufficient documentation

## 2019-01-24 ENCOUNTER — Other Ambulatory Visit: Payer: Self-pay | Admitting: Physician Assistant

## 2019-01-24 DIAGNOSIS — N644 Mastodynia: Secondary | ICD-10-CM

## 2019-02-04 ENCOUNTER — Other Ambulatory Visit: Payer: Self-pay | Admitting: Physician Assistant

## 2019-02-04 DIAGNOSIS — N644 Mastodynia: Secondary | ICD-10-CM

## 2019-02-07 ENCOUNTER — Ambulatory Visit
Admission: RE | Admit: 2019-02-07 | Discharge: 2019-02-07 | Disposition: A | Discharge status: Home / Self Care | Payer: Medicaid Other | Source: Ambulatory Visit | Attending: Physician Assistant | Admitting: Physician Assistant

## 2019-02-07 ENCOUNTER — Other Ambulatory Visit: Payer: Self-pay

## 2019-02-07 ENCOUNTER — Other Ambulatory Visit: Payer: Medicaid Other

## 2019-02-07 DIAGNOSIS — N644 Mastodynia: Secondary | ICD-10-CM

## 2019-05-28 ENCOUNTER — Ambulatory Visit: Payer: Medicaid Other | Attending: Internal Medicine

## 2019-05-28 DIAGNOSIS — Z20822 Contact with and (suspected) exposure to covid-19: Secondary | ICD-10-CM

## 2019-05-29 LAB — NOVEL CORONAVIRUS, NAA: SARS-CoV-2, NAA: NOT DETECTED

## 2019-06-26 ENCOUNTER — Ambulatory Visit (INDEPENDENT_AMBULATORY_CARE_PROVIDER_SITE_OTHER): Payer: Medicaid Other | Admitting: Plastic Surgery

## 2019-06-26 ENCOUNTER — Encounter: Payer: Self-pay | Admitting: Plastic Surgery

## 2019-06-26 ENCOUNTER — Other Ambulatory Visit: Payer: Self-pay

## 2019-06-26 VITALS — BP 138/91 | HR 90 | Temp 98.6°F | Ht 62.0 in | Wt 219.0 lb

## 2019-06-26 DIAGNOSIS — N62 Hypertrophy of breast: Secondary | ICD-10-CM

## 2019-06-26 NOTE — Progress Notes (Signed)
Referring Provider Tracey Harries, MD [address] [place],  [place] [address]   CC:  Chief Complaint  Patient presents with  . Advice Only    BL breast reduction      [name] [name] is an 29 y.o. female.  HPI: Patient presents to discuss breast reduction.  She is a years of back pain neck pain shoulder grooving and rashes beneath her breast.  She did have physical therapy years ago and had a steroid injection for back pain with little relief.  She is a 47 she wants to be a double D.  She quit smoking 2 years ago.  She does have an immediate family history of breast cancer including her mom her grandmother and her aunt who have all had breast cancer with mastectomies.  She is also had a mammogram which was benign.  She has tried anti-inflammatory medication for the pain with little relief.  Allergies  Allergen Reactions  . Cinnamon Anaphylaxis and Hives  . Shellfish Allergy Anaphylaxis  . Augmentin [Amoxicillin-Pot Clavulanate] Nausea And Vomiting and Rash  . Phenergan [Promethazine Hcl]     vomiting  . Nickel Rash    Outpatient Encounter Medications as of 06/26/2019  Medication Sig Note  . EPINEPHrine 0.3 mg/0.3 mL IJ SOAJ injection Use as directed for life-threatening allergic reaction.   . fluconazole (DIFLUCAN) 150 MG tablet Take 1 tablet (150 mg total) by mouth daily. Take one tablet when you get the prescription filled, and the second after completing the course of antibiotics   . hydrOXYzine (ATARAX/VISTARIL) 25 MG tablet Take 1 tablet (25 mg total) by mouth every 6 (six) hours as needed for itching.   Marland Kitchen LORazepam (ATIVAN) 0.5 MG tablet Take 0.5 mg by mouth as needed. 04/06/2015: Received from: [facility] Received Sig: Take 0.5 mg by mouth.  . metroNIDAZOLE (FLAGYL) 500 MG tablet Take 1 tablet (500 mg total) by mouth 2 (two) times daily.   . nitrofurantoin, macrocrystal-monohydrate, (MACROBID) 100 MG capsule Take 1 capsule (100 mg total) by mouth 2 (two)  times daily.   Marland Kitchen oxyCODONE-acetaminophen (PERCOCET) 5-325 MG tablet Take 1 tablet by mouth every 4 (four) hours as needed for moderate pain.   . ranitidine (ZANTAC) 150 MG tablet Take 150 mg by mouth daily. 04/06/2015: Received from: [facility] Received Sig: Take 150 mg by mouth.  Marland Kitchen amphetamine-dextroamphetamine (ADDERALL) 20 MG tablet Take 20 mg by mouth daily.  04/06/2015: Received from: [facility] Received Sig: Take 20 mg by mouth.   No facility-administered encounter medications on file as of 06/26/2019.     Past Medical History:  Diagnosis Date  . Anemia   . Anxiety    from OCD  . Arthritis    knee, ankle, left wrist  . Chronic pain    muscle spasms- entire body, pinch nerve in upper back  . Depression    doing fine now  . Fracture, ankle    left- x2 - resolved  . Headache(784.0)    otc med prn  . Heartburn    zantac prn  . History of blood transfusion 12/2012   at [facility] - 1 unit transfused  . Hydronephrosis    History  . Infection    UTI -Resolved - History only  . OCD (obsessive compulsive disorder)   . PONV (postoperative nausea and vomiting)   . Post traumatic stress disorder (PTSD)   . SVD (spontaneous vaginal delivery) 12/2012   x 1 at [facility]  Past Surgical History:  Procedure Laterality Date  . CYSTOSCOPY WITH STENT PLACEMENT Right 12/25/2012   Procedure: CYSTOSCOPY WITH STENT PLACEMENT;  Surgeon: Franchot Gallo, MD;  Location: [facility];  Service: Urology;  Laterality: Right;  . LAPAROSCOPIC ASSISTED VAGINAL HYSTERECTOMY N/A 06/17/2014   Procedure: LAPAROSCOPIC ASSISTED VAGINAL HYSTERECTOMY;  Surgeon: Cheri Fowler, MD;  Location: [place] [facility];  Service: Gynecology;  Laterality: N/A;  . LAPAROSCOPIC BILATERAL SALPINGECTOMY Bilateral 03/31/2013   Procedure: LAPAROSCOPIC BILATERAL SALPINGECTOMY;  Surgeon: Cheri Fowler, MD;  Location: [place] [facility];  Service: Gynecology;  Laterality: Bilateral;  . LAPAROSCOPY N/A 01/23/2014   Procedure: LAPAROSCOPY DIAGNOSTIC  RIGHT PERITONEAL BIOPSY;  Surgeon: Cheri Fowler, MD;  Location: [place] [facility];  Service: Gynecology;  Laterality: N/A;  . nephrostomy tube placement at 25wks and removed at 335/7 Right   . OOPHORECTOMY Bilateral 06/17/2014   Procedure: OOPHORECTOMY;  Surgeon: Cheri Fowler, MD;  Location: [place] [facility];  Service: Gynecology;  Laterality: Bilateral;  . TUBAL LIGATION    . VIDEO ASSISTED THORACOSCOPY (VATS)/DECORTICATION Right 01/08/2013   Procedure: VIDEO ASSISTED THORACOSCOPY DECORTICATION; Drainage of Pleural Effusion;  Surgeon: Melrose Nakayama, MD;  Location: [place];  Service: Thoracic;  Laterality: Right;  . WISDOM TOOTH EXTRACTION  06/2013    Family History  Problem Relation Age of Onset  . Heart disease Mother   . Breast cancer Mother   . Hypertension Father   . Cancer Paternal Grandmother        lung  . Heart disease Paternal Grandmother   . Breast cancer Maternal Aunt   . Breast cancer Maternal Grandmother     Social History   Social History Narrative  . Not on file     Review of Systems General: Denies fevers, chills, weight loss CV: Denies chest pain, shortness of breath, palpitations  Physical Exam Vitals with BMI 06/26/2019 07/16/2017 07/15/2017  Height 5\' 2"  - 5\' 2"   Weight 219 lbs - 187 lbs  BMI 98.33 - 82.50  Systolic 539 767 341  Diastolic 91 937 902  Pulse 90 106 99  Some encounter information is confidential and restricted. Go to Review Flowsheets activity to see all data.    General:  No acute distress,  Alert and oriented, Non-Toxic, Normal speech and affect Breast: She has grade 3 ptosis.  Sternal notch to nipple is 36 cm bilaterally.  Nipple to fold is 19 cm on the right and 18 cm on the left.  I do not see any obvious scars or masses.  Assessment/Plan The patient has bilateral symptomatic macromastia.  She is a good candidate for a breast reduction.  The details of breast reduction surgery were discussed.  I explained the procedure in detail along the with the  expected scars.  The risks were discussed in detail and include bleeding, infection, damage to surrounding structures, need for additional procedures, nipple loss, change in nipple sensation, persistent pain, contour irregularities and asymmetries.  I explained that breast feeding is often not possible after breast reduction surgery.  We discussed the expected postoperative course with an overall recovery period of about 1 month.  She demonstrated full understanding of all risks.  We discussed her personal risk factors that include her BMI putting her in a higher risk category for wound healing complications.  I anticipate approximately 800 g of tissue removed from each side.  I did explain that Medicaid in particular has been denying most of the breast reduction that we submitted to them.  I do feel that because of her symptoms and  particularly her close family history of breast cancer that she would benefit from this procedure, as breast reduction should reduce her risk of cancer in the future.  [name] [name] 06/26/2019, 2:26 PM

## 2019-07-10 ENCOUNTER — Ambulatory Visit: Payer: Medicaid Other | Attending: Internal Medicine

## 2019-07-10 DIAGNOSIS — Z23 Encounter for immunization: Secondary | ICD-10-CM

## 2019-07-10 NOTE — Progress Notes (Signed)
   Covid-19 Vaccination Clinic  Name:  [name] [name]    MRN: [id number] DOB: 1991/02/28  07/10/2019  [name] was observed post Covid-19 immunization for 30 minutes based on pre-vaccination screening without incident. She was provided with Vaccine Information Sheet and instruction to access the V-Safe system.   [name] was instructed to call 911 with any severe reactions post vaccine: Marland Kitchen Difficulty breathing  . Swelling of face and throat  . A fast heartbeat  . A bad rash all over body  . Dizziness and weakness   Immunizations Administered    Name Date Dose VIS Date Route   Pfizer COVID-19 Vaccine 07/10/2019  1:38 PM 0.3 mL 03/28/2019 Intramuscular   Manufacturer: [employer], [employer]   Lot: [id number]   NDC: [address]-8

## 2019-08-04 ENCOUNTER — Ambulatory Visit: Payer: Medicaid Other | Attending: Internal Medicine

## 2019-08-04 DIAGNOSIS — Z23 Encounter for immunization: Secondary | ICD-10-CM

## 2019-08-04 NOTE — Progress Notes (Signed)
   Covid-19 Vaccination Clinic  Name:  [name] [name]    MRN: [id number] DOB: 1991-03-19  08/04/2019  Ms. Bruinsma was observed post Covid-19 immunization for 30 minutes based on pre-vaccination screening without incident. She was provided with Vaccine Information Sheet and instruction to access the V-Safe system.   Ms. Beahm was instructed to call 911 with any severe reactions post vaccine: Marland Kitchen Difficulty breathing  . Swelling of face and throat  . A fast heartbeat  . A bad rash all over body  . Dizziness and weakness   Immunizations Administered    Name Date Dose VIS Date Route   Pfizer COVID-19 Vaccine 08/04/2019 11:51 AM 0.3 mL 06/11/2018 Intramuscular   Manufacturer: [employer], [employer]   Lot: [id number]   NDC: [address]-3

## 2019-09-09 DIAGNOSIS — E559 Vitamin D deficiency, unspecified: Secondary | ICD-10-CM | POA: Insufficient documentation

## 2019-10-02 ENCOUNTER — Ambulatory Visit: Payer: Medicaid Other | Admitting: Plastic Surgery

## 2019-11-03 DIAGNOSIS — M2550 Pain in unspecified joint: Secondary | ICD-10-CM | POA: Insufficient documentation

## 2019-11-03 DIAGNOSIS — E782 Mixed hyperlipidemia: Secondary | ICD-10-CM | POA: Insufficient documentation

## 2020-03-30 ENCOUNTER — Other Ambulatory Visit: Payer: Self-pay

## 2020-03-30 ENCOUNTER — Ambulatory Visit: Payer: Medicaid Other | Admitting: Internal Medicine

## 2020-03-30 ENCOUNTER — Encounter: Payer: Self-pay | Admitting: Internal Medicine

## 2020-03-30 VITALS — BP 129/90 | HR 71 | Ht 63.0 in | Wt 219.4 lb

## 2020-03-30 DIAGNOSIS — R700 Elevated erythrocyte sedimentation rate: Secondary | ICD-10-CM | POA: Diagnosis not present

## 2020-03-30 DIAGNOSIS — M2550 Pain in unspecified joint: Secondary | ICD-10-CM | POA: Diagnosis not present

## 2020-03-30 NOTE — Progress Notes (Signed)
Office Visit Note  Patient: [name] [name]             Date of Birth: June 11, 1990           MRN: [id number]             PCP: Juanda Chance Referring: Juanda Chance Visit Date: 03/30/2020 Occupation: [employer]  Subjective:  New Patient (Initial Visit), Joint Pain (Pain all over, more so lower body and right small finger), and Muscle Pain (Pain all over)   History of Present Illness: [name] [name] is a 29 y.o. female here for evaluation of elevated sedimentation rate and arthralgias. She has had joint pains for many years but this has become worse in previous months and also has concern on account of family history with RA and SLE. She has been started on Cymbalta previously for fibromyalgia and possible osteoarthritis pain without very great relief. Workup showed negative antibody serology but she has had elevated sedimentation rate of 54 without obvious cause. Symptoms vary in severity and she does experience exertion intolerance with several days of decreased functional ability after overdoing it. She was recently treated with a short course of prednisone that improved symptoms which is usual for her to see large benefit on steroids. 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  Activities of Daily Living:  Patient reports morning stiffness for 24 hours.   Patient Reports nocturnal pain.  Difficulty dressing/grooming: Reports Difficulty climbing stairs: Reports Difficulty getting out of chair: Denies Difficulty using hands for taps, buttons, cutlery, and/or writing: Reports  Review of Systems  Constitutional: Positive for fatigue.  HENT: Positive for mouth dryness. Negative for mouth sores and nose dryness.   Eyes: Negative for pain, itching, visual disturbance and dryness.  Respiratory: Negative for cough, hemoptysis,  shortness of breath and difficulty breathing.   Cardiovascular: Positive for chest pain and palpitations. Negative for swelling in legs/feet.  Gastrointestinal: Negative for abdominal pain, blood in stool, constipation and diarrhea.  Endocrine: Negative for increased urination.  Genitourinary: Negative for painful urination.  Musculoskeletal: Positive for arthralgias, joint pain, joint swelling, myalgias, muscle weakness, morning stiffness, muscle tenderness and myalgias.  Skin: Positive for rash and redness. Negative for color change.  Allergic/Immunologic: Positive for susceptible to infections.  Neurological: Positive for headaches, memory loss and weakness. Negative for dizziness and numbness.  Hematological: Negative for swollen glands.  Psychiatric/Behavioral: Positive for confusion and sleep disturbance.    PMFS History:  Patient Active Problem List   Diagnosis Date Noted  . Elevated sedimentation rate 03/30/2020  . Mixed hyperlipidemia 11/03/2019  . Polyarthralgia 11/03/2019  . Vitamin D deficiency 09/09/2019  . Mild episode of recurrent major depressive disorder ([place]) 09/15/2018  . GAD (generalized anxiety disorder) 09/11/2018  . 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 Allergic reactions 04/06/2015  . Chronic rhinitis 04/06/2015  . Elevated blood pressure reading without diagnosis of hypertension 04/06/2015  . 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 Hot flashes due to surgical menopause 01/26/2015  . S/P hysterectomy 06/17/2014  . Pelvic pain in female 01/23/2014  . Contraception management 03/31/2013  . AKI (acute kidney injury) ([place]) 01/04/2013  . 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[place]) 12/30/2012  . Hydronephrosis, right 12/30/2012  . Pleuritic chest pain 12/30/2012  . Pneumonia 12/30/2012  . Primary dysmenorrhea 03/07/2012    Past Medical History:  Diagnosis Date  . Anemia   . Anxiety    from OCD  . Arthritis    knee, ankle, left wrist  . Chronic pain     muscle spasms- entire body, pinch nerve in upper back  . Depression    doing fine now  . Fibromyalgia    Per patient  . Fracture, ankle    left- x2 - resolved  . Headache(784.0)    otc med prn  . Heartburn    zantac prn  . History of blood transfusion 12/2012   at [facility] - 1 unit transfused  . Hydronephrosis    History  . Infection    UTI -Resolved - History only  . OCD (obsessive compulsive disorder)   . Polyarthralgia    Per patient  . PONV (postoperative nausea and vomiting)   . Post traumatic stress disorder (PTSD)   . SVD (spontaneous vaginal delivery) 12/2012   x 1 at [facility]  . Tachycardia    Per patient    Family History  Problem Relation Age of Onset  . Heart disease Mother   . Breast cancer Mother   . Lupus Mother   . Sjogren's syndrome Mother   . Hypertension Father   . Cancer Paternal Grandmother        lung  . Heart disease Paternal Grandmother   . Breast cancer Maternal Aunt   . Breast cancer Maternal Grandmother   . Raynaud syndrome Sister   . Lupus Sister   . Raynaud syndrome Sister    Past Surgical History:  Procedure Laterality Date  . CYSTOSCOPY WITH STENT PLACEMENT Right 12/25/2012   Procedure: CYSTOSCOPY WITH STENT PLACEMENT;  Surgeon: Franchot Gallo, MD;  Location: [facility];  Service: Urology;  Laterality: Right;  . LAPAROSCOPIC ASSISTED VAGINAL HYSTERECTOMY N/A 06/17/2014   Procedure: LAPAROSCOPIC ASSISTED VAGINAL HYSTERECTOMY;  Surgeon: Cheri Fowler, MD;  Location: [facility];  Service: Gynecology;  Laterality: N/A;  . LAPAROSCOPIC BILATERAL SALPINGECTOMY Bilateral 03/31/2013   Procedure: LAPAROSCOPIC BILATERAL SALPINGECTOMY;  Surgeon: Cheri Fowler, MD;  Location: [facility];  Service: Gynecology;  Laterality: Bilateral;  . LAPAROSCOPY N/A 01/23/2014   Procedure: LAPAROSCOPY DIAGNOSTIC RIGHT PERITONEAL BIOPSY;  Surgeon: Cheri Fowler, MD;  Location: [facility];  Service: Gynecology;  Laterality: N/A;  . nephrostomy tube placement at 25wks and  removed at 335/7 Right   . OOPHORECTOMY Bilateral 06/17/2014   Procedure: OOPHORECTOMY;  Surgeon: Cheri Fowler, MD;  Location: [facility];  Service: Gynecology;  Laterality: Bilateral;  . TUBAL LIGATION    . VIDEO ASSISTED THORACOSCOPY (VATS)/DECORTICATION Right 01/08/2013   Procedure: VIDEO ASSISTED THORACOSCOPY DECORTICATION; Drainage of Pleural Effusion;  Surgeon: Melrose Nakayama, MD;  Location: [place];  Service: Thoracic;  Laterality: Right;  . WISDOM TOOTH EXTRACTION  06/2013   Social History   Social History Narrative  . Not on file   Immunization History  Administered Date(s) Administered  . PFIZER SARS-COV-2 Vaccination 07/10/2019, 08/04/2019     Objective: Vital Signs: BP 129/90 (BP Location: Right Arm, Patient Position: Sitting, Cuff Size: Normal)   Pulse 71   Ht 5' 3"  (1.6 m)   Wt 219 lb 6.4 oz (99.5 kg)   LMP 01/04/2014   BMI 38.86 kg/m    Physical Exam HENT:     Right Ear: External ear normal.     Left Ear: External ear normal.     Mouth/Throat:     Mouth: Mucous membranes are moist.     Pharynx: Oropharynx is clear.  Eyes:  Conjunctiva/sclera: Conjunctivae normal.  Cardiovascular:     Rate and Rhythm: Normal rate and regular rhythm.  Pulmonary:     Effort: Pulmonary effort is normal.     Breath sounds: Normal breath sounds.  Skin:    General: Skin is warm and dry.  Neurological:     General: No focal deficit present.     Mental Status: She is alert.  Psychiatric:        Mood and Affect: Mood normal.     Musculoskeletal Exam:  Neck full range of motion no tenderness Shoulder, elbow, wrist, fingers full range of motion no swelling Normal hip internal and external rotation without pain Knees, ankles, MTPs full range of motion no swelling    Investigation: No additional findings.  Imaging: No results found.  Recent Labs: Lab Results  Component Value Date   WBC 7.8 11/07/2016   HGB 12.4 11/07/2016   PLT 267 11/07/2016   NA 140 11/07/2016    K 4.0 11/07/2016   CL 107 11/07/2016   CO2 26 11/07/2016   GLUCOSE 89 11/07/2016   BUN 14 11/07/2016   CREATININE 0.77 11/07/2016   BILITOT 0.6 11/07/2016   ALKPHOS 91 11/07/2016   AST 18 11/07/2016   ALT 19 11/07/2016   PROT 7.8 11/07/2016   ALBUMIN 4.5 11/07/2016   CALCIUM 9.5 11/07/2016   GFRAA >60 11/07/2016    Speciality Comments: No specialty comments available.  Procedures:  No procedures performed Allergies: Cinnamon, Shellfish allergy, Augmentin [amoxicillin-pot clavulanate], Phenergan [promethazine hcl], and Nickel   Assessment / Plan:     Visit Diagnoses: Polyarthralgia  Elevated sedimentation rate - Plan: Cyclic citrul peptide antibody, IgG, Gliadin antibodies, serum, Tissue transglutaminase, IgA, IgG, IgA, IgM  No inflammatory changes are seen on exam today. Could be arthralgia more than arthritis or could be decreased from recent steroid treatment. Negative serologies so far will check for alternative causes of high ESR and arthralgias CCP, gliadin, TtG IgA, or immunoglobulins. If all negative may benefit from follow up whenever symptoms flare up again off treatment. Also does manifest presentation of FMS with the diffuse pains and worsening after excessive exertion.  Orders: Orders Placed This Encounter  Procedures  . Cyclic citrul peptide antibody, IgG  . Gliadin antibodies, serum  . Tissue transglutaminase, IgA  . IgG, IgA, IgM   No orders of the defined types were placed in this encounter.   Follow-Up Instructions: No follow-ups on file.   Collier Salina, MD  Note - This record has been created using [employer].  Chart creation errors have been sought, but may not always  have been located. Such creation errors do not reflect on  the standard of medical care.

## 2020-03-30 NOTE — Patient Instructions (Addendum)
I recommend checking out the [facility] patient centered care web page:  [email or web address]   I am checking several lab markers that would be associated with chronic joint and muscle pain with elevated inflammatory markers. If these are unremarkable the next step would be to set up return visit to check more acute reactive tests after an interruption in steroid use and will call after reviewing results to coordinate.

## 2020-03-31 LAB — GLIADIN ANTIBODIES, SERUM
Gliadin IgA: <1 U/mL
Gliadin IgG: <1 U/mL

## 2020-03-31 LAB — TISSUE TRANSGLUTAMINASE, IGA: (tTG) Ab, IgA: <1 U/mL

## 2020-03-31 LAB — IGG, IGA, IGM
IgG (Immunoglobin G), Serum: 800 mg/dL (ref 600–1640)
IgM, Serum: 140 mg/dL (ref 50–300)
Immunoglobulin A: 106 mg/dL (ref 47–310)

## 2020-03-31 LAB — CYCLIC CITRUL PEPTIDE ANTIBODY, IGG: Cyclic Citrullin Peptide Ab: <16 UNITS

## 2020-04-07 NOTE — Progress Notes (Signed)
Lab tests for rheumatoid arthritis, celiac disease, and some types of immunodeficiency are negative. I do not have any specific additional tests recommended at this time but as we discussed in clinic could benefit with follow up PRN if her symptoms become worse again to check at that time.

## 2020-04-19 NOTE — Progress Notes (Signed)
I believe the usefulness of checking markers of inflammation would be best when she is having more symptoms again and I expect this to occur since she is not on the prednisone long term. She can keep the previously scheduled appointment or reschedule as needed and we can check labs at the visit.

## 2020-04-25 NOTE — Progress Notes (Signed)
Office Visit Note  Patient: [name] [name]             Date of Birth: 08/27/1990           MRN: [id number]             PCP: Juanda Chance Referring: Mindi Curling, PA-C Visit Date: 04/26/2020   Subjective:  Follow-up (Per patient she had a rebound flare soon after discontinuing the Prednisone, within 3 days or so, skin issues - rash and redness on the face. )   History of Present Illness: [name] [name] is a 30 y.o. female here for follow up of joint pains and elevated sedimentation rate, after discontinuing prednisone around last visit. Lab testing at that time was unremarkable for autoantibodies. Follow up was recommended after discontinuing prednisone for any increase in symptoms. Since then she describes increase in skin rash especially on the face and also arthralgias. She feels these symptoms are fairly typical for her previous flares or episodes.     Review of Systems  Constitutional: Positive for fatigue.  HENT: Positive for mouth sores, mouth dryness and nose dryness.   Eyes: Positive for pain, itching, visual disturbance and dryness.  Respiratory: Negative for cough, hemoptysis, shortness of breath and difficulty breathing.   Cardiovascular: Negative for chest pain, palpitations and swelling in legs/feet.  Gastrointestinal: Negative for abdominal pain, blood in stool, constipation and diarrhea.  Endocrine: Negative for increased urination.  Genitourinary: Negative for painful urination.  Musculoskeletal: Positive for arthralgias, joint pain, myalgias, muscle weakness, morning stiffness, muscle tenderness and myalgias. Negative for joint swelling.  Skin: Positive for color change, rash and redness.  Allergic/Immunologic: Negative for susceptible to infections.  Neurological: Positive for headaches, memory loss and weakness. Negative for dizziness and numbness.  Hematological: Negative for swollen glands.  Psychiatric/Behavioral: Positive for confusion and sleep  disturbance.    PMFS History:  Patient Active Problem List   Diagnosis Date Noted  . Facial rash 04/26/2020  . Elevated sedimentation rate 03/30/2020  . Mixed hyperlipidemia 11/03/2019  . Polyarthralgia 11/03/2019  . Vitamin D deficiency 09/09/2019  . Mild episode of recurrent major depressive disorder ([place]) 09/15/2018  . GAD (generalized anxiety disorder) 09/11/2018  . 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 Allergic reactions 04/06/2015  . Chronic rhinitis 04/06/2015  . Elevated blood pressure reading without diagnosis of hypertension 04/06/2015  . 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 Hot flashes due to surgical menopause 01/26/2015  . S/P hysterectomy 06/17/2014  . Pelvic pain in female 01/23/2014  . Contraception management 03/31/2013  . AKI (acute kidney injury) ([place]) 01/04/2013  . 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[place]) 12/30/2012  . Hydronephrosis, right 12/30/2012  . Pleuritic chest pain 12/30/2012  . Pneumonia 12/30/2012  . Primary dysmenorrhea 03/07/2012    Past Medical History:  Diagnosis Date  . Anemia   . Anxiety    from OCD  . Arthritis    knee, ankle, left wrist  . Chronic pain    muscle spasms- entire body, pinch nerve in upper back  . Depression    doing fine now  . Fibromyalgia    Per patient  . Fracture, ankle    left- x2 - resolved  . Headache(784.0)    otc med prn  . Heartburn    zantac prn  . History of blood transfusion 12/2012   at [facility] - 1 unit transfused  . Hydronephrosis    History  . Infection  UTI -Resolved - History only  . OCD (obsessive compulsive disorder)   . Polyarthralgia    Per patient  . PONV (postoperative nausea and vomiting)   . Post traumatic stress disorder (PTSD)   . SVD (spontaneous vaginal delivery) 12/2012   x 1 at [facility]  . Tachycardia    Per patient    Family History  Problem Relation Age of Onset  . Heart disease Mother   . Breast cancer Mother   . Lupus Mother   . Sjogren's syndrome  Mother   . Hypertension Father   . Cancer Paternal Grandmother        lung  . Heart disease Paternal Grandmother   . Breast cancer Maternal Aunt   . Breast cancer Maternal Grandmother   . Raynaud syndrome Sister   . Lupus Sister   . Raynaud syndrome Sister    Past Surgical History:  Procedure Laterality Date  . CYSTOSCOPY WITH STENT PLACEMENT Right 12/25/2012   Procedure: CYSTOSCOPY WITH STENT PLACEMENT;  Surgeon: Franchot Gallo, MD;  [facility]: [facility];  Service: Urology;  Laterality: Right;  . LAPAROSCOPIC ASSISTED VAGINAL HYSTERECTOMY N/A 06/17/2014   Procedure: LAPAROSCOPIC ASSISTED VAGINAL HYSTERECTOMY;  Surgeon: Cheri Fowler, MD;  Location: [facility];  Service: Gynecology;  Laterality: N/A;  . LAPAROSCOPIC BILATERAL SALPINGECTOMY Bilateral 03/31/2013   Procedure: LAPAROSCOPIC BILATERAL SALPINGECTOMY;  Surgeon: Cheri Fowler, MD;  Location: [facility];  Service: Gynecology;  Laterality: Bilateral;  . LAPAROSCOPY N/A 01/23/2014   Procedure: LAPAROSCOPY DIAGNOSTIC RIGHT PERITONEAL BIOPSY;  Surgeon: Cheri Fowler, MD;  Location: [facility];  Service: Gynecology;  Laterality: N/A;  . nephrostomy tube placement at 25wks and removed at 335/7 Right   . OOPHORECTOMY Bilateral 06/17/2014   Procedure: OOPHORECTOMY;  Surgeon: Cheri Fowler, MD;  Location: [facility];  Service: Gynecology;  Laterality: Bilateral;  . TUBAL LIGATION    . VIDEO ASSISTED THORACOSCOPY (VATS)/DECORTICATION Right 01/08/2013   Procedure: VIDEO ASSISTED THORACOSCOPY DECORTICATION; Drainage of Pleural Effusion;  Surgeon: Melrose Nakayama, MD;  Location: [place];  Service: Thoracic;  Laterality: Right;  . WISDOM TOOTH EXTRACTION  06/2013   Social History   Social History Narrative  . Not on file   Immunization History  Administered Date(s) Administered  . PFIZER SARS-COV-2 Vaccination 07/10/2019, 08/04/2019     Objective: Vital Signs: BP (!) 144/94 (BP Location: Left Arm, Patient Position: Sitting, Cuff Size: Normal)   Pulse  84   Ht 5' 2"  (1.575 m)   Wt 218 lb (98.9 kg)   LMP 01/04/2014   BMI 39.87 kg/m    Physical Exam Constitutional:      Appearance: She is obese.  HENT:     Ears:     Comments: Redness of ear helices without tenderness Skin:    General: Skin is warm and dry.     Findings: Rash present.     Comments: Facial erythema sparing nasolaibal folds  Neurological:     Mental Status: She is alert.     Musculoskeletal Exam:  Neck full range of motion Shoulder, elbow, wrist, fingers full range of motion, right 4th MCP tenderness to pressure without active synovitis Knees full range of motion no tenderness or swelling  Investigation: No additional findings.  Imaging: No results found.  Recent Labs: Lab Results  Component Value Date   WBC 7.8 11/07/2016   HGB 12.4 11/07/2016   PLT 267 11/07/2016   NA 140 11/07/2016   K 4.0 11/07/2016   CL 107 11/07/2016   CO2 26 11/07/2016  GLUCOSE 89 11/07/2016   BUN 14 11/07/2016   CREATININE 0.77 11/07/2016   BILITOT 0.6 11/07/2016   ALKPHOS 91 11/07/2016   AST 18 11/07/2016   ALT 19 11/07/2016   PROT 7.8 11/07/2016   ALBUMIN 4.5 11/07/2016   CALCIUM 9.5 11/07/2016   GFRAA >60 11/07/2016    Speciality Comments: No specialty comments available.  Procedures:  No procedures performed Allergies: Cinnamon, Shellfish allergy, Augmentin [amoxicillin-pot clavulanate], Phenergan [promethazine hcl], and Nickel   Assessment / Plan:     Visit Diagnoses: Polyarthralgia  Facial rash- Plan: ANA, Sedimentation rate, hydroxychloroquine (PLAQUENIL) 200 MG tablet  Erythematous facial rash is consistent with malar distribution although ear involvement is less common and not provoked by UV exposure. 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 Will repeat ESR now off prednisone likely elevated and would try treatment with HCQ for current symptoms. Discussed side effects or risks including GI  intolerance, QT prolongation, skin rashes, and retinal toxicity with need for screening.  Vitamin D deficiency - Plan: Vitamin D (25 hydroxy)  Will recheck vitamin D level after latest round of high dose supplementation 50,000 units weekly.  Orders: Orders Placed This Encounter  Procedures  . Vitamin D (25 hydroxy)  . ANA  . Sedimentation rate   Meds ordered this encounter  Medications  . hydroxychloroquine (PLAQUENIL) 200 MG tablet    Sig: Take 2 tablets (400 mg total) by mouth daily.    Dispense:  60 tablet    Refill:  1    Follow-Up Instructions: Return in about 6 weeks (around 06/07/2020).   Collier Salina, MD  Note - This record has been created using [employer].  Chart creation errors have been sought, but may not always  have been located. Such creation errors do not reflect on  the standard of medical care.

## 2020-04-26 ENCOUNTER — Ambulatory Visit: Payer: Medicaid Other | Admitting: Internal Medicine

## 2020-04-26 ENCOUNTER — Other Ambulatory Visit: Payer: Self-pay

## 2020-04-26 ENCOUNTER — Encounter: Payer: Self-pay | Admitting: Internal Medicine

## 2020-04-26 VITALS — BP 144/94 | HR 84 | Ht 62.0 in | Wt 218.0 lb

## 2020-04-26 DIAGNOSIS — E559 Vitamin D deficiency, unspecified: Secondary | ICD-10-CM | POA: Diagnosis not present

## 2020-04-26 DIAGNOSIS — M2550 Pain in unspecified joint: Secondary | ICD-10-CM | POA: Diagnosis not present

## 2020-04-26 DIAGNOSIS — R21 Rash and other nonspecific skin eruption: Secondary | ICD-10-CM | POA: Diagnosis not present

## 2020-04-26 MED ORDER — HYDROXYCHLOROQUINE SULFATE 200 MG PO TABS: 400.0000 mg | ORAL_TABLET | Freq: Every day | ORAL | 1 refills | Status: DC

## 2020-04-27 LAB — SEDIMENTATION RATE: Sed Rate: 31 mm/h — ABNORMAL HIGH (ref 0–20)

## 2020-04-28 LAB — ANA: Anti Nuclear Antibody (ANA): NEGATIVE

## 2020-04-28 LAB — VITAMIN D 25 HYDROXY (VIT D DEFICIENCY, FRACTURES): Vit D, 25-Hydroxy: 45 ng/mL (ref 30–100)

## 2020-04-30 NOTE — Progress Notes (Signed)
Lab tests show increased sedimentation rate suggesting more current inflammation now off any medication. Vitamin D is negative and repeat ANA negative. I recommend continuing trial of the hydroxychloroquine as planned for suspected joint inflammation.

## 2020-05-10 DIAGNOSIS — M797 Fibromyalgia: Secondary | ICD-10-CM | POA: Insufficient documentation

## 2020-06-04 ENCOUNTER — Other Ambulatory Visit: Payer: Self-pay | Admitting: Internal Medicine

## 2020-06-04 DIAGNOSIS — R21 Rash and other nonspecific skin eruption: Secondary | ICD-10-CM

## 2020-06-04 DIAGNOSIS — M2550 Pain in unspecified joint: Secondary | ICD-10-CM

## 2020-06-06 NOTE — Progress Notes (Signed)
Office Visit Note  Patient: [name] [name]             Date of Birth: [birth year]           MRN: [id number]             PCP: Juanda Chance Referring: Mindi Curling, PA-C Visit Date: 06/07/2020   Subjective:  Follow-up (Doing good)   History of Present Illness: [name] [name] is a 30 y.o. 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 She also started gabapentin with her PCP for fibromyalgia syndrome symptoms. No significant side effects with either medication, she has been able to use muscle relaxants a bit less. Overall she feels a significant improvement in pain and most noticeably in her fatigue. Currently feeling well off any steroids. She continues to have facial rashes most commonly after days where she works.She sometimes has pain in the right 4th-5th fingers intermittently without numbness or weakness.   Review of Systems  Eyes: Negative for pain and redness.  Respiratory: Negative for shortness of breath.   Musculoskeletal: Negative for joint swelling.  Skin: Positive for rash and redness.    PMFS History:  Patient Active Problem List   Diagnosis Date Noted  . Fibromyalgia 05/10/2020  . Facial rash 04/26/2020  . Elevated sedimentation rate 03/30/2020  . Mixed hyperlipidemia 11/03/2019  . Polyarthralgia 11/03/2019  . Vitamin D deficiency 09/09/2019  . Mild episode of recurrent major depressive disorder ([place] 09/15/2018  . GAD (generalized anxiety disorder) 09/11/2018  . 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 Allergic reactions 04/06/2015  . Chronic rhinitis 04/06/2015  . Elevated blood pressure reading without diagnosis of hypertension 04/06/2015  . 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 Hot flashes due to surgical menopause 01/26/2015  . S/P hysterectomy 06/17/2014  . Pelvic pain in female 01/23/2014  . Contraception management 03/31/2013  . AKI (acute kidney  injury) ([place] 01/04/2013  . 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[place] 12/30/2012  . Hydronephrosis, right 12/30/2012  . Pleuritic chest pain 12/30/2012  . Pneumonia 12/30/2012  . Primary dysmenorrhea 03/07/2012    Past Medical History:  Diagnosis Date  . Anemia   . Anxiety    from OCD  . Arthritis    knee, ankle, left wrist  . Chronic pain    muscle spasms- entire body, pinch nerve in upper back  . Depression    doing fine now  . Fibromyalgia    Per patient  . Fracture, ankle    left- x2 - resolved  . Headache(784.0)    otc med prn  . Heartburn    zantac prn  . [facility]  . Hydronephrosis    History  . Infection    UTI -Resolved - History only  . OCD (obsessive compulsive disorder)   . Polyarthralgia    Per patient  . PONV (postoperative nausea and vomiting)   . Post traumatic stress disorder (PTSD)   . SVD (spontaneous vaginal delivery) 12/2012   [facility] . Tachycardia    Per patient    Family History  Problem Relation Age of Onset  . Heart disease Mother   . Breast cancer Mother   . Lupus Mother   . Sjogren's syndrome Mother   . Hypertension Father   . Cancer Paternal Grandmother        lung  .  Heart disease Paternal Grandmother   . Breast cancer Maternal Aunt   . Breast cancer Maternal Grandmother   . Raynaud syndrome Sister   . Lupus Sister   . Raynaud syndrome Sister    Past Surgical History:  Procedure Laterality Date  . CYSTOSCOPY WITH STENT PLACEMENT Right 12/25/2012   Procedure: CYSTOSCOPY WITH STENT PLACEMENT;  Surgeon: Franchot Gallo, MD;  Location: [facility];  Service: Urology;  Laterality: Right;  . LAPAROSCOPIC ASSISTED VAGINAL HYSTERECTOMY N/A 06/17/2014   Procedure: LAPAROSCOPIC ASSISTED VAGINAL HYSTERECTOMY;  Surgeon: Cheri Fowler, MD;  Location: [place] [facility];  Service: Gynecology;  Laterality: N/A;  . LAPAROSCOPIC BILATERAL SALPINGECTOMY Bilateral 03/31/2013   Procedure: LAPAROSCOPIC BILATERAL  SALPINGECTOMY;  Surgeon: Cheri Fowler, MD;  Location: [place] [facility];  Service: Gynecology;  Laterality: Bilateral;  . LAPAROSCOPY N/A 01/23/2014   Procedure: LAPAROSCOPY DIAGNOSTIC RIGHT PERITONEAL BIOPSY;  Surgeon: Cheri Fowler, MD;  Location: [facility];  Service: Gynecology;  Laterality: N/A;  . nephrostomy tube placement at 25wks and removed at 335/7 Right   . OOPHORECTOMY Bilateral 06/17/2014   Procedure: OOPHORECTOMY;  Surgeon: Cheri Fowler, MD;  Location: [facility];  Service: Gynecology;  Laterality: Bilateral;  . TUBAL LIGATION    . VIDEO ASSISTED THORACOSCOPY (VATS)/DECORTICATION Right 01/08/2013   Procedure: VIDEO ASSISTED THORACOSCOPY DECORTICATION; Drainage of Pleural Effusion;  Surgeon: Melrose Nakayama, MD;  Location: [place];  Service: Thoracic;  Laterality: Right;  . WISDOM TOOTH EXTRACTION  06/2013   Social History   Social History Narrative  . Not on file   Immunization History  Administered Date(s) Administered  . HPV Quadrivalent 05/09/2007, 09/02/2007  . Influenza Split 02/24/2009  . PFIZER(Purple Top)SARS-COV-2 Vaccination 07/10/2019, 08/04/2019  . Tdap 10/14/2008, 11/07/2012     Objective: Vital Signs: BP 113/69 (BP Location: Left Arm, Patient Position: Sitting, Cuff Size: Normal)   Pulse 71   Resp 14   Ht 5' 2"  (1.575 m)   Wt 214 lb (97.1 kg)   LMP 01/04/2014   BMI 39.14 kg/m    Physical Exam Constitutional:      Appearance: She is obese.  Eyes:     Conjunctiva/sclera: Conjunctivae normal.  Skin:    General: Skin is warm and dry.     Findings: No rash.  Neurological:     General: No focal deficit present.     Mental Status: She is alert.  Psychiatric:        Mood and Affect: Mood normal.     Musculoskeletal Exam:  Elbows full ROM no tenderness or swelling Wrists full ROM no tenderness or swelling Fingers full ROM no tenderness or swelling Knees full ROM no tenderness or swelling   Investigation: No additional findings.  Imaging: No results  found.  Recent Labs: Lab Results  Component Value Date   WBC 7.8 11/07/2016   HGB 12.4 11/07/2016   PLT 267 11/07/2016   NA 140 11/07/2016   K 4.0 11/07/2016   CL 107 11/07/2016   CO2 26 11/07/2016   GLUCOSE 89 11/07/2016   BUN 14 11/07/2016   CREATININE 0.77 11/07/2016   BILITOT 0.6 11/07/2016   ALKPHOS 91 11/07/2016   AST 18 11/07/2016   ALT 19 11/07/2016   PROT 7.8 11/07/2016   ALBUMIN 4.5 11/07/2016   CALCIUM 9.5 11/07/2016   GFRAA >60 11/07/2016    Speciality Comments: No specialty comments available.  Procedures:  No procedures performed Allergies: Amoxicillin-pot clavulanate, Baclofen, Cinnamon, Nickel, Other, Shellfish allergy, Phenergan [promethazine hcl], and Promethazine   Assessment / Plan:  Visit Diagnoses: Polyarthralgia - Plan: COMPLETE METABOLIC PANEL WITH GFR, Sedimentation rate  Arthralgias and fatigue improved at this time, rash does not seem inflammatory. 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 Checking CMP and repeat ESR. Discussed eye exam she has planned appt with ophthalmology for toxicity monitoring.  Facial rash  Facial rash looks like rosacea distribution. Work stress related possible vs environmental exposure, but based on no other rash or hives hypersensitivity seems less likely.  Fibromyalgia  On Cymbalta and now also gabapentin with pretty good improvement in symptoms. Her fatigue is doing better in particular. No specific changes recommended agree with PCP mgmt plan.  Orders: Orders Placed This Encounter  Procedures  . COMPLETE METABOLIC PANEL WITH GFR  . Sedimentation rate   No orders of the defined types were placed in this encounter.    Follow-Up Instructions: Return in about 6 months (around 12/05/2020) for UCTD and FMS 617mo/u.   ChCollier SalinaMD  Note - This record has been created using [employer] Chart creation errors have been sought, but may not  always  have been located. Such creation errors do not reflect on  the standard of medical care.

## 2020-06-07 ENCOUNTER — Ambulatory Visit (INDEPENDENT_AMBULATORY_CARE_PROVIDER_SITE_OTHER): Payer: Medicaid Other | Admitting: Internal Medicine

## 2020-06-07 ENCOUNTER — Other Ambulatory Visit: Payer: Self-pay

## 2020-06-07 ENCOUNTER — Encounter: Payer: Self-pay | Admitting: Internal Medicine

## 2020-06-07 VITALS — BP 113/69 | HR 71 | Resp 14 | Ht 62.0 in | Wt 214.0 lb

## 2020-06-07 DIAGNOSIS — M797 Fibromyalgia: Secondary | ICD-10-CM

## 2020-06-07 DIAGNOSIS — M2550 Pain in unspecified joint: Secondary | ICD-10-CM

## 2020-06-07 DIAGNOSIS — R21 Rash and other nonspecific skin eruption: Secondary | ICD-10-CM

## 2020-06-07 LAB — COMPLETE METABOLIC PANEL WITH GFR
AG Ratio: 2.1 (calc) (ref 1.0–2.5)
ALT: 20 U/L (ref 6–29)
AST: 16 U/L (ref 10–30)
Albumin: 4.6 g/dL (ref 3.6–5.1)
Alkaline phosphatase (APISO): 110 U/L (ref 31–125)
BUN: 11 mg/dL (ref 7–25)
CO2: 30 mmol/L (ref 20–32)
Calcium: 9.5 mg/dL (ref 8.6–10.2)
Chloride: 107 mmol/L (ref 98–110)
Creat: 0.82 mg/dL (ref 0.50–1.10)
GFR, Est African American: 111 mL/min/{1.73_m2} (ref >=60)
GFR, Est Non African American: 96 mL/min/{1.73_m2} (ref >=60)
Globulin: 2.2 g/dL (calc) (ref 1.9–3.7)
Glucose, Bld: 88 mg/dL (ref 65–99)
Potassium: 4.9 mmol/L (ref 3.5–5.3)
Sodium: 142 mmol/L (ref 135–146)
Total Bilirubin: 0.3 mg/dL (ref 0.2–1.2)
Total Protein: 6.8 g/dL (ref 6.1–8.1)

## 2020-06-07 LAB — SEDIMENTATION RATE: Sed Rate: 33 mm/h — ABNORMAL HIGH (ref 0–20)

## 2020-06-07 NOTE — Telephone Encounter (Signed)
Last Visit: 05/06/2020 Next Visit: 06/07/2020 Labs: 05/06/2020, Lab tests show increased sedimentation rate suggesting more current inflammation now off any medication. Vitamin D is negative and repeat ANA negative. I recommend continuing trial of the hydroxychloroquine as planned for suspected joint inflammation. Eye exam: No eye exam documented  Current Dose per office note 04/26/2020,  hydroxychloroquine (PLAQUENIL) 200 MG tablet     Sig: Take 2 tablets (400 mg total) by mouth daily.    Dispense:  60 tablet    Refill:  1    DX: Polyarthralgia  Last Fill: 04/26/2020  Okay to refill Plaquenil?

## 2020-06-07 NOTE — Telephone Encounter (Signed)
I called patient, patient has eye appt on 07/02/2020

## 2020-06-07 NOTE — Patient Instructions (Addendum)
Erythrocyte Sedimentation Rate Test Why am I having this test? The erythrocyte sedimentation rate (ESR) test is used to help find illnesses related to:  Sudden (acute) or long-term (chronic) infections.  Inflammation.  The body's disease-fighting system attacking healthy cells (autoimmune diseases).  Cancer.  Tissue death. If you have symptoms that may be related to any of these illnesses, your health care provider may do an ESR test before doing more specific tests. If you have an inflammatory immune disease, such as rheumatoid arthritis, you may have this test to help monitor your therapy. What is being tested? This test measures how long it takes for your red blood cells (erythrocytes) to settle in a solution over a certain amount of time (sedimentation rate). When you have an infection or inflammation, your red blood cells clump together and settle faster. The sedimentation rate provides information about how much inflammation is present in the body. What kind of sample is taken? A blood sample is required for this test. It is usually collected by inserting a needle into a blood vessel.   How do I prepare for this test? Follow any instructions from your health care provider about changing or stopping your regular medicines. Tell a health care provider about:  Any allergies you have.  All medicines you are taking, including vitamins, herbs, eye drops, creams, and over-the-counter medicines.  Any blood disorders you have.  Any surgeries you have had.  Any medical conditions you have, such as thyroid or kidney disease.  Whether you are pregnant or may be pregnant. How are the results reported? Your results will be reported as a value that measures sedimentation rate in millimeters per hour (mm/hr). Your health care provider will compare your results to normal ranges that were established after testing a large group of people (reference values). Reference values may vary among  labs and hospitals. For this test, common reference values, which vary by age and gender, are:  Newborn: 0-2 mm/hr.  Child, up to puberty: 0-10 mm/hr.  Female: ? Under 50 years: 0-20 mm/hr. ? 50-85 years: 0-30 mm/hr. ? Over 85 years: 0-42 mm/hr.  Female: ? Under 50 years: 0-15 mm/hr. ? 50-85 years: 0-20 mm/hr. ? Over 85 years: 0-30 mm/hr. Certain conditions or medicines may cause ESR levels to be falsely lower or higher, such as:  Pregnancy.  Obesity.  Steroids, birth control pills, and blood thinners.  Thyroid or kidney disease. What do the results mean? Results that are within reference values are considered normal, meaning that the level of inflammation in your body is healthy. High ESR levels mean that there is inflammation in your body. You will have more tests to help make a diagnosis. Inflammation may result from many different conditions or injuries. Talk with your health care provider about what your results mean. Questions to ask your health care provider Ask your health care provider, or the department that is doing the test:  When will my results be ready?  How will I get my results?  What are my treatment options?  What other tests do I need?  What are my next steps? Summary  The erythrocyte sedimentation rate (ESR) test is used to help find illnesses associated with sudden (acute) or long-term (chronic) infections, inflammation, autoimmune diseases, cancer, or tissue death.  If you have symptoms that may be related to any of these illnesses, your health care provider may do an ESR test before doing more specific tests. If you have an inflammatory immune disease, such as  time (sedimentation rate). This provides information about how much inflammation is present in the body.  

## 2020-06-07 NOTE — Telephone Encounter (Signed)
Will need to request ophthalmology records for further refilling medication if not already received.

## 2020-06-10 NOTE — Progress Notes (Signed)
Metabolic panel is normal so I don't see any problems with the current medications. Sedimentation rate remains slightly high, unchanged from before. I am not sure the cause for this but does not seem to track with symptom activity/

## 2020-06-22 ENCOUNTER — Telehealth: Payer: Self-pay

## 2020-06-22 NOTE — Telephone Encounter (Signed)
Spoke with patient, she got a cancellation appointment and is scheduled to see ophthalmology tomorrow.

## 2020-06-22 NOTE — Telephone Encounter (Signed)
Patient called to let Dr. Dimple Casey know that the first available appointment with her current eye doctor for Plaquenil eye exam is 08/08/20.

## 2020-08-05 ENCOUNTER — Telehealth: Payer: Self-pay | Admitting: Internal Medicine

## 2020-08-05 NOTE — Telephone Encounter (Signed)
Patient calling to let you know she is having a flare that has been going on for about 1 month now. Patient having severe bilateral hand pain, numbness with hands, extreme fatigue, horrible muscle spasms, and foggy / confused feeling. Patient uses [facility] on [address]. Please call to advise.

## 2020-08-06 NOTE — Telephone Encounter (Signed)
Patient has been scheduled for Tuesday 08/10/2020.

## 2020-08-06 NOTE — Telephone Encounter (Signed)
I recommend she comeback to clinic to take a look before treating empirically, since she describes her symptoms as being completely new and not the same as what we discussed at the start of the year. She is okay to schedule an appointment for next week.

## 2020-08-10 ENCOUNTER — Ambulatory Visit: Payer: Medicaid Other | Admitting: Internal Medicine

## 2020-08-10 NOTE — Progress Notes (Deleted)
Office Visit Note  Patient: [name] [name]             Date of Birth: [birth year]           MRN: [id number]             PCP: Barbarann Ehlers Referring: Barbarann Ehlers Visit Date: 08/10/2020   Subjective:  No chief complaint on file.   History of Present Illness: [name] [name] is a 30 y.o. 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   PMFS History:  Patient Active Problem List   Diagnosis Date Noted  . Fibromyalgia 05/10/2020  . Facial rash 04/26/2020  . Elevated sedimentation rate 03/30/2020  . Mixed hyperlipidemia 11/03/2019  . Polyarthralgia 11/03/2019  . Vitamin D deficiency 09/09/2019  . 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  . GAD (generalized anxiety disorder) 09/11/2018  . 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 Allergic reactions 04/06/2015  . Chronic rhinitis 04/06/2015  . Elevated blood pressure reading without diagnosis of hypertension 04/06/2015  . 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 Hot flashes due to surgical menopause 01/26/2015  . S/P hysterectomy 06/17/2014  . Pelvic pain in female 01/23/2014  . Contraception management 03/31/2013  . AKI (acute kidney injury) (HCC) 01/04/2013  . 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  . Hydronephrosis, right 12/30/2012  . Pleuritic chest pain 12/30/2012  . Pneumonia 12/30/2012  . Primary dysmenorrhea 03/07/2012    Past Medical History:  Diagnosis Date  . Anemia   . Anxiety    from OCD  . Arthritis    knee, ankle, left wrist  . Chronic pain    muscle spasms- entire body, pinch nerve in upper back  . Depression    doing fine now  . Fibromyalgia    Per patient  . Fracture, ankle    left- x2 - resolved  . Headache(784.0)    otc med prn  . Heartburn    zantac prn  . History of blood transfusion 12/2012   at [facility] - 1 unit transfused   . Hydronephrosis    History  . Infection    UTI -Resolved - History only  . OCD (obsessive compulsive disorder)   . Polyarthralgia    Per patient  . PONV (postoperative nausea and vomiting)   . Post traumatic stress disorder (PTSD)   . SVD (spontaneous vaginal delivery) 12/2012   x 1 at [facility]  . Tachycardia    Per patient    Family History  Problem Relation Age of Onset  . Heart disease Mother   . Breast cancer Mother   . Lupus Mother   . Sjogren's syndrome Mother   . Hypertension Father   . Cancer Paternal Grandmother        lung  . Heart disease Paternal Grandmother   . Breast cancer Maternal Aunt   . Breast cancer Maternal Grandmother   . Raynaud syndrome Sister   . Lupus Sister   . Raynaud syndrome Sister    Past Surgical History:  Procedure Laterality Date  . CYSTOSCOPY WITH STENT PLACEMENT Right 12/25/2012   Procedure: CYSTOSCOPY WITH STENT PLACEMENT;  Surgeon: Marcine Matar, MD;  Location: [facility];  Service: Urology;  Laterality: Right;  . LAPAROSCOPIC ASSISTED VAGINAL HYSTERECTOMY N/A 06/17/2014   Procedure: LAPAROSCOPIC ASSISTED VAGINAL HYSTERECTOMY;  Surgeon: Lavina Hamman, MD;  Location: [facility];  Service: Gynecology;  Laterality: N/A;  . LAPAROSCOPIC BILATERAL SALPINGECTOMY Bilateral 03/31/2013   Procedure: LAPAROSCOPIC BILATERAL SALPINGECTOMY;  Surgeon: Lavina Hamman, MD;  Location: [facility];  Service: Gynecology;  Laterality: Bilateral;  . LAPAROSCOPY N/A 01/23/2014   Procedure: LAPAROSCOPY DIAGNOSTIC RIGHT PERITONEAL BIOPSY;  Surgeon: Lavina Hamman, MD;  Location: [facility];  Service: Gynecology;  Laterality: N/A;  . nephrostomy tube placement at 25wks and removed at 335/7 Right   . OOPHORECTOMY Bilateral 06/17/2014   Procedure: OOPHORECTOMY;  Surgeon: Lavina Hamman, MD;  Location: [facility];  Service: Gynecology;  Laterality: Bilateral;  . TUBAL LIGATION    . VIDEO ASSISTED THORACOSCOPY (VATS)/DECORTICATION Right 01/08/2013   Procedure: VIDEO ASSISTED THORACOSCOPY  DECORTICATION; Drainage of Pleural Effusion;  Surgeon: Loreli Slot, MD;  Location: [facility];  Service: Thoracic;  Laterality: Right;  . WISDOM TOOTH EXTRACTION  06/2013   Social History   Social History Narrative  . Not on file   Immunization History  Administered Date(s) Administered  . HPV Quadrivalent 05/09/2007, 09/02/2007  . Influenza Split 02/24/2009  . PFIZER(Purple Top)SARS-COV-2 Vaccination 07/10/2019, 08/04/2019  . Tdap 10/14/2008, 11/07/2012     Objective: Vital Signs: LMP 01/04/2014    Physical Exam   Musculoskeletal Exam: ***  CDAI Exam: CDAI Score: -- Patient Global: --; Provider Global: -- Swollen: --; Tender: -- Joint Exam 08/10/2020   No joint exam has been documented for this visit   There is currently no information documented on the homunculus. Go to the Rheumatology activity and complete the homunculus joint exam.  Investigation: No additional findings.  Imaging: No results found.  Recent Labs: Lab Results  Component Value Date   WBC 7.8 11/07/2016   HGB 12.4 11/07/2016   PLT 267 11/07/2016   NA 142 06/07/2020   K 4.9 06/07/2020   CL 107 06/07/2020   CO2 30 06/07/2020   GLUCOSE 88 06/07/2020   BUN 11 06/07/2020   CREATININE 0.82 06/07/2020   BILITOT 0.3 06/07/2020   ALKPHOS 91 11/07/2016   AST 16 06/07/2020   ALT 20 06/07/2020   PROT 6.8 06/07/2020   ALBUMIN 4.5 11/07/2016   CALCIUM 9.5 06/07/2020   GFRAA 111 06/07/2020    Speciality Comments: No specialty comments available.  Procedures:  No procedures performed Allergies: Amoxicillin-pot clavulanate, Baclofen, Cinnamon, Nickel, Other, Shellfish allergy, Phenergan [promethazine hcl], and Promethazine   Assessment / Plan:     Visit Diagnoses: No diagnosis found.  ***  Orders: No orders of the defined types were placed in this encounter.  No orders of the defined types were placed in this encounter.    Follow-Up Instructions: No follow-ups on  file.   Fuller Plan, MD  Note - This record has been created using [employer].  Chart creation errors have been sought, but may not always  have been located. Such creation errors do not reflect on  the standard of medical care.

## 2020-08-17 ENCOUNTER — Other Ambulatory Visit: Payer: Self-pay

## 2020-08-17 ENCOUNTER — Encounter: Payer: Self-pay | Admitting: Internal Medicine

## 2020-08-17 ENCOUNTER — Ambulatory Visit: Payer: Medicaid Other | Admitting: Internal Medicine

## 2020-08-17 VITALS — BP 128/87 | HR 74 | Ht 62.0 in | Wt 216.6 lb

## 2020-08-17 DIAGNOSIS — M79641 Pain in right hand: Secondary | ICD-10-CM

## 2020-08-17 DIAGNOSIS — M359 Systemic involvement of connective tissue, unspecified: Secondary | ICD-10-CM | POA: Diagnosis not present

## 2020-08-17 DIAGNOSIS — M797 Fibromyalgia: Secondary | ICD-10-CM

## 2020-08-17 DIAGNOSIS — M79642 Pain in left hand: Secondary | ICD-10-CM | POA: Insufficient documentation

## 2020-08-17 NOTE — Progress Notes (Signed)
Office Visit Note  Patient: [name] [name]             Date of Birth: 1990/11/20           MRN: [id number]             PCP: Barbarann Ehlers Referring: Jordan Hawks, PA-C Visit Date: 08/17/2020   Subjective:  Follow-up (Patient complains of overall worsened pain. )   History of Present Illness: [name] [name] is a 30 y.o. 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 She has been experiencing increased pain all over but most problematic are in her bilateral arms distal to the elbows and in her hands. This is causing increased pain overnight and when using her hands. Symptoms are severe enough to prevent her ability to work currently with numbness and difficulty strongly gripping items. In addition she has worse fatigue constantly. She does not recall any particular event or change in situation before these increased symptoms.    Review of Systems  Constitutional: Positive for fatigue.  HENT: Positive for mouth dryness and nose dryness. Negative for mouth sores.   Eyes: Positive for visual disturbance and dryness. Negative for pain and itching.  Respiratory: Negative for cough, hemoptysis, shortness of breath and difficulty breathing.   Cardiovascular: Negative for chest pain, palpitations and swelling in legs/feet.  Gastrointestinal: Positive for constipation and diarrhea. Negative for abdominal pain and blood in stool.  Endocrine: Negative for increased urination.  Genitourinary: Negative for painful urination.  Musculoskeletal: Positive for arthralgias, joint pain, joint swelling, myalgias, muscle weakness, morning stiffness, muscle tenderness and myalgias.  Skin: Negative for color change, rash and redness.  Allergic/Immunologic: Negative for susceptible to infections.  Neurological: Positive for dizziness, numbness, headaches, memory loss and weakness.  Hematological: Negative for  swollen glands.  Psychiatric/Behavioral: Positive for confusion and sleep disturbance.    PMFS History:  Patient Active Problem List   Diagnosis Date Noted  . Undifferentiated connective tissue disease (HCC) 08/17/2020  . Bilateral hand pain 08/17/2020  . Fibromyalgia 05/10/2020  . Facial rash 04/26/2020  . Elevated sedimentation rate 03/30/2020  . Mixed hyperlipidemia 11/03/2019  . Polyarthralgia 11/03/2019  . Vitamin D deficiency 09/09/2019  . 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  . GAD (generalized anxiety disorder) 09/11/2018  . 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 Allergic reactions 04/06/2015  . Chronic rhinitis 04/06/2015  . Elevated blood pressure reading without diagnosis of hypertension 04/06/2015  . 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 Hot flashes due to surgical menopause 01/26/2015  . S/P hysterectomy 06/17/2014  . Pelvic pain in female 01/23/2014  . Contraception management 03/31/2013  . AKI (acute kidney injury) (HCC) 01/04/2013  . 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  . Hydronephrosis, right 12/30/2012  . Pleuritic chest pain 12/30/2012  . Pneumonia 12/30/2012  . Primary dysmenorrhea 03/07/2012    Past Medical History:  Diagnosis Date  . Anemia   . Anxiety    from OCD  . Arthritis    knee, ankle, left wrist  . Chronic pain    muscle spasms- entire body, pinch nerve in upper back  . Depression    doing fine now  . Fibromyalgia    Per patient  . Fracture, ankle    left- x2 - resolved  . Headache(784.0)    otc med prn  . Heartburn  zantac prn  . History of blood transfusion 12/2012   at [facility] - 1 unit transfused  . Hydronephrosis    History  . Infection    UTI -Resolved - History only  . OCD (obsessive compulsive disorder)   . Polyarthralgia    Per patient  . PONV (postoperative nausea and vomiting)   . Post traumatic stress disorder (PTSD)   . SVD (spontaneous vaginal  delivery) 12/2012   x 1 at [facility]  . Tachycardia    Per patient    Family History  Problem Relation Age of Onset  . Heart disease Mother   . Breast cancer Mother   . Lupus Mother   . Sjogren's syndrome Mother   . Hypertension Father   . Cancer Paternal Grandmother        lung  . Heart disease Paternal Grandmother   . Breast cancer Maternal Aunt   . Breast cancer Maternal Grandmother   . Raynaud syndrome Sister   . Lupus Sister   . Raynaud syndrome Sister    Past Surgical History:  Procedure Laterality Date  . CYSTOSCOPY WITH STENT PLACEMENT Right 12/25/2012   Procedure: CYSTOSCOPY WITH STENT PLACEMENT;  Surgeon: Marcine Matar, MD;  Location: [facility];  Service: Urology;  Laterality: Right;  . LAPAROSCOPIC ASSISTED VAGINAL HYSTERECTOMY N/A 06/17/2014   Procedure: LAPAROSCOPIC ASSISTED VAGINAL HYSTERECTOMY;  Surgeon: Lavina Hamman, MD;  Location: [facility];  Service: Gynecology;  Laterality: N/A;  . LAPAROSCOPIC BILATERAL SALPINGECTOMY Bilateral 03/31/2013   Procedure: LAPAROSCOPIC BILATERAL SALPINGECTOMY;  Surgeon: Lavina Hamman, MD;  Location: [facility];  Service: Gynecology;  Laterality: Bilateral;  . LAPAROSCOPY N/A 01/23/2014   Procedure: LAPAROSCOPY DIAGNOSTIC RIGHT PERITONEAL BIOPSY;  Surgeon: Lavina Hamman, MD;  Location: [facility];  Service: Gynecology;  Laterality: N/A;  . nephrostomy tube placement at 25wks and removed at 335/7 Right   . OOPHORECTOMY Bilateral 06/17/2014   Procedure: OOPHORECTOMY;  Surgeon: Lavina Hamman, MD;  Location: [facility];  Service: Gynecology;  Laterality: Bilateral;  . TUBAL LIGATION    . VIDEO ASSISTED THORACOSCOPY (VATS)/DECORTICATION Right 01/08/2013   Procedure: VIDEO ASSISTED THORACOSCOPY DECORTICATION; Drainage of Pleural Effusion;  Surgeon: Loreli Slot, MD;  Location: [facility] OR;  Service: Thoracic;  Laterality: Right;  . WISDOM TOOTH EXTRACTION  06/2013   Social History   Social History Narrative  . Not on file   Immunization History   Administered Date(s) Administered  . HPV Quadrivalent 05/09/2007, 09/02/2007  . Influenza Split 02/24/2009  . PFIZER(Purple Top)SARS-COV-2 Vaccination 07/10/2019, 08/04/2019  . Tdap 10/14/2008, 11/07/2012     Objective: Vital Signs: BP 128/87 (BP Location: Left Arm, Patient Position: Sitting, Cuff Size: Normal)   Pulse 74   Ht 5\' 2"  (1.575 m)   Wt 216 lb 9.6 oz (98.2 kg)   LMP 01/04/2014   BMI 39.62 kg/m    Physical Exam Constitutional:      Appearance: She is obese.  Eyes:     Conjunctiva/sclera: Conjunctivae normal.  Skin:    General: Skin is warm and dry.     Comments: Central facial erythema and upper chest flushing  Neurological:     General: No focal deficit present.     Mental Status: She is alert.     Sensory: No sensory deficit.     Motor: No weakness.     Comments: Positive Tinel sign in bilateral wrists and also cubital tunnel    Musculoskeletal Exam:  Elbows full ROM no tenderness or swelling Wrists full ROM tenderness without effusions Fingers  full ROM mild tenderness to pressure over MCP joints without palpable synovitis Knees full ROM no tenderness or swelling   Investigation: No additional findings.  Imaging: No results found.  Recent Labs: Lab Results  Component Value Date   WBC 7.8 11/07/2016   HGB 12.4 11/07/2016   PLT 267 11/07/2016   NA 142 06/07/2020   K 4.9 06/07/2020   CL 107 06/07/2020   CO2 30 06/07/2020   GLUCOSE 88 06/07/2020   BUN 11 06/07/2020   CREATININE 0.82 06/07/2020   BILITOT 0.3 06/07/2020   ALKPHOS 91 11/07/2016   AST 16 06/07/2020   ALT 20 06/07/2020   PROT 6.8 06/07/2020   ALBUMIN 4.5 11/07/2016   CALCIUM 9.5 06/07/2020   GFRAA 111 06/07/2020    Speciality Comments: No specialty comments available.  Procedures:  No procedures performed Allergies: Amoxicillin-pot clavulanate, Baclofen, Cinnamon, Nickel, Other, Shellfish allergy, Hydroxyzine, Phenergan [promethazine hcl], and Promethazine   Assessment /  Plan:     Visit Diagnoses: Bilateral hand pain - Plan: Ambulatory referral to Physical Medicine Rehab  The most severe current problem is in bilateral forearms and hands and exam and history are suggestive for compressive myelopathy cause. Currently no loss of sensation, no weakness, no atrophy to suggest an urgent medical condition. Referral to PM&R for nerve conduction testing to evaluate for this.  Undifferentiated connective tissue disease (HCC)  Generalized symptoms are worse but no inflammatory changes seen currently. Continue HCQ 400mg  current dose no additional medication at this time.  Fibromyalgia  Current symptoms do seem to be exacerbating baseline FMS with increased fatigue, headaches, concentration difficulty, and some generalized pain. Currently on duloxetine and gabapentin.  Orders: Orders Placed This Encounter  Procedures  . Ambulatory referral to Physical Medicine Rehab   No orders of the defined types were placed in this encounter.    Follow-Up Instructions: No follow-ups on file.   , MD  Note - This record has been created using [employer].  Chart creation errors have been sought, but may not always  have been located. Such creation errors do not reflect on  the standard of medical care.

## 2020-08-27 DIAGNOSIS — G479 Sleep disorder, unspecified: Secondary | ICD-10-CM | POA: Insufficient documentation

## 2020-09-24 ENCOUNTER — Other Ambulatory Visit: Payer: Self-pay | Admitting: Internal Medicine

## 2020-09-24 DIAGNOSIS — M2550 Pain in unspecified joint: Secondary | ICD-10-CM

## 2020-09-24 DIAGNOSIS — R21 Rash and other nonspecific skin eruption: Secondary | ICD-10-CM

## 2020-10-05 ENCOUNTER — Other Ambulatory Visit: Payer: Self-pay

## 2020-10-05 ENCOUNTER — Encounter: Payer: Self-pay | Admitting: Physical Medicine and Rehabilitation

## 2020-10-05 ENCOUNTER — Ambulatory Visit (INDEPENDENT_AMBULATORY_CARE_PROVIDER_SITE_OTHER): Payer: Medicaid Other | Admitting: Physical Medicine and Rehabilitation

## 2020-10-05 DIAGNOSIS — R202 Paresthesia of skin: Secondary | ICD-10-CM

## 2020-10-05 NOTE — Progress Notes (Signed)
Shooting pain through right fifth finger. Numbness at times in fifth finger. States that there is a spot on right elbow that causes electricity to run through whole arm when touched.  Ambidextrous No lotion per patient

## 2020-10-06 NOTE — Progress Notes (Signed)
[name] [name] - 30 y.o. female MRN [id number]  Date of birth: 1990/09/17  Office Visit Note: Visit Date: 10/05/2020 PCP: Jordan Hawks, PA-C Referred by: Jordan Hawks, PA-C  Subjective: Chief Complaint  Patient presents with   Right Hand - Pain, Numbness   HPI:  [name] [name] is a 30 y.o. female who comes in today at the request of Dr. Sheliah Hatch for electrodiagnostic study of the Right upper extremities.  Patient is Right hand dominant but somewhat ambidextrous.  Patient endorses many years of chronic essentially all over body pain but specifically she is having shooting pain to her right fifth digit.  She gets numbness at time of the right fifth digit.  She will get pain in the whole arm with a feeling of electricity through the whole arm after touching a spot in the right elbow laterally.  She denies any frank radicular symptoms but does have neck pain as well as low back pain.  She has no prior electrodiagnostic studies.  She does carry a diagnosis of fibromyalgia and Undifferentiated connective tissue disease.    ROS Otherwise per HPI.  Assessment & Plan: Visit Diagnoses:    ICD-10-CM   1. Paresthesia of skin  R20.2 NCV with EMG (electromyography)      Plan: Impression: Essentially NORMAL electrodiagnostic study of the right upper limb.  There is no significant electrodiagnostic evidence of nerve entrapment, brachial plexopathy or cervical radiculopathy.    As you know, purely sensory or demyelinating radiculopathies and chemical radiculitis may not be detected with this particular electrodiagnostic study.  Recommendations: 1.  Follow-up with referring physician. 2.  Continue current management of symptoms.  Meds & Orders: No orders of the defined types were placed in this encounter.   Orders Placed This Encounter  Procedures   NCV with EMG (electromyography)    Follow-up: Return for Sheliah Hatch, MD.   Procedures: No procedures performed  EMG & NCV  Findings: All nerve conduction studies (as indicated in the following tables) were within normal limits.    All examined muscles (as indicated in the following table) showed no evidence of electrical instability.    Impression: Essentially NORMAL electrodiagnostic study of the right upper limb.  There is no significant electrodiagnostic evidence of nerve entrapment, brachial plexopathy or cervical radiculopathy.    As you know, purely sensory or demyelinating radiculopathies and chemical radiculitis may not be detected with this particular electrodiagnostic study.  Recommendations: 1.  Follow-up with referring physician. 2.  Continue current management of symptoms.  ___________________________ Naaman Plummer FAAPMR Board Certified, American Board of Physical Medicine and Rehabilitation    Nerve Conduction Studies Anti Sensory Summary Table   Stim Site NR Peak (ms) Norm Peak (ms) P-T Amp (V) Norm P-T Amp Site1 Site2 Delta-P (ms) Dist (cm) Vel (m/s) Norm Vel (m/s)  Right Median Acr Palm Anti Sensory (2nd Digit)  31C  Wrist    2.9 <3.6 36.3 >10 Wrist Palm 1.3 0.0    Palm    1.6 <2.0 33.3         Right Radial Anti Sensory (Base 1st Digit)  30.9C  Wrist    2.1 <3.1 26.7  Wrist Base 1st Digit 2.1 0.0    Right Ulnar Anti Sensory (5th Digit)  31.3C  Wrist    2.9 <3.7 28.7 >15.0 Wrist 5th Digit 2.9 14.0 48 >38   Motor Summary Table   Stim Site NR Onset (ms) Norm Onset (ms) O-P Amp (mV) Norm O-P Amp Site1  Site2 Delta-0 (ms) Dist (cm) Vel (m/s) Norm Vel (m/s)  Right Median Motor (Abd Poll Brev)  31C  Wrist    3.0 <4.2 6.1 >5 Elbow Wrist 3.5 20.0 57 >50  Elbow    6.5  6.5         Right Ulnar Motor (Abd Dig Min)  31C  Wrist    2.8 <4.2 8.4 >3 B Elbow Wrist 3.0 19.5 65 >53  B Elbow    5.8  8.4  A Elbow B Elbow 1.2 10.0 83 >53  A Elbow    7.0  7.8          EMG   Side Muscle Nerve Root Ins Act Fibs Psw Amp Dur Poly Recrt Int Dennie Bible Comment  Right Abd Poll Brev Median C8-T1 Nml Nml Nml  Nml Nml 0 Nml Nml   Right 1stDorInt Ulnar C8-T1 Nml Nml Nml Nml Nml 0 Nml Nml   Right Deltoid Axillary C5-6 Nml Nml Nml Nml Nml 0 Nml Nml   Right Ext Digitorum  Radial (Post Int) C7-8 Nml Nml Nml Nml Nml 0 Nml Nml   Right Triceps Radial C6-7-8 Nml Nml Nml Nml Nml 0 Nml Nml     Nerve Conduction Studies Anti Sensory Left/Right Comparison   Stim Site L Lat (ms) R Lat (ms) L-R Lat (ms) L Amp (V) R Amp (V) L-R Amp (%) Site1 Site2 L Vel (m/s) R Vel (m/s) L-R Vel (m/s)  Median Acr Palm Anti Sensory (2nd Digit)  31C  Wrist  2.9   36.3  Wrist Palm     Palm  1.6   33.3        Radial Anti Sensory (Base 1st Digit)  30.9C  Wrist  2.1   26.7  Wrist Base 1st Digit     Ulnar Anti Sensory (5th Digit)  31.3C  Wrist  2.9   28.7  Wrist 5th Digit  48    Motor Left/Right Comparison   Stim Site L Lat (ms) R Lat (ms) L-R Lat (ms) L Amp (mV) R Amp (mV) L-R Amp (%) Site1 Site2 L Vel (m/s) R Vel (m/s) L-R Vel (m/s)  Median Motor (Abd Poll Brev)  31C  Wrist  3.0   6.1  Elbow Wrist  57   Elbow  6.5   6.5        Ulnar Motor (Abd Dig Min)  31C  Wrist  2.8   8.4  B Elbow Wrist  65   B Elbow  5.8   8.4  A Elbow B Elbow  83   A Elbow  7.0   7.8           Waveforms:            Clinical History: No specialty comments available.     Objective:  VS:  HT:    WT:   BMI:     BP:   HR: bpm  TEMP: ( )  RESP:  Physical Exam Musculoskeletal:        General: No swelling, tenderness or deformity.     Comments: Inspection reveals no atrophy of the bilateral APB or FDI or hand intrinsics. There is no swelling, color changes, allodynia or dystrophic changes. There is 5 out of 5 strength in the bilateral wrist extension, finger abduction and long finger flexion. There is intact sensation to light touch in all dermatomal and peripheral nerve distributions. There is a negative Hoffmann's test bilaterally.  Skin:    General: Skin is warm  and dry.     Findings: No erythema or rash.  Neurological:      General: No focal deficit present.     Mental Status: She is alert and oriented to person, place, and time.     Motor: No weakness or abnormal muscle tone.     Coordination: Coordination normal.  Psychiatric:        Mood and Affect: Mood normal.        Behavior: Behavior normal.     Imaging: No results found.

## 2020-10-06 NOTE — Procedures (Signed)
EMG & NCV Findings: All nerve conduction studies (as indicated in the following tables) were within normal limits.    All examined muscles (as indicated in the following table) showed no evidence of electrical instability.    Impression: Essentially NORMAL electrodiagnostic study of the right upper limb.  There is no significant electrodiagnostic evidence of nerve entrapment, brachial plexopathy or cervical radiculopathy.    As you know, purely sensory or demyelinating radiculopathies and chemical radiculitis may not be detected with this particular electrodiagnostic study.  Recommendations: 1.  Follow-up with referring physician. 2.  Continue current management of symptoms.  ___________________________ Naaman Plummer FAAPMR Board Certified, American Board of Physical Medicine and Rehabilitation    Nerve Conduction Studies Anti Sensory Summary Table   Stim Site NR Peak (ms) Norm Peak (ms) P-T Amp (V) Norm P-T Amp Site1 Site2 Delta-P (ms) Dist (cm) Vel (m/s) Norm Vel (m/s)  Right Median Acr Palm Anti Sensory (2nd Digit)  31C  Wrist    2.9 <3.6 36.3 >10 Wrist Palm 1.3 0.0    Palm    1.6 <2.0 33.3         Right Radial Anti Sensory (Base 1st Digit)  30.9C  Wrist    2.1 <3.1 26.7  Wrist Base 1st Digit 2.1 0.0    Right Ulnar Anti Sensory (5th Digit)  31.3C  Wrist    2.9 <3.7 28.7 >15.0 Wrist 5th Digit 2.9 14.0 48 >38   Motor Summary Table   Stim Site NR Onset (ms) Norm Onset (ms) O-P Amp (mV) Norm O-P Amp Site1 Site2 Delta-0 (ms) Dist (cm) Vel (m/s) Norm Vel (m/s)  Right Median Motor (Abd Poll Brev)  31C  Wrist    3.0 <4.2 6.1 >5 Elbow Wrist 3.5 20.0 57 >50  Elbow    6.5  6.5         Right Ulnar Motor (Abd Dig Min)  31C  Wrist    2.8 <4.2 8.4 >3 B Elbow Wrist 3.0 19.5 65 >53  B Elbow    5.8  8.4  A Elbow B Elbow 1.2 10.0 83 >53  A Elbow    7.0  7.8          EMG   Side Muscle Nerve Root Ins Act Fibs Psw Amp Dur Poly Recrt Int Dennie Bible Comment  Right Abd Poll Brev Median C8-T1 Nml  Nml Nml Nml Nml 0 Nml Nml   Right 1stDorInt Ulnar C8-T1 Nml Nml Nml Nml Nml 0 Nml Nml   Right Deltoid Axillary C5-6 Nml Nml Nml Nml Nml 0 Nml Nml   Right Ext Digitorum  Radial (Post Int) C7-8 Nml Nml Nml Nml Nml 0 Nml Nml   Right Triceps Radial C6-7-8 Nml Nml Nml Nml Nml 0 Nml Nml     Nerve Conduction Studies Anti Sensory Left/Right Comparison   Stim Site L Lat (ms) R Lat (ms) L-R Lat (ms) L Amp (V) R Amp (V) L-R Amp (%) Site1 Site2 L Vel (m/s) R Vel (m/s) L-R Vel (m/s)  Median Acr Palm Anti Sensory (2nd Digit)  31C  Wrist  2.9   36.3  Wrist Palm     Palm  1.6   33.3        Radial Anti Sensory (Base 1st Digit)  30.9C  Wrist  2.1   26.7  Wrist Base 1st Digit     Ulnar Anti Sensory (5th Digit)  31.3C  Wrist  2.9   28.7  Wrist 5th Digit  48  Motor Left/Right Comparison   Stim Site L Lat (ms) R Lat (ms) L-R Lat (ms) L Amp (mV) R Amp (mV) L-R Amp (%) Site1 Site2 L Vel (m/s) R Vel (m/s) L-R Vel (m/s)  Median Motor (Abd Poll Brev)  31C  Wrist  3.0   6.1  Elbow Wrist  57   Elbow  6.5   6.5        Ulnar Motor (Abd Dig Min)  31C  Wrist  2.8   8.4  B Elbow Wrist  65   B Elbow  5.8   8.4  A Elbow B Elbow  83   A Elbow  7.0   7.8           Waveforms:

## 2020-11-05 ENCOUNTER — Encounter: Payer: Self-pay | Admitting: Internal Medicine

## 2020-12-05 NOTE — Progress Notes (Signed)
Office Visit Note  Patient: [name] [name]             Date of Birth: 07/19/1990           MRN: [id number]             PCP: Barbarann Ehlers Referring: Jordan Hawks, PA-C Visit Date: 12/06/2020   Subjective:  Follow-up (Improving)   History of Present Illness: [name] [name] is a 30 y.o. 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 She no longer takes Cymbalta and feels it was worsening her depression symptoms and not significantly improving pain and other complaints.  She saw Dr. Alvester Morin with [facility] on that was overall normal appearing 6/21.  She has not noticed recurrence of skin rashes and erythema for about 2 months.  She did sustain a right wrist injury dropping an aquarium from overhead treated conservatively with bracing and has improved.  Currently worst affected joints are her bilateral knees and also in the hips.  She has been recommended by her PCP office to focus on continue to work on diet and exercise for weight reduction of the symptoms but she feels her exercise capacity is limited and becomes very fatigued after straining herself.  She had an uncomplicated antibiotic treatment for UTI also with persistent bilateral ear symptoms suspected as ear infections so far not improved with simple antibiotic and steroid treatments.  She is scheduled to see otolaryngology in another week for follow-up of this problem.   Previous HPI: 08/17/20 [name] [name] is a 30 y.o. 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 She has been experiencing increased pain all over but most problematic are in her bilateral arms distal to the elbows and in her hands. This is causing increased pain overnight and when using her hands. Symptoms are severe enough to prevent her ability to work currently with numbness and difficulty strongly gripping items. In addition she has worse fatigue  constantly. She does not recall any particular event or change in situation before these increased symptoms.    Review of Systems  Constitutional:  Positive for fatigue.  HENT:  Positive for mouth dryness.   Eyes:  Positive for dryness.  Respiratory:  Negative for shortness of breath.   Cardiovascular:  Positive for swelling in legs/feet.  Gastrointestinal:  Negative for constipation.  Endocrine: Positive for heat intolerance.  Genitourinary:  Positive for difficulty urinating and painful urination.  Musculoskeletal:  Positive for joint pain, gait problem, joint pain, joint swelling, muscle weakness, morning stiffness and muscle tenderness.  Skin:  Negative for rash.  Allergic/Immunologic: Positive for susceptible to infections.  Neurological:  Positive for numbness and weakness.  Hematological:  Negative for bruising/bleeding tendency.  Psychiatric/Behavioral:  Positive for sleep disturbance.    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   Allergic reactions 04/06/2015   Chronic rhinitis 04/06/2015  Elevated blood pressure reading without diagnosis of hypertension 04/06/2015   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   Hot flashes due to surgical menopause 01/26/2015   S/P hysterectomy 06/17/2014   Pelvic pain in female 01/23/2014   Contraception management 03/31/2013   AKI (acute kidney injury) (HCC) 01/04/2013   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HCC)  12/30/2012   Hydronephrosis, right 12/30/2012   Pleuritic chest pain 12/30/2012   Pneumonia 12/30/2012   Primary dysmenorrhea 03/07/2012    Past Medical History:  Diagnosis Date   Anemia    Anxiety    from OCD   Arthritis    knee, ankle, left wrist   Chronic pain    muscle spasms- entire body, pinch nerve in upper back   Depression    doing fine now   Fibromyalgia    Per patient   Fracture, ankle    left- x2 - resolved   Headache(784.0)    otc med prn   Heartburn    zantac prn   History of blood transfusion 12/2012   at [facility] - 1 unit transfused   Hydronephrosis    History   Infection    UTI -Resolved - History only   OCD (obsessive compulsive disorder)    Polyarthralgia    Per patient   PONV (postoperative nausea and vomiting)    Post traumatic stress disorder (PTSD)    SVD (spontaneous vaginal delivery) 12/2012   x 1 at [facility]   Tachycardia    Per patient    Family History  Problem Relation Age of Onset   Heart disease Mother    Breast cancer Mother    Lupus Mother    Sjogren's syndrome Mother    Hypertension Father    Cancer Paternal Grandmother        lung   Heart disease Paternal Grandmother    Breast cancer Maternal Aunt    Breast cancer Maternal Grandmother    Raynaud syndrome Sister    Lupus Sister    Raynaud syndrome Sister    Past Surgical History:  Procedure Laterality Date   CYSTOSCOPY WITH STENT PLACEMENT Right 12/25/2012   Procedure: CYSTOSCOPY WITH STENT PLACEMENT;  Surgeon: Marcine Matar, MD;  Location: [facility];  Service: Urology;  Laterality: Right;   LAPAROSCOPIC ASSISTED VAGINAL HYSTERECTOMY N/A 06/17/2014   Procedure: LAPAROSCOPIC ASSISTED VAGINAL HYSTERECTOMY;  Surgeon: Lavina Hamman, MD;  Location: [facility];  Service: Gynecology;  Laterality: N/A;   LAPAROSCOPIC BILATERAL SALPINGECTOMY Bilateral 03/31/2013   Procedure: LAPAROSCOPIC BILATERAL SALPINGECTOMY;  Surgeon: Lavina Hamman, MD;  Location: [facility];  Service: Gynecology;   Laterality: Bilateral;   LAPAROSCOPY N/A 01/23/2014   Procedure: LAPAROSCOPY DIAGNOSTIC RIGHT PERITONEAL BIOPSY;  Surgeon: Lavina Hamman, MD;  Location: [facility];  Service: Gynecology;  Laterality: N/A;   nephrostomy tube placement at 25wks and removed at 335/7 Right    OOPHORECTOMY Bilateral 06/17/2014   Procedure: OOPHORECTOMY;  Surgeon: Lavina Hamman, MD;  Location: [facility];  Service: Gynecology;  Laterality: Bilateral;   TUBAL LIGATION     VIDEO ASSISTED THORACOSCOPY (VATS)/DECORTICATION Right 01/08/2013   Procedure: VIDEO ASSISTED THORACOSCOPY DECORTICATION; Drainage of Pleural Effusion;  Surgeon: Loreli Slot, MD;  Location: [facility] OR;  Service: Thoracic;  Laterality: Right;   WISDOM TOOTH EXTRACTION  06/2013   Social History   Social History Narrative   Not on file   Immunization History  Administered Date(s) Administered   HPV Quadrivalent 05/09/2007, 09/02/2007   Influenza Split 02/24/2009   PFIZER Comirnaty(Gray Top)Covid-19 Tri-Sucrose Vaccine 08/27/2020  PFIZER(Purple Top)SARS-COV-2 Vaccination 07/10/2019, 08/04/2019   Tdap 10/14/2008, 11/07/2012     Objective: Vital Signs: BP 113/76 (BP Location: Left Arm, Patient Position: Sitting, Cuff Size: Normal)   Pulse 92   Resp 14   Ht 5\' 4"  (1.626 m)   Wt 223 lb (101.2 kg)   LMP 01/04/2014   BMI 38.28 kg/m    Physical Exam Constitutional:      Appearance: She is obese.  Eyes:     Conjunctiva/sclera: Conjunctivae normal.  Cardiovascular:     Rate and Rhythm: Normal rate and regular rhythm.  Pulmonary:     Effort: Pulmonary effort is normal.     Breath sounds: Normal breath sounds.  Skin:    General: Skin is warm and dry.     Findings: No rash.  Neurological:     General: No focal deficit present.     Mental Status: She is alert.  Psychiatric:        Mood and Affect: Mood normal.     Musculoskeletal Exam:  Shoulders full ROM no tenderness or swelling Elbows full ROM no tenderness or swelling Wrists full ROM  no tenderness or swelling Fingers full ROM no tenderness or swelling Knees joint line tenderness to pressure bilaterally and some anteriorly towards pes anserine bursa without obvious swelling, normal ROM b/l Ankles full ROM no tenderness or swelling   Investigation: No additional findings.  Imaging: No results found.  Recent Labs: Lab Results  Component Value Date   WBC 7.8 11/07/2016   HGB 12.4 11/07/2016   PLT 267 11/07/2016   NA 142 06/07/2020   K 4.9 06/07/2020   CL 107 06/07/2020   CO2 30 06/07/2020   GLUCOSE 88 06/07/2020   BUN 11 06/07/2020   CREATININE 0.82 06/07/2020   BILITOT 0.3 06/07/2020   ALKPHOS 91 11/07/2016   AST 16 06/07/2020   ALT 20 06/07/2020   PROT 6.8 06/07/2020   ALBUMIN 4.5 11/07/2016   CALCIUM 9.5 06/07/2020   GFRAA 111 06/07/2020    Speciality Comments: No specialty comments available.  Procedures:  No procedures performed Allergies: Amoxicillin-pot clavulanate, Baclofen, Cinnamon, Nickel, Other, Shellfish allergy, Duloxetine hcl, Hydroxyzine, Phenergan [promethazine hcl], and Promethazine   Assessment / Plan:     Visit Diagnoses: Undifferentiated connective tissue disease  Erythematous rashes appear improved joint pains remain a problem particularly in the lower extremities.  This does seem more consistent with some osteoarthritis type pain and her somewhat deconditioning.  Tolerating the medicine fine.  Continue hydroxychloroquine 400 mg p.o. daily.  Normal CBC in May reviewed no repeat test for drug monitoring today.  Fibromyalgia  Reviewed again importance of trying to maintain sleep maintain regular activity for the generalized fibromyalgia symptoms.  She would benefit with progression of her exercise such as graduated exercise therapy.  Did not tolerate the duloxetine for an SNRI treatment but has not noticed specific worsening after stopping the medicine.  Bilateral hand pain  Hand pain with tingling or shocking type sensation  down the fourth and fifth digits but had normal nerve conduction study recently.  This does not exclude central disease process but seems somewhat less likely at this time.  Right wrist pain with a minor trauma but no residual swelling or decreased range of motion today.  Facial rash  Facial and upper extremity rashes none present today she says at least 2 months since last significant flareup.  This may represent a treatment response to the hydroxychloroquine since she has mostly remained free of steroid treatment.  Class 2 obesity with body mass index (BMI) of 38.0 to 38.9 in adult, unspecified obesity type, unspecified whether serious comorbidity present  Obesity with progressive weight gain over the past 3 years and probably contributing to generalized symptoms somewhat into the bilateral knee and hip pain directly.  She has not had great success with self-directed weight loss due to exercise capacity limitation.  Will refer to nutrition and weight management clinic for additional recommendations and counseling.  Orders: No orders of the defined types were placed in this encounter.  No orders of the defined types were placed in this encounter.    Follow-Up Instructions: Return in about 6 months (around 06/08/2021) for UCTD/FMS f/u 6mos.   Fuller Planhristopher W Elexa Kivi, MD  Note - This record has been created using [employer] software.  Chart creation errors have been sought, but may not always  have been located. Such creation errors do not reflect on  the standard of medical care.

## 2020-12-06 ENCOUNTER — Ambulatory Visit: Payer: Medicaid Other | Admitting: Internal Medicine

## 2020-12-06 ENCOUNTER — Other Ambulatory Visit: Payer: Self-pay

## 2020-12-06 ENCOUNTER — Encounter: Payer: Self-pay | Admitting: Internal Medicine

## 2020-12-06 VITALS — BP 113/76 | HR 92 | Resp 14 | Ht 64.0 in | Wt 223.0 lb

## 2020-12-06 DIAGNOSIS — M797 Fibromyalgia: Secondary | ICD-10-CM

## 2020-12-06 DIAGNOSIS — R21 Rash and other nonspecific skin eruption: Secondary | ICD-10-CM

## 2020-12-06 DIAGNOSIS — M79642 Pain in left hand: Secondary | ICD-10-CM

## 2020-12-06 DIAGNOSIS — M2550 Pain in unspecified joint: Secondary | ICD-10-CM

## 2020-12-06 DIAGNOSIS — M79641 Pain in right hand: Secondary | ICD-10-CM | POA: Diagnosis not present

## 2020-12-06 DIAGNOSIS — Z6838 Body mass index (BMI) 38.0-38.9, adult: Secondary | ICD-10-CM

## 2020-12-06 DIAGNOSIS — M359 Systemic involvement of connective tissue, unspecified: Secondary | ICD-10-CM

## 2020-12-06 DIAGNOSIS — E669 Obesity, unspecified: Secondary | ICD-10-CM

## 2020-12-13 DIAGNOSIS — H608X3 Other otitis externa, bilateral: Secondary | ICD-10-CM | POA: Insufficient documentation

## 2020-12-20 ENCOUNTER — Other Ambulatory Visit: Payer: Self-pay | Admitting: Internal Medicine

## 2020-12-20 DIAGNOSIS — R21 Rash and other nonspecific skin eruption: Secondary | ICD-10-CM

## 2020-12-20 DIAGNOSIS — M2550 Pain in unspecified joint: Secondary | ICD-10-CM

## 2021-01-18 NOTE — Progress Notes (Deleted)
Office Visit Note  Patient: [name] [name]             Date of Birth: January 18, 1991           MRN: [id number]             PCP: Barbarann Ehlers Referring: Barbarann Ehlers Visit Date: 01/19/2021   Subjective:  No chief complaint on file.   History of Present Illness: [name] [name] is a 30 y.o. female here for follow up ***     No Rheumatology ROS completed.   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   Allergic reactions 04/06/2015   Chronic rhinitis 04/06/2015   Elevated blood pressure reading without diagnosis of hypertension 04/06/2015   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   Hot flashes due to surgical menopause 01/26/2015   S/P hysterectomy 06/17/2014   Pelvic pain in female 01/23/2014   Contraception management 03/31/2013   AKI (acute kidney injury) (HCC) 01/04/2013   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   Hydronephrosis, right 12/30/2012   Pleuritic chest pain 12/30/2012   Pneumonia 12/30/2012   Primary dysmenorrhea 03/07/2012    Past Medical History:  Diagnosis Date   Anemia    Anxiety    from OCD   Arthritis    knee, ankle, left wrist   Chronic pain    muscle spasms- entire body, pinch nerve in upper back   Depression    doing fine now   Fibromyalgia    Per patient   Fracture, ankle    left- x2 - resolved   Headache(784.0)    otc med prn   Heartburn    zantac prn   History of blood transfusion 12/2012    at [facility] - 1 unit transfused   Hydronephrosis    History   Infection    UTI -Resolved - History only   OCD (obsessive compulsive disorder)    Polyarthralgia    Per patient   PONV (postoperative nausea and vomiting)    Post traumatic stress disorder (PTSD)    SVD (spontaneous vaginal delivery) 12/2012   x 1 at [facility]   Tachycardia    Per patient    Family History  Problem Relation Age of Onset   Heart disease Mother    Breast cancer Mother    Lupus Mother    Sjogren's syndrome Mother    Hypertension Father    Cancer Paternal Grandmother        lung   Heart disease Paternal Grandmother    Breast cancer Maternal Aunt    Breast cancer Maternal Grandmother    Raynaud syndrome Sister    Lupus Sister    Raynaud syndrome Sister    Past Surgical History:  Procedure Laterality Date   CYSTOSCOPY WITH STENT PLACEMENT Right 12/25/2012   Procedure: CYSTOSCOPY WITH STENT PLACEMENT;  Surgeon: Marcine Matar, MD;  Location: [facility];  Service: Urology;  Laterality: Right;   LAPAROSCOPIC ASSISTED VAGINAL HYSTERECTOMY N/A 06/17/2014  Procedure: LAPAROSCOPIC ASSISTED VAGINAL HYSTERECTOMY;  Surgeon: Lavina Hamman, MD;  Location: [facility];  Service: Gynecology;  Laterality: N/A;   LAPAROSCOPIC BILATERAL SALPINGECTOMY Bilateral 03/31/2013   Procedure: LAPAROSCOPIC BILATERAL SALPINGECTOMY;  Surgeon: Lavina Hamman, MD;  Location: [facility];  Service: Gynecology;  Laterality: Bilateral;   LAPAROSCOPY N/A 01/23/2014   Procedure: LAPAROSCOPY DIAGNOSTIC RIGHT PERITONEAL BIOPSY;  Surgeon: Lavina Hamman, MD;  Location: [facility];  Service: Gynecology;  Laterality: N/A;   nephrostomy tube placement at 25wks and removed at 335/7 Right    OOPHORECTOMY Bilateral 06/17/2014   Procedure: OOPHORECTOMY;  Surgeon: Lavina Hamman, MD;  Location: [facility];  Service: Gynecology;  Laterality: Bilateral;   TUBAL LIGATION     VIDEO ASSISTED THORACOSCOPY (VATS)/DECORTICATION Right 01/08/2013   Procedure: VIDEO ASSISTED  THORACOSCOPY DECORTICATION; Drainage of Pleural Effusion;  Surgeon: Loreli Slot, MD;  Location: [facility];  Service: Thoracic;  Laterality: Right;   WISDOM TOOTH EXTRACTION  06/2013   Social History   Social History Narrative   Not on file   Immunization History  Administered Date(s) Administered   HPV Quadrivalent 05/09/2007, 09/02/2007   Influenza Split 02/24/2009   PFIZER Comirnaty(Gray Top)Covid-19 Tri-Sucrose Vaccine 08/27/2020   PFIZER(Purple Top)SARS-COV-2 Vaccination 07/10/2019, 08/04/2019   Tdap 10/14/2008, 11/07/2012     Objective: Vital Signs: LMP 01/04/2014    Physical Exam   Musculoskeletal Exam: ***  CDAI Exam: CDAI Score: -- Patient Global: --; Provider Global: -- Swollen: --; Tender: -- Joint Exam 01/19/2021   No joint exam has been documented for this visit   There is currently no information documented on the homunculus. Go to the Rheumatology activity and complete the homunculus joint exam.  Investigation: No additional findings.  Imaging: No results found.  Recent Labs: Lab Results  Component Value Date   WBC 7.8 11/07/2016   HGB 12.4 11/07/2016   PLT 267 11/07/2016   NA 142 06/07/2020   K 4.9 06/07/2020   CL 107 06/07/2020   CO2 30 06/07/2020   GLUCOSE 88 06/07/2020   BUN 11 06/07/2020   CREATININE 0.82 06/07/2020   BILITOT 0.3 06/07/2020   ALKPHOS 91 11/07/2016   AST 16 06/07/2020   ALT 20 06/07/2020   PROT 6.8 06/07/2020   ALBUMIN 4.5 11/07/2016   CALCIUM 9.5 06/07/2020   GFRAA 111 06/07/2020    Speciality Comments: No specialty comments available.  Procedures:  No procedures performed Allergies: Amoxicillin-pot clavulanate, Baclofen, Cinnamon, Nickel, Other, Shellfish allergy, Duloxetine hcl, Hydroxyzine, Phenergan [promethazine hcl], and Promethazine   Assessment / Plan:     Visit Diagnoses: No diagnosis found.  ***  Orders: No orders of the defined types were placed in this encounter.  No orders of the defined  types were placed in this encounter.    Follow-Up Instructions: No follow-ups on file.   Fuller Plan, MD  Note - This record has been created using [employer].  Chart creation errors have been sought, but may not always  have been located. Such creation errors do not reflect on  the standard of medical care.

## 2021-01-19 ENCOUNTER — Ambulatory Visit: Payer: Medicaid Other | Admitting: Internal Medicine

## 2021-04-20 ENCOUNTER — Ambulatory Visit: Payer: Medicaid Other | Admitting: Internal Medicine

## 2021-04-20 NOTE — Progress Notes (Deleted)
Office Visit Note  Patient: [name] [name]             Date of Birth: 08-16-1990           MRN: [id number]             PCP: Juanda Chance Referring: Juanda Chance Visit Date: 04/20/2021   Subjective:  No chief complaint on file.   History of Present Illness: [name] [name] is a 31 y.o. 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 ***   Previous HPI 12/06/20 [name] [name] is a 31 y.o. 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 She no longer takes Cymbalta and feels it was worsening her depression symptoms and not significantly improving pain and other complaints. She saw Dr. Ernestina Patches with [facility] on that was overall normal appearing 6/21.  She has not noticed recurrence of skin rashes and erythema for about 2 months.  She did sustain a right wrist injury dropping an aquarium from overhead treated conservatively with bracing and has improved.  Currently worst affected joints are her bilateral knees and also in the hips.  She has been recommended by her PCP office to focus on continue to work on diet and exercise for weight reduction of the symptoms but she feels her exercise capacity is limited and becomes very fatigued after straining herself.  She had an uncomplicated antibiotic treatment for UTI also with persistent bilateral ear symptoms suspected as ear infections so far not improved with simple antibiotic and steroid treatments.  She is scheduled to see otolaryngology in another week for follow-up of this problem.   Previous HPI: 03/30/20 [name] [name] is a 31 y.o. female here for evaluation of elevated sedimentation rate and arthralgias. She has had joint pains for many years but this has become worse in previous months and also has concern on account of family history with RA and SLE. She has been started on Cymbalta previously for fibromyalgia and possible osteoarthritis pain without very great relief. Workup showed negative  antibody serology but she has had elevated sedimentation rate of 54 without obvious cause. Symptoms vary in severity and she does experience exertion intolerance with several days of decreased functional ability after overdoing it. She was recently treated with a short course of prednisone that improved symptoms which is usual for her to see large benefit on steroids. 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   No Rheumatology ROS completed.   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   Allergic reactions 04/06/2015   Chronic rhinitis 04/06/2015   Elevated blood pressure reading without diagnosis of hypertension 04/06/2015   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   Hot flashes due to surgical menopause 01/26/2015   S/P hysterectomy 06/17/2014   Pelvic pain in female  01/23/2014   Contraception management 03/31/2013   AKI (acute kidney injury) ([place]) 01/04/2013   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[place]) 12/30/2012   Hydronephrosis, right 12/30/2012   Pleuritic chest pain 12/30/2012   Pneumonia 12/30/2012   Primary dysmenorrhea 03/07/2012    Past Medical History:  Diagnosis Date   Anemia    Anxiety    from OCD   Arthritis    knee, ankle, left wrist   Chronic pain    muscle spasms- entire body, pinch nerve in upper back   Depression     doing fine now   Fibromyalgia    Per patient   Fracture, ankle    left- x2 - resolved   Headache(784.0)    otc med prn   Heartburn    zantac prn   History of blood transfusion 12/2012   at [facility] - 1 unit transfused   Hydronephrosis    History   Infection    UTI -Resolved - History only   OCD (obsessive compulsive disorder)    Polyarthralgia    Per patient   PONV (postoperative nausea and vomiting)    Post traumatic stress disorder (PTSD)    SVD (spontaneous vaginal delivery) 12/2012   x 1 at [facility]   Tachycardia    Per patient    Family History  Problem Relation Age of Onset   Heart disease Mother    Breast cancer Mother    Lupus Mother    Sjogren's syndrome Mother    Hypertension Father    Cancer Paternal Grandmother        lung   Heart disease Paternal Grandmother    Breast cancer Maternal Aunt    Breast cancer Maternal Grandmother    Raynaud syndrome Sister    Lupus Sister    Raynaud syndrome Sister    Past Surgical History:  Procedure Laterality Date   CYSTOSCOPY WITH STENT PLACEMENT Right 12/25/2012   Procedure: CYSTOSCOPY WITH STENT PLACEMENT;  Surgeon: Franchot Gallo, MD;  Location: [facility];  Service: Urology;  Laterality: Right;   LAPAROSCOPIC ASSISTED VAGINAL HYSTERECTOMY N/A 06/17/2014   Procedure: LAPAROSCOPIC ASSISTED VAGINAL HYSTERECTOMY;  Surgeon: Cheri Fowler, MD;  Location: [facility];  Service: Gynecology;  Laterality: N/A;   LAPAROSCOPIC BILATERAL SALPINGECTOMY Bilateral 03/31/2013   Procedure: LAPAROSCOPIC BILATERAL SALPINGECTOMY;  Surgeon: Cheri Fowler, MD;  Location: [facility];  Service: Gynecology;  Laterality: Bilateral;   LAPAROSCOPY N/A 01/23/2014   Procedure: LAPAROSCOPY DIAGNOSTIC RIGHT PERITONEAL BIOPSY;  Surgeon: Cheri Fowler, MD;  Location: [facility];  Service: Gynecology;  Laterality: N/A;   nephrostomy tube placement at 25wks and removed at 335/7 Right    OOPHORECTOMY Bilateral 06/17/2014   Procedure: OOPHORECTOMY;  Surgeon: Cheri Fowler, MD;  Location: [facility];  Service: Gynecology;  Laterality: Bilateral;   TUBAL LIGATION     VIDEO ASSISTED THORACOSCOPY (VATS)/DECORTICATION Right 01/08/2013   Procedure: VIDEO ASSISTED THORACOSCOPY DECORTICATION; Drainage of Pleural Effusion;  Surgeon: Melrose Nakayama, MD;  Location: [facility];  Service: Thoracic;  Laterality: Right;   WISDOM TOOTH EXTRACTION  06/2013   Social History   Social History Narrative   Not on file   Immunization History  Administered Date(s) Administered   HPV Quadrivalent 05/09/2007, 09/02/2007   Influenza Split 02/24/2009   PFIZER Comirnaty(Gray Top)Covid-19 Tri-Sucrose Vaccine 08/27/2020   PFIZER(Purple Top)SARS-COV-2 Vaccination 07/10/2019, 08/04/2019   Tdap 10/14/2008, 11/07/2012     Objective: Vital Signs: LMP 01/04/2014    Physical Exam   Musculoskeletal Exam: ***  CDAI Exam: CDAI Score: -- Patient Global: --;  Provider Global: -- Swollen: --; Tender: -- Joint Exam 04/20/2021   No joint exam has been documented for this visit   There is currently no information documented on the homunculus. Go to the Rheumatology activity and complete the homunculus joint exam.  Investigation: No additional findings.  Imaging: No results found.  Recent Labs: Lab Results  Component Value Date   WBC 7.8 11/07/2016   HGB 12.4 11/07/2016   PLT 267 11/07/2016   NA 142 06/07/2020   K 4.9 06/07/2020   CL 107 06/07/2020   CO2 30 06/07/2020   GLUCOSE 88 06/07/2020   BUN 11 06/07/2020   CREATININE 0.82 06/07/2020   BILITOT 0.3 06/07/2020   ALKPHOS 91 11/07/2016   AST 16 06/07/2020   ALT 20 06/07/2020   PROT 6.8 06/07/2020   ALBUMIN 4.5 11/07/2016   CALCIUM 9.5 06/07/2020   GFRAA 111 06/07/2020    Speciality Comments: No specialty comments available.  Procedures:  No procedures performed Allergies: Amoxicillin-pot clavulanate, Baclofen, Cinnamon, Nickel, Other, Shellfish allergy, Duloxetine hcl, Hydroxyzine, Phenergan [promethazine  hcl], and Promethazine   Assessment / Plan:     Visit Diagnoses: No diagnosis found.  ***  Orders: No orders of the defined types were placed in this encounter.  No orders of the defined types were placed in this encounter.    Follow-Up Instructions: No follow-ups on file.   Collier Salina, MD  Note - This record has been created using [employer].  Chart creation errors have been sought, but may not always  have been located. Such creation errors do not reflect on  the standard of medical care.

## 2021-05-09 ENCOUNTER — Ambulatory Visit: Payer: Medicaid Other | Admitting: Internal Medicine

## 2021-05-09 ENCOUNTER — Encounter: Payer: Self-pay | Admitting: Internal Medicine

## 2021-05-09 ENCOUNTER — Other Ambulatory Visit: Payer: Self-pay

## 2021-05-09 VITALS — BP 146/101 | HR 65 | Ht 62.0 in | Wt 227.8 lb

## 2021-05-09 DIAGNOSIS — R700 Elevated erythrocyte sedimentation rate: Secondary | ICD-10-CM | POA: Diagnosis not present

## 2021-05-09 DIAGNOSIS — M25512 Pain in left shoulder: Secondary | ICD-10-CM | POA: Diagnosis not present

## 2021-05-09 DIAGNOSIS — M25551 Pain in right hip: Secondary | ICD-10-CM | POA: Insufficient documentation

## 2021-05-09 DIAGNOSIS — M797 Fibromyalgia: Secondary | ICD-10-CM

## 2021-05-09 DIAGNOSIS — M359 Systemic involvement of connective tissue, unspecified: Secondary | ICD-10-CM

## 2021-05-09 MED ORDER — MELOXICAM 15 MG PO TABS: 15.0000 mg | ORAL_TABLET | Freq: Every day | ORAL | 0 refills | Status: DC

## 2021-05-09 NOTE — Patient Instructions (Signed)
Stretching and range-of-motion exercises These exercises warm up your muscles and joints and improve the movement and flexibility of your shoulder. These exercises also help to relieve pain. Shoulder pendulum In this exercise, you let the injured arm dangle toward the floor and then swing it like a clock pendulum. Stand near a table or counter that you can hold onto for balance. Bend forward at the waist and let your left / right arm hang straight down. Use your other arm to support you and help you stay balanced. Relax your left / right arm and shoulder muscles, and move your hips and your trunk so your left / right arm swings freely. Your arm should swing because of the motion of your body, not because you are using your arm or shoulder muscles. Keep moving your hips and trunk so your arm swings in the following directions, as told by your health care provider: Side to side. Forward and backward. In clockwise and counterclockwise circles. Slowly return to the starting position. Repeat __________ times, or for __________ seconds per direction. Complete this exercise __________ times Shoulder abduction, active-assisted You will need a stick, broom handle, or similar object to help you (assist) in doing this exercise. Lie on your back. This is the supine position. Hold a broomstick, a cane, or a similar object. Place your hands a little more than shoulder-width apart on the object. Your left / right hand should be palm-up, and your other hand should be palm-down. Keeping your shoulder relaxed, push the stick to raise your left / right arm out to your side (abduction) and then over your head. Use your other hand to help move the stick. Stop when you feel a stretch in your shoulder, or when you reach the angle that is recommended by your health care provider. Avoid shrugging your shoulder while you raise your arm. Keep your shoulder blade tucked down toward the middle of your back. Hold for  __________ seconds. Slowly return to the starting position. Repeat __________ times. Complete this exercise __________ times a day. Shoulder flexion, active-assisted  Lie on your back. You may bend your knees for comfort. Hold a broomstick, a cane, or a similar object so that your hands are about shoulder-width apart. Your palms should face toward your feet. Raise your left / right arm over your head, then behind your head toward the floor (flexion). Use your other hand to help you do this (active-assisted). Stop when you feel a gentle stretch in your shoulder, or when you reach the angle that is recommended by your health care provider. Hold for __________ seconds. Use the stick and your other arm to help you return your left / right arm to the starting position. Repeat __________ times. Complete this exercise __________ times a day. External rotation  Sit in a stable chair without armrests, or stand up. Tuck a soft object, such as a folded towel or a small ball, under your left / right upper arm. Hold a broomstick, a cane, or a similar object with your palms face-down, toward the floor. Bend your elbows to a 90-degree angle (right angle), and keep your hands about shoulder-width apart. Straighten your healthy arm and push the stick across your body, toward your left / right side. Keep your left / right arm bent. This will rotate your left / right forearm away from your body (external rotation). Hold for __________ seconds. Slowly return to the starting position. Repeat __________ times. Complete this exercise __________ times a day. This information  is not intended to replace advice given to you by your health care provider. Make sure you discuss any questions you have with your health care provider. Document Revised: 10/02/2019 Document Reviewed: 10/02/2019 Elsevier Patient Education  2022 [employer].

## 2021-05-09 NOTE — Progress Notes (Signed)
Office Visit Note  Patient: [name] [name]             Date of Birth: 1991/03/31           MRN: [id number]             PCP: Juanda Chance Referring: Mindi Curling, PA-C Visit Date: 05/09/2021   Subjective:  Pain of the Right Hand (Right > left), Pain of the Left Hand, Pain of the Left Shoulder, Pain of the Right Hip, and Other (Tattoos become raised, all of them, breaks out into a rash around them, and then those joints start aching.)   History of Present Illness: [name] CUCUZZA is a 31 y.o. 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  She was doing overall okay has been worse since she got sick with pneumonia and required antibiotics treatment.  First she developed severe worsening of her facial rash with warm and hot sensation in the area.  She experienced worsening joint pain in the left shoulder and some worsening of her right hip although that was chronically painful.  Besides joint pain she noticed some swelling around the margins of numerous skin tattoos this was coming and going.  She felt there is increase in joint pain near areas where she was seeing the tattoo swelling.  Previous HPI 12/06/20 [name] [name] is a 31 y.o. 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 She no longer takes Cymbalta and feels it was worsening her depression symptoms and not significantly improving pain and other complaints. She saw Dr. Ernestina Patches with [facility] on that was overall normal appearing 6/21.  She has not noticed recurrence of skin rashes and erythema for about 2 months.  She did sustain a right wrist injury dropping an aquarium from overhead treated conservatively with bracing and has improved.  Currently worst affected joints are her bilateral knees and also in the hips.  She has been recommended by her PCP office to focus on continue to work on diet and exercise for weight reduction of the symptoms but she feels her exercise capacity is limited and  becomes very fatigued after straining herself.  She had an uncomplicated antibiotic treatment for UTI also with persistent bilateral ear symptoms suspected as ear infections so far not improved with simple antibiotic and steroid treatments.  She is scheduled to see otolaryngology in another week for follow-up of this problem.   Previous HPI: 08/17/20 [name] [name] is a 31 y.o. 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 She has been experiencing increased pain all over but most problematic are in her bilateral arms distal to the elbows and in her hands. This is causing increased pain overnight and when using her hands. Symptoms are severe enough to prevent her ability to work currently with numbness and difficulty strongly gripping items. In addition she has worse fatigue constantly. She does not recall any particular event or change in situation before these increased symptoms.    Review of Systems  Constitutional:  Negative for weight loss.  HENT:  Negative for mouth sores.   Respiratory:  Negative for difficulty breathing.   Musculoskeletal:  Positive for joint pain, joint pain and morning stiffness.  Skin:  Positive for rash.   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   GAD (generalized anxiety disorder) 09/11/2018   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   Allergic reactions 04/06/2015    Chronic rhinitis 04/06/2015   Elevated blood pressure reading without diagnosis of hypertension 04/06/2015   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   Hot flashes due to surgical menopause 01/26/2015   S/P hysterectomy 06/17/2014   Pelvic pain in female 01/23/2014   Contraception management 03/31/2013   AKI (acute kidney injury) ([place]) 01/04/2013   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[place]) 12/30/2012   Hydronephrosis, right 12/30/2012   Pleuritic chest pain 12/30/2012   Pneumonia 12/30/2012   Primary dysmenorrhea 03/07/2012    Past Medical History:  Diagnosis Date   Anemia    Anxiety    from OCD   Arthritis    knee, ankle, left wrist   Chronic pain    muscle spasms- entire body, pinch nerve in upper back   Depression    doing fine now   Fibromyalgia    Per patient   Fracture, ankle    left- x2 - resolved   Headache(784.0)    otc med prn   Heartburn    zantac prn   History of blood transfusion 12/2012   at [facility] - 1 unit transfused   Hydronephrosis    History   Infection    UTI -Resolved - History only   OCD (obsessive compulsive disorder)    Polyarthralgia    Per patient   PONV (postoperative nausea and vomiting)    Post traumatic stress disorder (PTSD)    SVD (spontaneous vaginal delivery) 12/2012   x 1 at [facility] [place]   Tachycardia    Per patient    Family History  Problem Relation Age of Onset   Heart disease Mother    Breast cancer Mother    Lupus Mother    Sjogren's syndrome Mother    Hypertension Father    Cancer Paternal Grandmother        lung   Heart disease Paternal Grandmother    Breast cancer Maternal Aunt    Breast cancer Maternal Grandmother    Raynaud syndrome Sister    Lupus Sister    Raynaud syndrome Sister    Past Surgical History:  Procedure Laterality Date   CYSTOSCOPY WITH STENT PLACEMENT Right 12/25/2012   Procedure: CYSTOSCOPY WITH STENT PLACEMENT;  Surgeon: Franchot Gallo, MD;  [facility];  Service: Urology;  Laterality: Right;    LAPAROSCOPIC ASSISTED VAGINAL HYSTERECTOMY N/A 06/17/2014   Procedure: LAPAROSCOPIC ASSISTED VAGINAL HYSTERECTOMY;  Surgeon: Cheri Fowler, MD;  Location: [facility];  Service: Gynecology;  Laterality: N/A;   LAPAROSCOPIC BILATERAL SALPINGECTOMY Bilateral 03/31/2013   Procedure: LAPAROSCOPIC BILATERAL SALPINGECTOMY;  Surgeon: Cheri Fowler, MD;  Location: [facility];  Service: Gynecology;  Laterality: Bilateral;   LAPAROSCOPY N/A 01/23/2014   Procedure: LAPAROSCOPY DIAGNOSTIC RIGHT PERITONEAL BIOPSY;  Surgeon: Cheri Fowler, MD;  Location: [facility];  Service: Gynecology;  Laterality: N/A;   nephrostomy tube placement at 25wks and removed at 335/7 Right    OOPHORECTOMY Bilateral 06/17/2014   Procedure: OOPHORECTOMY;  Surgeon: Cheri Fowler, MD;  Location: [facility];  Service: Gynecology;  Laterality: Bilateral;   TUBAL LIGATION     VIDEO ASSISTED THORACOSCOPY (VATS)/DECORTICATION Right 01/08/2013   Procedure: VIDEO ASSISTED THORACOSCOPY DECORTICATION; Drainage of Pleural Effusion;  Surgeon: Melrose Nakayama, MD;  Location: [facility];  Service: Thoracic;  Laterality: Right;   WISDOM TOOTH EXTRACTION  06/2013   Social History   Social History Narrative   Not on file   Immunization History  Administered Date(s) Administered   HPV Quadrivalent 05/09/2007, 09/02/2007   Influenza Split 02/24/2009   PFIZER Comirnaty(Gray Top)Covid-19 Tri-Sucrose Vaccine 08/27/2020   PFIZER(Purple Top)SARS-COV-2 Vaccination 07/10/2019, 08/04/2019   Tdap 10/14/2008, 11/07/2012     Objective: Vital Signs: BP (!) 146/101    Pulse 65    Ht 5\' 2"  (1.575 m)    Wt 227 lb 12.8 oz (103.3 kg)    LMP 01/04/2014    BMI 41.67 kg/m    Physical Exam HENT:     Mouth/Throat:     Mouth: Mucous membranes are moist.     Pharynx: Oropharynx is clear. No oropharyngeal exudate.     Comments: Mild posterior erythema present Cardiovascular:     Rate and Rhythm: Normal rate and regular rhythm.  Pulmonary:     Effort: Pulmonary effort is  normal.     Breath sounds: Normal breath sounds.  Skin:    General: Skin is warm and dry.     Findings: No rash.     Comments: Central facial erythema without lesions or significant papules  Neurological:     Mental Status: She is alert.  Psychiatric:        Mood and Affect: Mood normal.     Musculoskeletal Exam:  Neck full ROM no tenderness Shoulders full ROM, left shoulder pain is provoked with overhead abduction, pain to resisted abduction and with empty can test no pain to cross arm test, focal tenderness to palpation posterior of shoulder extending part way above spine of scapula Elbows full ROM no tenderness or swelling Wrists full ROM no tenderness or swelling Fingers full ROM no tenderness or swelling Right hip pain localized anteriorly somewhat provoked with resisted flexion otherwise not reproduced by direct palpation or internal/external rotation   Investigation: No additional findings.  Imaging: No results found.  Recent Labs: Lab Results  Component Value Date   WBC 7.8 11/07/2016   HGB 12.4 11/07/2016   PLT 267 11/07/2016   NA 142 06/07/2020   K 4.9 06/07/2020   CL 107 06/07/2020   CO2 30 06/07/2020   GLUCOSE 88 06/07/2020   BUN 11 06/07/2020   CREATININE 0.82 06/07/2020   BILITOT 0.3 06/07/2020   ALKPHOS 91 11/07/2016   AST 16 06/07/2020   ALT 20 06/07/2020   PROT 6.8 06/07/2020   ALBUMIN 4.5 11/07/2016   CALCIUM 9.5 06/07/2020   GFRAA 111 06/07/2020    Speciality Comments: No specialty comments available.  Procedures:  No procedures performed Allergies: Amoxicillin-pot clavulanate, Baclofen, Cinnamon, Nickel, Other, Shellfish allergy, Duloxetine hcl, Hydroxyzine, Phenergan [promethazine hcl], and Promethazine   Assessment / Plan:     Visit Diagnoses: Undifferentiated connective tissue disease ([place]) Fibromyalgia Elevated sedimentation rate  Overall chronic disease state appears relatively stable does seem like she had some worsening of the  overall pain and skin inflammation after recent infection but most of this has improved except for her couple areas of continued joint pain.  She is continuing the hydroxychloroquine 400 mg daily without incident.  I am not sure  why she is having the swelling around borders of old tattoos possibly allergy process would help to see this in action recommended she take a picture for next follow-up if it keeps recurring.  Acute pain of left shoulder - Plan: meloxicam (MOBIC) 15 MG tablet  Shoulder pain appears localized to supraspinatus most likely does not seem to be any glenohumeral joint arthritis or bursitis. Not sure what provoked this worsening.  Recommend trying conservative treatments first discussed using heat on the affected area intermittently, prescribed meloxicam 15 mg to take once daily with food for at least 2 weeks, provided range of motion exercises to perform daily.  If symptoms are not improving by next follow-up which is scheduled for next month can consider more aggressive work-up and treatment.  Pain in right hip  Right hip pain is ongoing apparently not much different than her very chronic hip pain it does affect her gait somewhat.  I think this might also improve temporarily while taking the scheduled NSAIDs could recheck at our next follow-up.  Orders: No orders of the defined types were placed in this encounter.  Meds ordered this encounter  Medications   meloxicam (MOBIC) 15 MG tablet    Sig: Take 1 tablet (15 mg total) by mouth daily.    Dispense:  20 tablet    Refill:  0     Follow-Up Instructions: Return in about 4 weeks (around 06/06/2021), or if symptoms worsen or fail to improve.   Collier Salina, MD  Note - This record has been created using [employer].  Chart creation errors have been sought, but may not always  have been located. Such creation errors do not reflect on  the standard of medical care.

## 2021-05-27 ENCOUNTER — Other Ambulatory Visit: Payer: Self-pay | Admitting: Internal Medicine

## 2021-05-27 DIAGNOSIS — M25512 Pain in left shoulder: Secondary | ICD-10-CM

## 2021-06-06 ENCOUNTER — Ambulatory Visit: Payer: Medicaid Other | Admitting: Internal Medicine

## 2021-06-08 ENCOUNTER — Encounter: Payer: Self-pay | Admitting: Internal Medicine

## 2021-06-08 ENCOUNTER — Ambulatory Visit: Payer: Medicaid Other | Admitting: Internal Medicine

## 2021-06-08 ENCOUNTER — Other Ambulatory Visit: Payer: Self-pay

## 2021-06-08 VITALS — BP 121/79 | HR 91 | Resp 15 | Ht 62.0 in | Wt 222.0 lb

## 2021-06-08 DIAGNOSIS — M359 Systemic involvement of connective tissue, unspecified: Secondary | ICD-10-CM

## 2021-06-08 DIAGNOSIS — M797 Fibromyalgia: Secondary | ICD-10-CM

## 2021-06-08 DIAGNOSIS — M2550 Pain in unspecified joint: Secondary | ICD-10-CM | POA: Diagnosis not present

## 2021-06-08 NOTE — Progress Notes (Signed)
Office Visit Note  Patient: [name] [name]             Date of Birth: 15-Aug-1990           MRN: [id number]             PCP: Juanda Chance Referring: Mindi Curling, PA-C Visit Date: 06/08/2021   Subjective:   History of Present Illness: [name] [name] is a 31 y.o. 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 She also had increase in rash and swelling around margins of her old tattoos coming and going. The shoulder and hip pain are greatly improved with the meloxicam. She has not noticed any side effect or intolerance with the medication either. Her skin rash around the tattoos is better but she has increased redness and irritation on her face and torso and arms. She also feels very fatigued worse than usual with this.  Previous HPI 05/09/21 [name] [name] is a 31 y.o. 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  She was doing overall okay has been worse since she got sick with pneumonia and required antibiotics treatment.  First she developed severe worsening of her facial rash with warm and hot sensation in the area.  She experienced worsening joint pain in the left shoulder and some worsening of her right hip although that was chronically painful.  Besides joint pain she noticed some swelling around the margins of numerous skin tattoos this was coming and going.  She felt there is increase in joint pain near areas where she was seeing the tattoo swelling.    Previous HPI: 08/17/20 [name] [name] is a 31 y.o. 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 She has been experiencing increased pain all over but most problematic are in her bilateral arms distal to the elbows and in her hands. This is causing increased pain overnight and when  using her hands. Symptoms are severe enough to prevent her ability to work currently with numbness and difficulty strongly gripping items. In addition she has worse fatigue constantly. She does not recall any particular event or change in situation before these increased symptoms   Review of Systems  Constitutional:  Positive for fatigue.  HENT:  Positive for mouth dryness.   Eyes:  Positive for dryness.  Respiratory:  Negative for shortness of breath.   Cardiovascular:  Negative for swelling in legs/feet.  Gastrointestinal:  Positive for constipation and diarrhea.  Endocrine: Positive for cold intolerance.  Genitourinary:  Negative for difficulty urinating.  Musculoskeletal:  Positive for joint pain, gait problem, joint pain, muscle weakness, morning stiffness and muscle tenderness.  Skin:  Positive for rash.  Allergic/Immunologic: Negative for susceptible to infections.  Neurological:  Positive for weakness.  Hematological:  Positive for bruising/bleeding tendency.  Psychiatric/Behavioral:  Positive for sleep disturbance.    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   GAD (generalized anxiety disorder) 09/11/2018   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   Allergic reactions 04/06/2015   Chronic rhinitis 04/06/2015   Elevated blood pressure reading without diagnosis of hypertension 04/06/2015   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   Hot flashes due to surgical  menopause 01/26/2015   S/P hysterectomy 06/17/2014   Pelvic pain in female 01/23/2014   Contraception management 03/31/2013   AKI (acute kidney injury) ([place]) 01/04/2013   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[place]) 12/30/2012   Hydronephrosis, right 12/30/2012   Pleuritic chest pain 12/30/2012   Pneumonia 12/30/2012   Primary dysmenorrhea 03/07/2012    Past Medical History:  Diagnosis Date   Anemia    Anxiety    from OCD   Arthritis    knee, ankle, left wrist   Chronic pain    muscle spasms- entire body, pinch nerve in upper back   Depression    doing fine now   Fibromyalgia    Per patient   Fracture, ankle    left- x2 - resolved   Headache(784.0)    otc med prn   Heartburn    zantac prn   History of blood transfusion 12/2012   at [facility] - 1 unit transfused   Hydronephrosis    History   Infection    UTI -Resolved - History only   OCD (obsessive compulsive disorder)    Polyarthralgia    Per patient   PONV (postoperative nausea and vomiting)    Post traumatic stress disorder (PTSD)    SVD (spontaneous vaginal delivery) 12/2012   x 1 at [facility]   Tachycardia    Per patient    Family History  Problem Relation Age of Onset   Heart disease Mother    Breast cancer Mother    Lupus Mother    Sjogren's syndrome Mother    Hypertension Father    Cancer Paternal Grandmother        lung   Heart disease Paternal Grandmother    Breast cancer Maternal Aunt    Breast cancer Maternal Grandmother    Raynaud syndrome Sister    Lupus Sister    Raynaud syndrome Sister    Past Surgical History:  Procedure Laterality Date   CYSTOSCOPY WITH STENT PLACEMENT Right 12/25/2012   Procedure: CYSTOSCOPY WITH STENT PLACEMENT;  Surgeon: Franchot Gallo, MD;  Location: [facility];  Service: Urology;  Laterality: Right;   LAPAROSCOPIC ASSISTED VAGINAL HYSTERECTOMY N/A 06/17/2014   Procedure: LAPAROSCOPIC ASSISTED VAGINAL HYSTERECTOMY;  Surgeon: Cheri Fowler, MD;  Location: [facility];  Service: Gynecology;   Laterality: N/A;   LAPAROSCOPIC BILATERAL SALPINGECTOMY Bilateral 03/31/2013   Procedure: LAPAROSCOPIC BILATERAL SALPINGECTOMY;  Surgeon: Cheri Fowler, MD;  Location: [facility];  Service: Gynecology;  Laterality: Bilateral;   LAPAROSCOPY N/A 01/23/2014   Procedure: LAPAROSCOPY DIAGNOSTIC RIGHT PERITONEAL BIOPSY;  Surgeon: Cheri Fowler, MD;  Location: [facility];  Service: Gynecology;  Laterality: N/A;   nephrostomy tube placement at 25wks and removed at 335/7 Right    OOPHORECTOMY Bilateral 06/17/2014   Procedure: OOPHORECTOMY;  Surgeon: Cheri Fowler, MD;  Location: [facility];  Service: Gynecology;  Laterality: Bilateral;   TUBAL LIGATION     VIDEO ASSISTED THORACOSCOPY (VATS)/DECORTICATION Right 01/08/2013   Procedure: VIDEO ASSISTED THORACOSCOPY DECORTICATION; Drainage of Pleural Effusion;  Surgeon: Melrose Nakayama, MD;  Location: [facility];  Service:  Thoracic;  Laterality: Right;   WISDOM TOOTH EXTRACTION  06/2013   Social History   Social History Narrative   Not on file   Immunization History  Administered Date(s) Administered   HPV Quadrivalent 05/09/2007, 09/02/2007   Influenza Split 02/24/2009   PFIZER Comirnaty(Gray Top)Covid-19 Tri-Sucrose Vaccine 08/27/2020   PFIZER(Purple Top)SARS-COV-2 Vaccination 07/10/2019, 08/04/2019   Tdap 10/14/2008, 11/07/2012     Objective: Vital Signs: BP 121/79 (BP Location: Left Arm, Patient Position: Sitting, Cuff Size: Normal)    Pulse 91    Resp 15    Ht 5\' 2"  (1.575 m)    Wt 222 lb (100.7 kg)    LMP 01/04/2014    BMI 40.60 kg/m    Physical Exam Constitutional:      Appearance: She is obese.  Cardiovascular:     Rate and Rhythm: Normal rate and regular rhythm.  Pulmonary:     Effort: Pulmonary effort is normal.     Breath sounds: Normal breath sounds.  Skin:    General: Skin is warm and dry.     Findings: Rash present.     Comments: Central facial blanching erythema no lesions no macules or papules  Neurological:     Mental Status: She is  alert.     Musculoskeletal Exam: Shoulders full ROM no tenderness or swelling Elbows full ROM no tenderness or swelling Wrists full ROM no tenderness or swelling Fingers full ROM no tenderness or swelling Knees joint line tenderness to pressure bilaterally, full ROM intact no effusions Ankles full ROM no tenderness or swelling   Investigation: No additional findings.  Imaging: No results found.  Recent Labs: Lab Results  Component Value Date   WBC 9.1 06/08/2021   HGB 12.9 06/08/2021   PLT 323 06/08/2021   NA 142 06/07/2020   K 4.9 06/07/2020   CL 107 06/07/2020   CO2 30 06/07/2020   GLUCOSE 88 06/07/2020   BUN 11 06/07/2020   CREATININE 0.82 06/07/2020   BILITOT 0.3 06/07/2020   ALKPHOS 91 11/07/2016   AST 16 06/07/2020   ALT 20 06/07/2020   PROT 6.8 06/07/2020   ALBUMIN 4.5 11/07/2016   CALCIUM 9.5 06/07/2020   GFRAA 111 06/07/2020    Speciality Comments: No specialty comments available.  Procedures:  No procedures performed Allergies: Amoxicillin-pot clavulanate, Baclofen, Cinnamon, Nickel, Other, Shellfish allergy, Duloxetine hcl, Hydroxyzine, Phenergan [promethazine hcl], and Promethazine   Assessment / Plan:     Visit Diagnoses: Undifferentiated connective tissue disease ([place]) - Plan: Sedimentation rate, C-reactive protein, CBC with Differential/Platelet, C3 and C4  Symptoms seem pretty nonspecific not sure if this is an increase in inflammation or maybe more stress response type of problem worsening FMS. Checking sed rate, CRP, serum complement, and CBC again today. If these remain the same or better would not recommend empirically trying additional immunosuppression. Plan to continue HCQ 400 mg daily.  Polyarthralgia  Body pains ongoing issue, hip and shoulder better with the meloxicam plan to continue this recommended taking as needed if possible not every day to improve tolerance and effectiveness.  Fibromyalgia  May be contributing to increase in  fatigue and body pains not sure about skin rashes though. No specific other medical changes noted at this time.   Orders: Orders Placed This Encounter  Procedures   Sedimentation rate   C-reactive protein   CBC with Differential/Platelet   C3 and C4   No orders of the defined types were placed in this encounter.    Follow-Up Instructions: Return in  about 3 months (around 09/05/2021) for ?UCTD on HCQ/NSAID f/u 9mos.   Collier Salina, MD  Note - This record has been created using [employer].  Chart creation errors have been sought, but may not always  have been located. Such creation errors do not reflect on  the standard of medical care.

## 2021-06-09 LAB — SEDIMENTATION RATE: Sed Rate: 31 mm/h — ABNORMAL HIGH (ref 0–20)

## 2021-06-09 LAB — CBC WITH DIFFERENTIAL/PLATELET
Absolute Monocytes: 573 cells/uL (ref 200–950)
Basophils Absolute: 46 cells/uL (ref 0–200)
Basophils Relative: 0.5 %
Eosinophils Absolute: 182 cells/uL (ref 15–500)
Eosinophils Relative: 2 %
HCT: 39.7 % (ref 35.0–45.0)
Hemoglobin: 12.9 g/dL (ref 11.7–15.5)
Lymphs Abs: 2748 cells/uL (ref 850–3900)
MCH: 25.2 pg — ABNORMAL LOW (ref 27.0–33.0)
MCHC: 32.5 g/dL (ref 32.0–36.0)
MCV: 77.5 fL — ABNORMAL LOW (ref 80.0–100.0)
MPV: 10.9 fL (ref 7.5–12.5)
Monocytes Relative: 6.3 %
Neutro Abs: 5551 cells/uL (ref 1500–7800)
Neutrophils Relative %: 61 %
Platelets: 323 10*3/uL (ref 140–400)
RBC: 5.12 10*6/uL — ABNORMAL HIGH (ref 3.80–5.10)
RDW: 13.4 % (ref 11.0–15.0)
Total Lymphocyte: 30.2 %
WBC: 9.1 10*3/uL (ref 3.8–10.8)

## 2021-06-09 LAB — C3 AND C4
C3 Complement: 199 mg/dL — ABNORMAL HIGH (ref 83–193)
C4 Complement: 35 mg/dL (ref 15–57)

## 2021-06-09 LAB — C-REACTIVE PROTEIN: CRP: 3.2 mg/L (ref <8.0)

## 2021-06-13 NOTE — Progress Notes (Signed)
Labs show no significant change in inflammation markers. The red blood cells count is normal with decrease in size, this can sometimes indicate low iron. Has she noticed any bleeding or menstruation changes? It may be worth checking iron levels this could contribute to fatigue I'm not sure about the increased rashes.

## 2021-06-24 ENCOUNTER — Other Ambulatory Visit: Payer: Self-pay | Admitting: Internal Medicine

## 2021-06-24 DIAGNOSIS — M25512 Pain in left shoulder: Secondary | ICD-10-CM

## 2021-07-07 ENCOUNTER — Encounter (HOSPITAL_BASED_OUTPATIENT_CLINIC_OR_DEPARTMENT_OTHER): Payer: Self-pay | Admitting: Emergency Medicine

## 2021-07-07 ENCOUNTER — Emergency Department (HOSPITAL_BASED_OUTPATIENT_CLINIC_OR_DEPARTMENT_OTHER)
Admission: EM | Admit: 2021-07-07 | Discharge: 2021-07-07 | Disposition: A | Discharge status: Home / Self Care | Payer: Medicaid Other | Attending: Emergency Medicine | Admitting: Emergency Medicine

## 2021-07-07 DIAGNOSIS — R21 Rash and other nonspecific skin eruption: Secondary | ICD-10-CM | POA: Diagnosis present

## 2021-07-07 MED ORDER — PREDNISONE 10 MG (21) PO TBPK: ORAL_TABLET | Freq: Every day | ORAL | 0 refills | Status: DC

## 2021-07-07 NOTE — ED Triage Notes (Addendum)
Pt has rash on right hip, buttock and posterior right thigh onset 13 days ago. Pt has possible tick bite on back. Pt has been hiking recently. Pt states she went to [facility] for rash and was placed on steroids, but is concerned for tick born illness.  ?

## 2021-07-07 NOTE — ED Provider Notes (Signed)
?[facility] EMERGENCY DEPARTMENT ?Provider Note ? ? ?CSN: [id number] ?Arrival date & time: 07/07/21  0321 ? ?  ? ?History ? ?Chief Complaint  ?Patient presents with  ? Rash  ? ? ?[name] [name] is a 31 y.o. female. ? ?Patient with a rash for a couple weeks. Started near her waist line on right side a few days after a hike. Started topical steroids which didn't seem to help so switched to oral steroids a couple days ago. Persistent and some new lesions so presents here for concern of possible tick borne illness. Has a h/o some type of undiagnosed connective tissue disorder but rashes aren't like this. It is not itchy. Doesn't hurt. Wonders if it might be rignworm.  ? ? ?Rash ? ?  ? ?Home Medications ?Prior to Admission medications   ?Medication Sig Start Date End Date Taking? Authorizing Provider  ?fluticasone (FLONASE) 50 MCG/ACT nasal spray as needed. 02/08/17   [provider]  ?hydroxychloroquine (PLAQUENIL) 200 MG tablet TAKE 2 TABLETS(400 MG) BY MOUTH DAILY 12/20/20   Rice, Jamesetta Orleans, MD  ?lisdexamfetamine (VYVANSE) 30 MG capsule Take by mouth. 10/08/20   [provider]  ?LORazepam (ATIVAN) 1 MG tablet Take by mouth. 11/05/20   [provider]  ?meloxicam (MOBIC) 15 MG tablet TAKE 1 TABLET(15 MG) BY MOUTH DAILY 06/24/21   Rice, Jamesetta Orleans, MD  ?predniSONE (STERAPRED UNI-PAK 21 TAB) 10 MG (21) TBPK tablet Take by mouth daily. 4 tabs for 2 days, then 3 tabs for 2 days, 2 tabs for 2 days, then 1 tab by mouth daily for 2 days 07/07/21  Yes Kimoni Pickerill, Barbara Cower, MD  ?propranolol ER (INDERAL LA) 60 MG 24 hr capsule Take 60 mg by mouth daily. 03/18/20   [provider]  ?rosuvastatin (CRESTOR) 10 MG tablet Take 10 mg by mouth at bedtime. 03/16/20   [provider]  ?tiZANidine (ZANAFLEX) 4 MG tablet Take 4 mg by mouth at bedtime. 03/18/20   [provider]  ?valACYclovir (VALTREX) 1000 MG tablet Take 1,000 mg by mouth daily. 03/18/20   [provider]  ?    ? ?Allergies    ?Amoxicillin-pot clavulanate, Baclofen, Cinnamon, Nickel, Other, Shellfish allergy, Duloxetine hcl, Hydroxyzine, Phenergan [promethazine hcl], and Promethazine   ? ?Review of Systems   ?Review of Systems  ?Skin:  Positive for rash.  ? ?Physical Exam ?Updated Vital Signs ?BP 127/86 (BP Location: Right Arm)   Pulse 75   Temp 98.2 ?F (36.8 ?C) (Oral)   Resp 19   Ht 5\' 2"  (1.575 m)   Wt 98 kg   LMP 01/04/2014   SpO2 99%   BMI 39.51 kg/m?  ?Physical Exam ?Vitals and nursing note reviewed.  ?Constitutional:   ?   Appearance: She is well-developed.  ?HENT:  ?   Head: Normocephalic and atraumatic.  ?   Nose: Nose normal. No congestion or rhinorrhea.  ?   Mouth/Throat:  ?   Mouth: Mucous membranes are moist.  ?Eyes:  ?   Pupils: Pupils are equal, round, and reactive to light.  ?Cardiovascular:  ?   Rate and Rhythm: Normal rate and regular rhythm.  ?Pulmonary:  ?   Effort: Pulmonary effort is normal. No respiratory distress.  ?   Breath sounds: No stridor.  ?Abdominal:  ?   General: Abdomen is flat. There is no distension.  ?Musculoskeletal:  ?   Cervical back: Normal range of motion.  ?Skin: ?   General: Skin is warm and dry.  ?  Findings: Rash (non raised rash to right hip, new circular lesions with darker areas toward the edges on inner right and left thigh. non raised. not itchy.) present.  ?Neurological:  ?   General: No focal deficit present.  ?   Mental Status: She is alert.  ? ? ?ED Results / Procedures / Treatments   ?Labs ?(all labs ordered are listed, but only abnormal results are displayed) ?Labs Reviewed - No data to display ? ?EKG ?None ? ?Radiology ?No results found. ? ?Procedures ?Procedures  ? ? ?Medications Ordered in ED ?Medications - No data to display ? ?ED Course/ Medical Decision Making/ A&P ?  ?                        ?Medical Decision Making ?Risk ?Prescription drug management. ? ? ?Rash appears fungal but not itdching, so unlikely.  ?Could be eczema but is in odd spot for  that.  ?Doesn't appear c/w hives.  ?Doesn't appear similar to RMSF or lyme. ?She will continue steroids and if helping, will taper. If not then will try antifungal and fu w/ dermatology.  ? ? ?Final Clinical Impression(s) / ED Diagnoses ?Final diagnoses:  ?Rash  ? ? ?Rx / DC Orders ?ED Discharge Orders   ? ?      Ordered  ?  predniSONE (STERAPRED UNI-PAK 21 TAB) 10 MG (21) TBPK tablet  Daily       ? 07/07/21 0434  ? ?  ?  ? ?  ? ? ?  ?Marily Memos, MD ?07/07/21 [phone] ? ?

## 2021-07-07 NOTE — Discharge Instructions (Signed)
If the steroids you are on seem to start helping, then taper off with the prescription provided, otherwise throw it away.  ? ?If they don't help, it may be worth trying lotrimin ring worm cream in case it is a fungus. ? ?Either way, if you want a definitive diagnosis you need to see a dermatologist for further management.  ?

## 2021-07-25 ENCOUNTER — Other Ambulatory Visit: Payer: Self-pay | Admitting: Internal Medicine

## 2021-07-25 DIAGNOSIS — M25512 Pain in left shoulder: Secondary | ICD-10-CM

## 2021-09-06 ENCOUNTER — Ambulatory Visit: Payer: Medicaid Other | Admitting: Internal Medicine

## 2021-09-06 DIAGNOSIS — M359 Systemic involvement of connective tissue, unspecified: Secondary | ICD-10-CM

## 2021-09-06 DIAGNOSIS — M797 Fibromyalgia: Secondary | ICD-10-CM

## 2021-09-06 DIAGNOSIS — Z79899 Other long term (current) drug therapy: Secondary | ICD-10-CM

## 2021-09-06 DIAGNOSIS — M2550 Pain in unspecified joint: Secondary | ICD-10-CM

## 2021-09-13 NOTE — Progress Notes (Unsigned)
Office Visit Note  Patient: [name] [name]             Date of Birth: December 18, 1990           MRN: [id number]             PCP: Barbarann Ehlers Referring: Barbarann Ehlers Visit Date: 09/14/2021   Subjective:  No chief complaint on file.   History of Present Illness: [name] [name] is a 31 y.o. 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 She also had increase in rash and swelling around margins of her old tattoos coming and going. The shoulder and hip pain are greatly improved with the meloxicam. She has not noticed any side effect or intolerance with the medication either. Her skin rash around the tattoos is better but she has increased redness and irritation on her face and torso and arms. She also feels very fatigued worse than usual with this.   Previous HPI 05/09/21 [name] [name] is a 31 y.o. 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  She was doing overall okay has been worse since she got sick with pneumonia and required antibiotics treatment.  First she developed severe worsening of her facial rash with warm and hot sensation in the area.  She experienced worsening joint pain in the left shoulder and some worsening of her right hip although that was chronically painful.  Besides joint pain she noticed some swelling around the margins of numerous skin tattoos this was coming and going.  She felt there is increase in joint pain near areas where she was seeing the tattoo swelling.    Previous HPI: 08/17/20 [name] [name] is a 31 y.o. 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 She has been experiencing increased  pain all over but most problematic are in her bilateral arms distal to the elbows and in her hands. This is causing increased pain overnight and when using her hands. Symptoms are severe enough to prevent her ability to work currently with numbness and difficulty strongly gripping items. In addition she has worse fatigue constantly. She does not recall any particular event or change in situation before these increased symptoms   No Rheumatology ROS completed.   PMFS History:  Patient Active Problem List   Diagnosis Date Noted   Pain in left shoulder 05/09/2021   Pain in right hip 05/09/2021   Obesity 12/06/2020   Sleep disturbance 08/27/2020   Undifferentiated connective tissue disease (HCC) 08/17/2020   Bilateral hand pain 08/17/2020   Fibromyalgia 05/10/2020   Facial rash 04/26/2020   Elevated sedimentation rate 03/30/2020   Mixed hyperlipidemia 11/03/2019   Polyarthralgia 11/03/2019   Vitamin D deficiency 09/09/2019   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   GAD (generalized anxiety disorder) 09/11/2018   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   Allergic reactions 04/06/2015   Chronic rhinitis 04/06/2015   Elevated blood pressure reading without diagnosis of hypertension 04/06/2015   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   Hot flashes  due to surgical menopause 01/26/2015   S/P hysterectomy 06/17/2014   Pelvic pain in female 01/23/2014   Contraception management 03/31/2013   AKI (acute kidney injury) (HCC) 01/04/2013   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   Hydronephrosis, right 12/30/2012   Pleuritic chest pain 12/30/2012   Pneumonia 12/30/2012   Primary dysmenorrhea 03/07/2012    Past Medical History:  Diagnosis Date   Anemia    Anxiety    from OCD   Arthritis    knee, ankle, left wrist   Chronic pain    muscle spasms- entire body, pinch nerve in upper back   Depression    doing fine now   Fibromyalgia    Per  patient   Fracture, ankle    left- x2 - resolved   Headache(784.0)    otc med prn   Heartburn    zantac prn   [facility]2012   [facility]   Hydronephrosis    History   Infection    UTI -Resolved - History only   OCD (obsessive compulsive disorder)    Polyarthralgia    Per patient   PONV (postoperative nausea and vomiting)    Post traumatic stress disorder (PTSD)    SVD (spontaneous vaginal delivery) 12/2012   [facility]   Tachycardia    Per patient    Family History  Problem Relation Age of Onset   Heart disease Mother    Breast cancer Mother    Lupus Mother    Sjogren's syndrome Mother    Hypertension Father    Cancer Paternal Grandmother        lung   Heart disease Paternal Grandmother    Breast cancer Maternal Aunt    Breast cancer Maternal Grandmother    Raynaud syndrome Sister    Lupus Sister    Raynaud syndrome Sister    Past Surgical History:  Procedure Laterality Date   CYSTOSCOPY WITH STENT PLACEMENT Right 12/25/2012   Procedure: CYSTOSCOPY WITH STENT PLACEMENT;  Surgeon: Marcine MatarStephen Dahlstedt, MD;  Location: [facility];  Service: Urology;  Laterality: Right;   LAPAROSCOPIC ASSISTED VAGINAL HYSTERECTOMY N/A 06/17/2014   Procedure: LAPAROSCOPIC ASSISTED VAGINAL HYSTERECTOMY;  Surgeon: Lavina Hammanodd Meisinger, MD;  Location: [facility];  Service: Gynecology;  Laterality: N/A;   LAPAROSCOPIC BILATERAL SALPINGECTOMY Bilateral 03/31/2013   Procedure: LAPAROSCOPIC BILATERAL SALPINGECTOMY;  Surgeon: Lavina Hammanodd Meisinger, MD;  Location: [facility];  Service: Gynecology;  Laterality: Bilateral;   LAPAROSCOPY N/A 01/23/2014   Procedure: LAPAROSCOPY DIAGNOSTIC RIGHT PERITONEAL BIOPSY;  Surgeon: Lavina Hammanodd Meisinger, MD;  Location: [facility];  Service: Gynecology;  Laterality: N/A;   nephrostomy tube placement at 25wks and removed at 335/7 Right    OOPHORECTOMY Bilateral 06/17/2014   Procedure: OOPHORECTOMY;  Surgeon: Lavina Hammanodd Meisinger, MD;  Location: [facility];  Service:  Gynecology;  Laterality: Bilateral;   TUBAL LIGATION     VIDEO ASSISTED THORACOSCOPY (VATS)/DECORTICATION Right 01/08/2013   Procedure: VIDEO ASSISTED THORACOSCOPY DECORTICATION; Drainage of Pleural Effusion;  Surgeon: Loreli SlotSteven C Hendrickson, MD;  [facility];  Service: Thoracic;  Laterality: Right;   WISDOM TOOTH EXTRACTION  06/2013   Social History   Social History Narrative   Not on file   Immunization History  Administered Date(s) Administered   HPV Quadrivalent 05/09/2007, 09/02/2007   Influenza Split 02/24/2009   PFIZER Comirnaty(Gray Top)Covid-19 Tri-Sucrose Vaccine 08/27/2020   PFIZER(Purple Top)SARS-COV-2 Vaccination 07/10/2019, 08/04/2019   Tdap 10/14/2008, 11/07/2012     Objective: Vital Signs: LMP 01/04/2014  Physical Exam   Musculoskeletal Exam: ***  CDAI Exam: CDAI Score: -- Patient Global: --; Provider Global: -- Swollen: --; Tender: -- Joint Exam 09/14/2021   No joint exam has been documented for this visit   There is currently no information documented on the homunculus. Go to the Rheumatology activity and complete the homunculus joint exam.  Investigation: No additional findings.  Imaging: No results found.  Recent Labs: Lab Results  Component Value Date   WBC 9.1 06/08/2021   HGB 12.9 06/08/2021   PLT 323 06/08/2021   NA 142 06/07/2020   K 4.9 06/07/2020   CL 107 06/07/2020   CO2 30 06/07/2020   GLUCOSE 88 06/07/2020   BUN 11 06/07/2020   CREATININE 0.82 06/07/2020   BILITOT 0.3 06/07/2020   ALKPHOS 91 11/07/2016   AST 16 06/07/2020   ALT 20 06/07/2020   PROT 6.8 06/07/2020   ALBUMIN 4.5 11/07/2016   CALCIUM 9.5 06/07/2020   GFRAA 111 06/07/2020    Speciality Comments: No specialty comments available.  Procedures:  No procedures performed Allergies: Amoxicillin-pot clavulanate, Baclofen, Cinnamon, Nickel, Other, Shellfish allergy, Duloxetine hcl, Hydroxyzine, Phenergan [promethazine hcl], and Promethazine   Assessment /  Plan:     Visit Diagnoses: No diagnosis found.  ***  Orders: No orders of the defined types were placed in this encounter.  No orders of the defined types were placed in this encounter.    Follow-Up Instructions: No follow-ups on file.   Ellen Henri, CMA  Note - This record has been created using [employer].  Chart creation errors have been sought, but may not always  have been located. Such creation errors do not reflect on  the standard of medical care.

## 2021-09-14 ENCOUNTER — Encounter: Payer: Self-pay | Admitting: Internal Medicine

## 2021-09-14 ENCOUNTER — Ambulatory Visit: Payer: Medicaid Other | Admitting: Internal Medicine

## 2021-09-14 ENCOUNTER — Ambulatory Visit (INDEPENDENT_AMBULATORY_CARE_PROVIDER_SITE_OTHER): Payer: Medicaid Other

## 2021-09-14 VITALS — BP 129/89 | HR 75 | Resp 12 | Ht 62.0 in | Wt 223.0 lb

## 2021-09-14 DIAGNOSIS — Z79899 Other long term (current) drug therapy: Secondary | ICD-10-CM | POA: Diagnosis not present

## 2021-09-14 DIAGNOSIS — M797 Fibromyalgia: Secondary | ICD-10-CM

## 2021-09-14 DIAGNOSIS — M359 Systemic involvement of connective tissue, unspecified: Secondary | ICD-10-CM

## 2021-09-14 DIAGNOSIS — M542 Cervicalgia: Secondary | ICD-10-CM

## 2021-09-14 DIAGNOSIS — M2550 Pain in unspecified joint: Secondary | ICD-10-CM | POA: Diagnosis not present

## 2021-09-14 DIAGNOSIS — R21 Rash and other nonspecific skin eruption: Secondary | ICD-10-CM

## 2021-09-14 MED ORDER — HYDROXYCHLOROQUINE SULFATE 200 MG PO TABS: ORAL_TABLET | ORAL | 1 refills | Status: DC

## 2021-12-05 ENCOUNTER — Emergency Department (HOSPITAL_BASED_OUTPATIENT_CLINIC_OR_DEPARTMENT_OTHER): Payer: Medicaid Other

## 2021-12-05 ENCOUNTER — Emergency Department (HOSPITAL_BASED_OUTPATIENT_CLINIC_OR_DEPARTMENT_OTHER)
Admission: EM | Admit: 2021-12-05 | Discharge: 2021-12-05 | Discharge status: Against Medical Advice | Payer: Medicaid Other | Attending: Emergency Medicine | Admitting: Emergency Medicine

## 2021-12-05 ENCOUNTER — Encounter (HOSPITAL_BASED_OUTPATIENT_CLINIC_OR_DEPARTMENT_OTHER): Payer: Self-pay | Admitting: Emergency Medicine

## 2021-12-05 ENCOUNTER — Other Ambulatory Visit: Payer: Self-pay

## 2021-12-05 DIAGNOSIS — R55 Syncope and collapse: Secondary | ICD-10-CM | POA: Insufficient documentation

## 2021-12-05 DIAGNOSIS — Z5321 Procedure and treatment not carried out due to patient leaving prior to being seen by health care provider: Secondary | ICD-10-CM | POA: Insufficient documentation

## 2021-12-05 DIAGNOSIS — M542 Cervicalgia: Secondary | ICD-10-CM | POA: Diagnosis not present

## 2021-12-05 DIAGNOSIS — R42 Dizziness and giddiness: Secondary | ICD-10-CM | POA: Diagnosis not present

## 2021-12-05 DIAGNOSIS — W01198A Fall on same level from slipping, tripping and stumbling with subsequent striking against other object, initial encounter: Secondary | ICD-10-CM | POA: Diagnosis not present

## 2021-12-05 LAB — CBC
HCT: 38.5 % (ref 36.0–46.0)
Hemoglobin: 12.2 g/dL (ref 12.0–15.0)
MCH: 25.8 pg — ABNORMAL LOW (ref 26.0–34.0)
MCHC: 31.7 g/dL (ref 30.0–36.0)
MCV: 81.6 fL (ref 80.0–100.0)
Platelets: 315 10*3/uL (ref 150–400)
RBC: 4.72 MIL/uL (ref 3.87–5.11)
RDW: 13.7 % (ref 11.5–15.5)
WBC: 12.2 10*3/uL — ABNORMAL HIGH (ref 4.0–10.5)
nRBC: 0 % (ref 0.0–0.2)

## 2021-12-05 LAB — BASIC METABOLIC PANEL
Anion gap: 8 (ref 5–15)
BUN: 12 mg/dL (ref 6–20)
CO2: 25 mmol/L (ref 22–32)
Calcium: 9.3 mg/dL (ref 8.9–10.3)
Chloride: 107 mmol/L (ref 98–111)
Creatinine, Ser: 0.68 mg/dL (ref 0.44–1.00)
GFR, Estimated: >60 mL/min (ref >60)
Glucose, Bld: 107 mg/dL — ABNORMAL HIGH (ref 70–99)
Potassium: 4.6 mmol/L (ref 3.5–5.1)
Sodium: 140 mmol/L (ref 135–145)

## 2021-12-05 LAB — URINALYSIS, ROUTINE W REFLEX MICROSCOPIC
Bilirubin Urine: NEGATIVE
Glucose, UA: NEGATIVE mg/dL
Hgb urine dipstick: NEGATIVE
Ketones, ur: NEGATIVE mg/dL
Nitrite: NEGATIVE
Protein, ur: NEGATIVE mg/dL
Specific Gravity, Urine: 1.02 (ref 1.005–1.030)
pH: 6.5 (ref 5.0–8.0)

## 2021-12-05 LAB — URINALYSIS, MICROSCOPIC (REFLEX)

## 2021-12-05 LAB — PREGNANCY, URINE: Preg Test, Ur: NEGATIVE

## 2021-12-05 NOTE — ED Triage Notes (Signed)
Patient presents to ED via POV from home. Reports 2 days ago she had a syncope episode where she fell backwards hitting her head and neck. Here with dizziness and neck pain. Ambulatory.

## 2022-03-19 NOTE — Progress Notes (Unsigned)
Office Visit Note  Patient: [name] [name]             Date of Birth: 11/06/1990           MRN: [id number]             PCP: Barbarann Ehlers Referring: Barbarann Ehlers Visit Date: 03/20/2022   Subjective:  No chief complaint on file.   History of Present Illness: [name] [name] is a 31 y.o. 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 Around the time of our last visit she started to feel better with an increase in physical activity including some hiking. She developed severe migraine headache associated with nausea and vomiting and went to the ED on 3/17 for this problem. She was treated with migraine cocktail but symptoms remained and eventually improved at home after about 2 more days. She was seen to have a new erythematous rash at her right hip not similar to any previous skin involvement. She was prescribed topical and systemic steroids took about 2 days of prednisone in total. She had skin punch biopsy on 3/23 this was read as somewhat nonspecific but suggestive for eczematous rash. This whole series of events somewhat resolved on its own but she continues to have a lot of generalized muscular and joint pain. She started on nortriptyline for FMS and feels her fatigue is partially improved but the neck, back, and hip pains remain an issue.   Previous HPI 06/08/2021 [name] [name] is a 31 y.o. 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 She also had increase in rash and swelling around margins of her old tattoos coming and going. The shoulder and hip pain are greatly improved with the meloxicam. She has not noticed any side effect or intolerance with the medication either. Her skin rash around the tattoos  is better but she has increased redness and irritation on her face and torso and arms. She also feels very fatigued worse than usual with this.   Previous HPI 05/09/21 [name] [name] is a 32 y.o. 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  She was doing overall okay has [name] worse since she got sick with pneumonia and required antibiotics treatment.  First she developed severe worsening of her facial rash with warm and hot sensation in the area.  She experienced worsening joint pain in the left shoulder and some worsening of her right hip although that was chronically painful.  Besides joint pain she noticed some swelling around the margins of numerous skin tattoos this was coming and going.  She felt there is increase in joint pain near areas where she was seeing the tattoo swelling.    Previous HPI: 08/17/20 [name] [name] is a 31 y.o. 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 She has [name] experiencing increased pain all over but most problematic are in her bilateral arms distal to the elbows and in her hands. This is causing increased pain overnight and when using her hands. Symptoms are severe enough  to prevent her ability to work currently with numbness and difficulty strongly gripping items. In addition she has worse fatigue constantly. She does not recall any particular event or change in situation before these increased symptoms   No Rheumatology ROS completed.   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   GAD (generalized anxiety disorder) 09/11/2018   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   Allergic reactions 04/06/2015   Chronic rhinitis 04/06/2015   Elevated blood pressure reading without diagnosis of hypertension 04/06/2015   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   Hot flashes due to surgical menopause 01/26/2015   S/P hysterectomy 06/17/2014   Pelvic pain in female 01/23/2014   Contraception management 03/31/2013   AKI (acute kidney injury) (HCC) 01/04/2013   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   Hydronephrosis, right 12/30/2012   Pleuritic chest pain 12/30/2012   Pneumonia 12/30/2012   Primary dysmenorrhea 03/07/2012    Past Medical History:  Diagnosis Date   Anemia    Anxiety    from OCD   Arthritis    knee, ankle, left wrist   Chronic pain    muscle spasms- entire body, pinch nerve in upper back   Depression    doing fine now   Fibromyalgia    Per patient   Fracture, ankle    left- x2 - resolved   Headache(784.0)    otc med prn   Heartburn    zantac prn   History of blood transfusion 12/2012   at [facility] - 1 unit transfused   Hydronephrosis    History   Infection    UTI -Resolved - History only   OCD (obsessive compulsive disorder)    Polyarthralgia    Per patient   PONV (postoperative nausea and vomiting)    Post traumatic stress disorder (PTSD)    SVD (spontaneous vaginal delivery) 12/2012   x 1 at [facility]   Tachycardia    Per patient    Family History  Problem Relation Age of Onset   Heart disease Mother    Breast cancer Mother    Lupus Mother    Sjogren's syndrome Mother    Hypertension Father    Cancer Paternal Grandmother        lung   Heart disease Paternal Grandmother    Breast cancer Maternal Aunt    Breast  cancer Maternal Grandmother    Raynaud syndrome Sister    Lupus Sister    Raynaud syndrome Sister    Past Surgical History:  Procedure Laterality Date   CYSTOSCOPY WITH STENT PLACEMENT Right 12/25/2012   Procedure: CYSTOSCOPY WITH STENT PLACEMENT;  Surgeon: Marcine Matar, MD;  Location: [facility];  Service: Urology;  Laterality: Right;   LAPAROSCOPIC ASSISTED VAGINAL HYSTERECTOMY N/A 06/17/2014   Procedure: LAPAROSCOPIC ASSISTED VAGINAL HYSTERECTOMY;  Surgeon: Lavina Hamman, MD;  Location: [facility];  Service: Gynecology;  Laterality: N/A;   LAPAROSCOPIC BILATERAL SALPINGECTOMY  Bilateral 03/31/2013   Procedure: LAPAROSCOPIC BILATERAL SALPINGECTOMY;  Surgeon: Lavina Hamman, MD;  Location: [facility];  Service: Gynecology;  Laterality: Bilateral;   LAPAROSCOPY N/A 01/23/2014   Procedure: LAPAROSCOPY DIAGNOSTIC RIGHT PERITONEAL BIOPSY;  Surgeon: Lavina Hamman, MD;  Location: [facility];  Service: Gynecology;  Laterality: N/A;   nephrostomy tube placement at 25wks and removed at 335/7 Right    OOPHORECTOMY Bilateral 06/17/2014   Procedure: OOPHORECTOMY;  Surgeon: Lavina Hamman, MD;  Location: [facility];  Service: Gynecology;  Laterality: Bilateral;   TUBAL LIGATION     VIDEO ASSISTED THORACOSCOPY (VATS)/DECORTICATION Right 01/08/2013   Procedure: VIDEO ASSISTED THORACOSCOPY DECORTICATION; Drainage of Pleural Effusion;  Surgeon: Loreli Slot, MD;  Location: [facility] OR;  Service: Thoracic;  Laterality: Right;   WISDOM TOOTH EXTRACTION  06/2013   Social History   Social History Narrative   Not on file   Immunization History  Administered Date(s) Administered   HPV Quadrivalent 05/09/2007, 09/02/2007   Influenza Split 02/24/2009   PFIZER Comirnaty(Gray Top)Covid-19 Tri-Sucrose Vaccine 08/27/2020   PFIZER(Purple Top)SARS-COV-2 Vaccination 07/10/2019, 08/04/2019   Tdap 10/14/2008, 11/07/2012     Objective: Vital Signs: LMP 01/04/2014    Physical Exam   Musculoskeletal Exam: ***  CDAI Exam: CDAI  Score: -- Patient Global: --; Provider Global: -- Swollen: --; Tender: -- Joint Exam 03/20/2022   No joint exam has [name] documented for this visit   There is currently no information documented on the homunculus. Go to the Rheumatology activity and complete the homunculus joint exam.  Investigation: No additional findings.  Imaging: No results found.  Recent Labs: Lab Results  Component Value Date   WBC 12.2 (H) 12/05/2021   HGB 12.2 12/05/2021   PLT 315 12/05/2021   NA 140 12/05/2021   K 4.6 12/05/2021   CL 107 12/05/2021   CO2 25 12/05/2021   GLUCOSE 107 (H) 12/05/2021   BUN 12 12/05/2021   CREATININE 0.68 12/05/2021   BILITOT 0.3 06/07/2020   ALKPHOS 91 11/07/2016   AST 16 06/07/2020   ALT 20 06/07/2020   PROT 6.8 06/07/2020   ALBUMIN 4.5 11/07/2016   CALCIUM 9.3 12/05/2021   GFRAA 111 06/07/2020    Speciality Comments: PLQ Eye Exam 10/06/2020 Normal [facility] f/u 1 year 10/06/2021 appt  Procedures:  No procedures performed Allergies: Amoxicillin-pot clavulanate, Baclofen, Cinnamon, Nickel, Other, Shellfish allergy, Duloxetine hcl, Gabapentin, Hydroxyzine, Metformin and related, Prednisone, Zoloft [sertraline], Phenergan [promethazine hcl], and Promethazine   Assessment / Plan:     Visit Diagnoses: No diagnosis found.  ***  Orders: No orders of the defined types were placed in this encounter.  No orders of the defined types were placed in this encounter.    Follow-Up Instructions: No follow-ups on file.   Fuller Plan, MD  Note - This record has [name] created using [employer].  Chart creation errors have [name] sought, but may not always  have [name] located. Such creation errors do not reflect on  the standard of medical care.

## 2022-03-20 ENCOUNTER — Encounter: Payer: Self-pay | Admitting: Internal Medicine

## 2022-03-20 ENCOUNTER — Ambulatory Visit: Payer: Medicaid Other | Attending: Internal Medicine | Admitting: Internal Medicine

## 2022-03-20 VITALS — BP 129/85 | HR 85 | Resp 15 | Ht 62.0 in | Wt 245.0 lb

## 2022-03-20 DIAGNOSIS — M797 Fibromyalgia: Secondary | ICD-10-CM

## 2022-03-20 DIAGNOSIS — M359 Systemic involvement of connective tissue, unspecified: Secondary | ICD-10-CM | POA: Diagnosis not present

## 2022-03-20 DIAGNOSIS — M2550 Pain in unspecified joint: Secondary | ICD-10-CM

## 2022-03-20 DIAGNOSIS — R0781 Pleurodynia: Secondary | ICD-10-CM

## 2022-03-20 DIAGNOSIS — M357 Hypermobility syndrome: Secondary | ICD-10-CM

## 2022-03-21 LAB — CBC WITH DIFFERENTIAL/PLATELET
Absolute Monocytes: 486 cells/uL (ref 200–950)
Basophils Absolute: 57 cells/uL (ref 0–200)
Basophils Relative: 0.7 %
Eosinophils Absolute: 243 cells/uL (ref 15–500)
Eosinophils Relative: 3 %
HCT: 35.2 % (ref 35.0–45.0)
Hemoglobin: 11.4 g/dL — ABNORMAL LOW (ref 11.7–15.5)
Lymphs Abs: 2503 cells/uL (ref 850–3900)
MCH: 26.2 pg — ABNORMAL LOW (ref 27.0–33.0)
MCHC: 32.4 g/dL (ref 32.0–36.0)
MCV: 80.9 fL (ref 80.0–100.0)
MPV: 10.4 fL (ref 7.5–12.5)
Monocytes Relative: 6 %
Neutro Abs: 4811 cells/uL (ref 1500–7800)
Neutrophils Relative %: 59.4 %
Platelets: 264 10*3/uL (ref 140–400)
RBC: 4.35 10*6/uL (ref 3.80–5.10)
RDW: 14.2 % (ref 11.0–15.0)
Total Lymphocyte: 30.9 %
WBC: 8.1 10*3/uL (ref 3.8–10.8)

## 2022-03-21 LAB — C3 AND C4
C3 Complement: 205 mg/dL — ABNORMAL HIGH (ref 83–193)
C4 Complement: 36 mg/dL (ref 15–57)

## 2022-03-21 LAB — COMPLETE METABOLIC PANEL WITH GFR
AG Ratio: 1.4 (calc) (ref 1.0–2.5)
ALT: 21 U/L (ref 6–29)
AST: 14 U/L (ref 10–30)
Albumin: 4 g/dL (ref 3.6–5.1)
Alkaline phosphatase (APISO): 96 U/L (ref 31–125)
BUN: 10 mg/dL (ref 7–25)
CO2: 28 mmol/L (ref 20–32)
Calcium: 9.2 mg/dL (ref 8.6–10.2)
Chloride: 105 mmol/L (ref 98–110)
Creat: 0.72 mg/dL (ref 0.50–0.97)
Globulin: 2.8 g/dL (calc) (ref 1.9–3.7)
Glucose, Bld: 95 mg/dL (ref 65–99)
Potassium: 4.5 mmol/L (ref 3.5–5.3)
Sodium: 141 mmol/L (ref 135–146)
Total Bilirubin: 0.2 mg/dL (ref 0.2–1.2)
Total Protein: 6.8 g/dL (ref 6.1–8.1)
eGFR: 114 mL/min/{1.73_m2} (ref >=60)

## 2022-03-21 LAB — SEDIMENTATION RATE: Sed Rate: 50 mm/h — ABNORMAL HIGH (ref 0–20)

## 2022-07-26 HISTORY — PX: OTHER SURGICAL HISTORY: SHX169

## 2022-09-19 ENCOUNTER — Ambulatory Visit: Payer: Medicaid Other | Attending: Internal Medicine | Admitting: Internal Medicine

## 2022-09-19 ENCOUNTER — Encounter: Payer: Self-pay | Admitting: Internal Medicine

## 2022-09-19 VITALS — BP 109/74 | HR 74 | Resp 14 | Ht 62.0 in | Wt 209.0 lb

## 2022-09-19 DIAGNOSIS — Z9884 Bariatric surgery status: Secondary | ICD-10-CM | POA: Diagnosis not present

## 2022-09-19 DIAGNOSIS — M797 Fibromyalgia: Secondary | ICD-10-CM | POA: Diagnosis not present

## 2022-09-19 DIAGNOSIS — M359 Systemic involvement of connective tissue, unspecified: Secondary | ICD-10-CM

## 2022-09-19 DIAGNOSIS — M357 Hypermobility syndrome: Secondary | ICD-10-CM

## 2022-09-19 DIAGNOSIS — R21 Rash and other nonspecific skin eruption: Secondary | ICD-10-CM

## 2022-09-19 DIAGNOSIS — M2550 Pain in unspecified joint: Secondary | ICD-10-CM

## 2022-09-19 MED ORDER — HYDROXYCHLOROQUINE SULFATE 200 MG PO TABS: ORAL_TABLET | ORAL | 1 refills | Status: DC

## 2022-09-19 NOTE — Progress Notes (Signed)
Office Visit Note  Patient: [name] [name]             Date of Birth: 06-Nov-1990           MRN: [id number]             PCP: Barbarann Ehlers Referring: Jordan Hawks, PA-C Visit Date: 09/19/2022   Subjective:  Follow-up   History of Present Illness: [name] [name] is a 32 y.o. 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  She has remained off the medication since that time.  Has some associated constipation after the surgery no diarrhea issues.  Has persistent body aches widespread not sure which parts specifically worse related to the medication change.  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  Prior to her surgery had multiple upper respiratory infection with antibiotic treatment for apparently strep throat twice.  Previous HPI 03/20/22 [name] [name] is a 32 y.o. 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  Still on the nortriptyline 10 mg and the tizanidine 4 mg as needed for fibromyalgia and myofascial pain.  Overall she has been doing fairly well did have a flareup of increase symptoms around July and August.  During this time with stiffness joint pain and a lot more skin rashes.  She had a skin biopsy with dermatology findings consistent with eczema there is also some concern for an inflammatory dermatitis somewhat nonspecific but thought likely related to connective tissue disease.  Looks like this is treated with addition of topical steroid not on new long-term systemic medication.  She is working with physical therapy they have expressed concern for joint hypermobility  particularly involving her bilateral knees.  Discussed this being a possible contributor to her ongoing joint pain and stiffness and recommended discussion at our follow-up.  She is seeing bariatric clinic currently working with dietitian trying to work on her exercise and diet in anticipation of bariatric surgery next year.  Currently remains very limited in the exercise component because of worsened pain after exertion.  Likely aiming for bariatric surgery in January February next year.   Previous HPI 09/14/21 [name] [name] is a 32 y.o. 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 Around the time of our last visit she started to feel better with an increase in physical activity including some hiking. She developed severe migraine headache associated with nausea and vomiting and went to the ED on 3/17 for this problem. She was treated with migraine cocktail but symptoms remained and eventually improved at home after about 2 more days. She was seen to have a new erythematous rash at her right hip not similar to any previous skin involvement. She was prescribed topical and systemic steroids took about 2 days of prednisone in total. She had skin punch biopsy on 3/23 this was read as somewhat nonspecific but suggestive for eczematous rash. This whole series of events somewhat resolved on its own but she continues to have a lot of generalized muscular and joint pain. She started on nortriptyline for FMS and feels her fatigue is partially improved but the neck, back,  and hip pains remain an issue.   Previous HPI 06/08/2021 [name] [name] is a 32 y.o. 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 She also had increase in rash and swelling around margins of her old tattoos coming and going. The shoulder and hip pain are greatly improved with the meloxicam.  She has not noticed any side effect or intolerance with the medication either. Her skin rash around the tattoos is better but she has increased redness and irritation on her face and torso and arms. She also feels very fatigued worse than usual with this.   Previous HPI 05/09/21 [name] [name] is a 32 y.o. 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  She was doing overall okay has been worse since she got sick with pneumonia and required antibiotics treatment.  First she developed severe worsening of her facial rash with warm and hot sensation in the area.  She experienced worsening joint pain in the left shoulder and some worsening of her right hip although that was chronically painful.  Besides joint pain she noticed some swelling around the margins of numerous skin tattoos this was coming and going.  She felt there is increase in joint pain near areas where she was seeing the tattoo swelling.    Previous HPI: 08/17/20 [name] [name] is a 32 y.o. 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 She has been experiencing increased pain all over but most problematic are in her bilateral arms distal to the elbows and in her hands. This is causing increased pain overnight and when using her hands. Symptoms are severe enough to prevent her ability to work currently with numbness and difficulty strongly gripping items. In addition she has worse fatigue constantly. She does not recall any particular event or change in situation before these increased symptoms   Review of Systems  Constitutional:  Positive for fatigue.  HENT:  Positive for mouth sores and mouth dryness.   Eyes:  Positive for dryness.  Respiratory:  Negative for shortness of breath.   Cardiovascular:  Positive for chest pain and palpitations.  Gastrointestinal:  Negative for blood in stool, constipation and diarrhea.  Endocrine:  Negative for increased urination.  Genitourinary:  Negative for involuntary urination.  Musculoskeletal:  Positive for joint pain, gait problem, joint pain, joint swelling, myalgias, muscle weakness, morning stiffness, muscle tenderness and myalgias.  Skin:  Positive for rash and sensitivity to sunlight. Negative for color change and hair loss.  Allergic/Immunologic: Positive for susceptible to infections.  Neurological:  Positive for dizziness and headaches.  Hematological:  Negative for swollen glands.  Psychiatric/Behavioral:  Positive for depressed mood. Negative for sleep disturbance. The patient is nervous/anxious.     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   Neck pain 09/14/2021   Pain in left shoulder 05/09/2021   Pain in right hip 05/09/2021   Chronic eczematous otitis externa of both ears 12/13/2020   Obesity 12/06/2020   Sleep disturbance 08/27/2020   Undifferentiated connective tissue disease (HCC) 08/17/2020   Bilateral hand pain 08/17/2020   Fibromyalgia 05/10/2020   Facial rash 04/26/2020   Elevated sedimentation rate  03/30/2020   Mixed hyperlipidemia 11/03/2019   Polyarthralgia 11/03/2019   Vitamin D deficiency 09/09/2019   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   GAD (generalized anxiety disorder) 09/11/2018   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   Allergic reactions 04/06/2015   Chronic rhinitis 04/06/2015   Elevated blood pressure reading without diagnosis of hypertension 04/06/2015   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   Hot flashes due to surgical menopause 01/26/2015   S/P hysterectomy 06/17/2014   Pelvic pain in female 01/23/2014   Contraception management 03/31/2013   AKI (acute kidney injury) (HCC) 01/04/2013   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   Hydronephrosis, right 12/30/2012   Pleuritic  chest pain 12/30/2012   Pneumonia 12/30/2012   Primary dysmenorrhea 03/07/2012    Past Medical History:  Diagnosis Date   Anemia    Anxiety    from OCD   Arthritis    knee, ankle, left wrist   Chronic pain    muscle spasms- entire body, pinch nerve in upper back   Depression    doing fine now   Fibromyalgia    Per patient   Fracture, ankle    left- x2 - resolved   Headache(784.0)    otc med prn   Heartburn    zantac prn   History of blood transfusion 12/2012   at [facility] - 1 unit transfused   Hydronephrosis    History   Infection    UTI -Resolved - History only   OCD (obsessive compulsive disorder)    Polyarthralgia    Per patient   PONV (postoperative nausea and vomiting)    Post concussive syndrome    Post traumatic stress disorder (PTSD)    SVD (spontaneous vaginal delivery) 12/2012   x 1 at [facility]   Tachycardia    Per patient    Family History  Problem Relation Age of Onset   Heart disease Mother    Breast cancer Mother    Lupus Mother    Sjogren's syndrome Mother    Hypertension Father    Cancer Paternal Grandmother        lung   Heart disease Paternal Grandmother    Breast cancer Maternal Aunt    Breast cancer Maternal Grandmother    Raynaud syndrome Sister    Lupus Sister    Raynaud syndrome Sister    Past Surgical History:  Procedure Laterality Date   CYSTOSCOPY WITH STENT PLACEMENT Right 12/25/2012   Procedure: CYSTOSCOPY WITH STENT PLACEMENT;  Surgeon: Marcine Matar, MD;  Location: [facility];  Service: Urology;  Laterality: Right;   LAPAROSCOPIC ASSISTED VAGINAL HYSTERECTOMY N/A 06/17/2014   Procedure: LAPAROSCOPIC ASSISTED VAGINAL HYSTERECTOMY;  Surgeon: Lavina Hamman, MD;  Location: [facility];  Service: Gynecology;  Laterality: N/A;   LAPAROSCOPIC BILATERAL SALPINGECTOMY Bilateral 03/31/2013   Procedure: LAPAROSCOPIC BILATERAL SALPINGECTOMY;  Surgeon: Lavina Hamman, MD;  Location: [facility];  Service: Gynecology;  Laterality: Bilateral;    LAPAROSCOPY N/A 01/23/2014   Procedure: LAPAROSCOPY DIAGNOSTIC RIGHT PERITONEAL BIOPSY;  Surgeon: Lavina Hamman, MD;  Location: [facility];  Service: Gynecology;  Laterality: N/A;   nephrostomy tube placement at 25wks and removed at 335/7 Right    OOPHORECTOMY Bilateral 06/17/2014   Procedure: OOPHORECTOMY;  Surgeon: Lavina Hamman, MD;  Location: [facility];  Service: Gynecology;  Laterality: Bilateral;   OTHER SURGICAL HISTORY  07/26/2022   Gastric Sleeve   TUBAL LIGATION     VIDEO ASSISTED THORACOSCOPY (VATS)/DECORTICATION Right 01/08/2013   Procedure:  VIDEO ASSISTED THORACOSCOPY DECORTICATION; Drainage of Pleural Effusion;  Surgeon: Loreli Slot, MD;  Location: [facility];  Service: Thoracic;  Laterality: Right;   WISDOM TOOTH EXTRACTION  06/15/2013   Social History   Social History Narrative   Not on file   Immunization History  Administered Date(s) Administered   HPV Quadrivalent 05/09/2007, 09/02/2007   Influenza Split 02/24/2009   PFIZER Comirnaty(Gray Top)Covid-19 Tri-Sucrose Vaccine 08/27/2020   PFIZER(Purple Top)SARS-COV-2 Vaccination 07/10/2019, 08/04/2019   Tdap 10/14/2008, 11/07/2012     Objective: Vital Signs: BP 109/74 (BP Location: Left Arm, Patient Position: Sitting, Cuff Size: Normal)   Pulse 74   Resp 14   Ht 5\' 2"  (1.575 m)   Wt 209 lb (94.8 kg)   LMP 01/04/2014   BMI 38.23 kg/m    Physical Exam Constitutional:      Appearance: She is obese.  Eyes:     Conjunctiva/sclera: Conjunctivae normal.  Cardiovascular:     Rate and Rhythm: Normal rate and regular rhythm.  Pulmonary:     Effort: Pulmonary effort is normal.     Breath sounds: Normal breath sounds.  Musculoskeletal:     Right lower leg: No edema.     Left lower leg: No edema.  Lymphadenopathy:     Cervical: No cervical adenopathy.  Skin:    General: Skin is warm and dry.     Findings: No rash.  Neurological:     Mental Status: She is alert.  Psychiatric:        Mood and Affect: Mood  normal.      Musculoskeletal Exam:  Tenderness to pressure on lateral side of both shoulders and throughout fingers and wrist joints, no palpable synovitis, range of motion is normal Right knee with mild anterior joint line tenderness to pressure worst around patellar tendon, no palpable effusions Tenderness to pressure over the right ankle more anterior and lateral, no pain with active and passive range of motion   Investigation: No additional findings.  Imaging: No results found.  Recent Labs: Lab Results  Component Value Date   WBC 8.1 03/20/2022   HGB 11.4 (L) 03/20/2022   PLT 264 03/20/2022   NA 141 03/20/2022   K 4.5 03/20/2022   CL 105 03/20/2022   CO2 28 03/20/2022   GLUCOSE 95 03/20/2022   BUN 10 03/20/2022   CREATININE 0.72 03/20/2022   BILITOT 0.2 03/20/2022   ALKPHOS 91 11/07/2016   AST 14 03/20/2022   ALT 21 03/20/2022   PROT 6.8 03/20/2022   ALBUMIN 4.5 11/07/2016   CALCIUM 9.2 03/20/2022   GFRAA 111 06/07/2020    Speciality Comments: [facility] Eye Exam 10/06/2020 Normal [facility] f/u 1 year 10/06/2021 appt  Procedures:  No procedures performed Allergies: Amoxicillin-pot clavulanate, Baclofen, Cinnamon, Nickel, Other, Shellfish allergy, Duloxetine hcl, Gabapentin, Hydroxyzine, Metformin and related, Prednisone, Zoloft [sertraline], Phenergan [promethazine hcl], and Promethazine   Assessment / Plan:     Visit Diagnoses: Undifferentiated connective tissue disease (HCC) - Plan: Sedimentation rate, C-reactive protein, C3 and C4  Feels symptoms are somewhat worse off hydroxychloroquine.  Will recheck sed rate CRP and serum complements previously have correlated with increase in disease activity.  Inflammation looks worse than at her previous more controlled level but better than late last year. Rashes remain more frequent off treatment.  Suspect some amount of inflammation improvement with the weight loss as well.  Recommend resuming hydroxychloroquine 400 mg  daily.  Fibromyalgia Benign joint hypermobility  Having some exacerbation and fibromyalgia symptoms some may  be related to off her maintenance anti-inflammatory drug. IBS symptoms not horribly worse s/p bypass.  S/P Bariatric surgery  Making great progress weight is down from 245 pounds at our last visit in December to 209 pounds today.  Facial rash  Has been active recently.  No particular new lesions.  She is having a lot of itching but not particularly limited to rash distribution and without increased visible skin changes.   Orders: Orders Placed This Encounter  Procedures   Sedimentation rate   C-reactive protein   C3 and C4   Meds ordered this encounter  Medications   hydroxychloroquine (PLAQUENIL) 200 MG tablet    Sig: TAKE 2 TABLETS(400 MG) BY MOUTH DAILY    Dispense:  180 tablet    Refill:  1     Follow-Up Instructions: Return in about 6 months (around 03/21/2023) for UCTD/FMS on HCQ f/u 6mos.   Fuller Plan, MD  Note - This record has been created using [employer].  Chart creation errors have been sought, but may not always  have been located. Such creation errors do not reflect on  the standard of medical care.

## 2022-09-20 LAB — C-REACTIVE PROTEIN: CRP: 6.7 mg/L (ref <8.0)

## 2022-09-20 LAB — SEDIMENTATION RATE: Sed Rate: 36 mm/h — ABNORMAL HIGH (ref 0–20)

## 2022-09-20 LAB — C3 AND C4
C3 Complement: 183 mg/dL (ref 83–193)
C4 Complement: 34 mg/dL (ref 15–57)

## 2022-09-20 NOTE — Progress Notes (Signed)
Sed rate 36 is improved from 50. Complement C3 is 183 improved from 205. These are reassuring. I agree with resuming hydroxychloroquine as planned.

## 2023-01-09 NOTE — Progress Notes (Deleted)
Office Visit Note  Patient: [name] [name]             Date of Birth: Apr 16, 1991           MRN: [id number]             PCP: Barbarann Ehlers Referring: Barbarann Ehlers Visit Date: 01/10/2023   Subjective:  No chief complaint on file.   History of Present Illness: [name] [name] is a 32 y.o. female here for follow up ***   Previous HPI 09/19/22 [name] [name] is a 32 y.o. 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  She has remained off the medication since that time.  Has some associated constipation after the surgery no diarrhea issues.  Has persistent body aches widespread not sure which parts specifically worse related to the medication change.  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  Prior to her surgery had multiple upper respiratory infection with antibiotic treatment for apparently strep throat twice.   Previous HPI 03/20/22 [name] [name] is a 32 y.o. 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  Still on the nortriptyline 10 mg and the tizanidine 4 mg as needed for fibromyalgia and myofascial pain.  Overall she has been doing fairly well did have a flareup of increase symptoms around July and August.  During this time with stiffness joint pain and a lot more skin rashes.  She had a skin biopsy with dermatology findings consistent with eczema there is also some concern for an inflammatory dermatitis somewhat nonspecific but thought likely related to connective tissue disease.  Looks like this is treated with addition of topical steroid not on new long-term systemic  medication.  She is working with physical therapy they have expressed concern for joint hypermobility particularly involving her bilateral knees.  Discussed this being a possible contributor to her ongoing joint pain and stiffness and recommended discussion at our follow-up.  She is seeing bariatric clinic currently working with dietitian trying to work on her exercise and diet in anticipation of bariatric surgery next year.  Currently remains very limited in the exercise component because of worsened pain after exertion.  Likely aiming for bariatric surgery in January February next year.   Previous HPI 09/14/21 [name] [name] is a 32 y.o. 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 Around the time of our last visit she started to feel better with an increase in physical activity including some hiking. She developed severe migraine headache associated with nausea and vomiting and went to the ED on 3/17 for this problem. She was treated with migraine cocktail but symptoms remained and eventually improved at home after about 2 more days. She was seen to have a new erythematous rash at her right hip not similar to any previous skin involvement. She was prescribed topical and systemic steroids took about 2 days of prednisone in total. She had skin punch biopsy on 3/23 this was read as somewhat nonspecific but suggestive for eczematous rash. This whole series of events somewhat resolved on its own but she continues to have a lot  of generalized muscular and joint pain. She started on nortriptyline for FMS and feels her fatigue is partially improved but the neck, back, and hip pains remain an issue.   Previous HPI 06/08/2021 [name] [name] is a 32 y.o. 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 She also had increase in rash and swelling around margins of  her old tattoos coming and going. The shoulder and hip pain are greatly improved with the meloxicam. She has not noticed any side effect or intolerance with the medication either. Her skin rash around the tattoos is better but she has increased redness and irritation on her face and torso and arms. She also feels very fatigued worse than usual with this.   Previous HPI 05/09/21 [name] [name] is a 32 y.o. 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  She was doing overall okay has been worse since she got sick with pneumonia and required antibiotics treatment.  First she developed severe worsening of her facial rash with warm and hot sensation in the area.  She experienced worsening joint pain in the left shoulder and some worsening of her right hip although that was chronically painful.  Besides joint pain she noticed some swelling around the margins of numerous skin tattoos this was coming and going.  She felt there is increase in joint pain near areas where she was seeing the tattoo swelling.    Previous HPI: 08/17/20 [name] [name] is a 32 y.o. 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 She has been experiencing increased pain all over but most problematic are in her bilateral arms distal to the elbows and in her hands. This is causing increased pain overnight and when using her hands. Symptoms are severe enough to prevent her ability to work currently with numbness and difficulty strongly gripping items. In addition she has worse fatigue constantly. She does not recall any particular event or change in situation before these increased symptoms   No Rheumatology ROS completed.   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   Neck pain 09/14/2021   Pain in left shoulder 05/09/2021   Pain in  right hip 05/09/2021   Chronic eczematous otitis externa of both ears 12/13/2020   Obesity 12/06/2020   Sleep disturbance 08/27/2020   Undifferentiated connective tissue disease (HCC) 08/17/2020   Bilateral hand pain 08/17/2020   Fibromyalgia 05/10/2020   Facial rash 04/26/2020   Elevated sedimentation rate 03/30/2020   Mixed hyperlipidemia 11/03/2019   Polyarthralgia 11/03/2019   Vitamin D deficiency 09/09/2019   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   GAD (generalized anxiety disorder) 09/11/2018   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   Allergic reactions 04/06/2015   Chronic rhinitis 04/06/2015   Elevated blood pressure reading without diagnosis of hypertension 04/06/2015   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   Hot flashes due to surgical menopause 01/26/2015   S/P hysterectomy 06/17/2014   Pelvic pain in female 01/23/2014   Contraception management 03/31/2013   AKI (acute kidney injury) (HCC) 01/04/2013   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   Hydronephrosis, right 12/30/2012  Pleuritic chest pain 12/30/2012   Pneumonia 12/30/2012   Primary dysmenorrhea 03/07/2012    Past Medical History:  Diagnosis Date   Anemia    Anxiety    from OCD   Arthritis    knee, ankle, left wrist   Chronic pain    muscle spasms- entire body, pinch nerve in upper back   Depression    doing fine now   Fibromyalgia    Per patient   Fracture, ankle    left- x2 - resolved   Headache(784.0)    otc med prn   Heartburn    zantac prn   History of blood transfusion 12/2012   at [facility] - 1 unit transfused   Hydronephrosis    History   Infection    UTI -Resolved - History only   OCD (obsessive compulsive disorder)    Polyarthralgia    Per patient   PONV (postoperative nausea and vomiting)    Post concussive syndrome    Post traumatic stress disorder (PTSD)    SVD (spontaneous vaginal delivery) 12/2012   x 1 at [facility]    Tachycardia    Per patient    Family History  Problem Relation Age of Onset   Heart disease Mother    Breast cancer Mother    Lupus Mother    Sjogren's syndrome Mother    Hypertension Father    Cancer Paternal Grandmother        lung   Heart disease Paternal Grandmother    Breast cancer Maternal Aunt    Breast cancer Maternal Grandmother    Raynaud syndrome Sister    Lupus Sister    Raynaud syndrome Sister    Past Surgical History:  Procedure Laterality Date   CYSTOSCOPY WITH STENT PLACEMENT Right 12/25/2012   Procedure: CYSTOSCOPY WITH STENT PLACEMENT;  Surgeon: Marcine Matar, MD;  Location: [facility];  Service: Urology;  Laterality: Right;   LAPAROSCOPIC ASSISTED VAGINAL HYSTERECTOMY N/A 06/17/2014   Procedure: LAPAROSCOPIC ASSISTED VAGINAL HYSTERECTOMY;  Surgeon: Lavina Hamman, MD;  Location: [facility];  Service: Gynecology;  Laterality: N/A;   LAPAROSCOPIC BILATERAL SALPINGECTOMY Bilateral 03/31/2013   Procedure: LAPAROSCOPIC BILATERAL SALPINGECTOMY;  Surgeon: Lavina Hamman, MD;  Location: [facility];  Service: Gynecology;  Laterality: Bilateral;   LAPAROSCOPY N/A 01/23/2014   Procedure: LAPAROSCOPY DIAGNOSTIC RIGHT PERITONEAL BIOPSY;  Surgeon: Lavina Hamman, MD;  Location: [facility];  Service: Gynecology;  Laterality: N/A;   nephrostomy tube placement at 25wks and removed at 335/7 Right    OOPHORECTOMY Bilateral 06/17/2014   Procedure: OOPHORECTOMY;  Surgeon: Lavina Hamman, MD;  Location: [facility];  Service: Gynecology;  Laterality: Bilateral;   OTHER SURGICAL HISTORY  07/26/2022   Gastric Sleeve   TUBAL LIGATION     VIDEO ASSISTED THORACOSCOPY (VATS)/DECORTICATION Right 01/08/2013   Procedure: VIDEO ASSISTED THORACOSCOPY DECORTICATION; Drainage of Pleural Effusion;  Surgeon: Loreli Slot, MD;  Location: [facility];  Service: Thoracic;  Laterality: Right;   WISDOM TOOTH EXTRACTION  06/15/2013   Social History   Social History Narrative   Not on file   Immunization History   Administered Date(s) Administered   HPV Quadrivalent 05/09/2007, 09/02/2007   Influenza Split 02/24/2009   PFIZER Comirnaty(Gray Top)Covid-19 Tri-Sucrose Vaccine 08/27/2020   PFIZER(Purple Top)SARS-COV-2 Vaccination 07/10/2019, 08/04/2019   Tdap 10/14/2008, 11/07/2012     Objective: Vital Signs: LMP 01/04/2014    Physical Exam   Musculoskeletal Exam: ***  CDAI Exam: CDAI Score: -- Patient Global: --; Provider Global: -- Swollen: --; Tender: -- Joint Exam  01/10/2023   No joint exam has been documented for this visit   There is currently no information documented on the homunculus. Go to the Rheumatology activity and complete the homunculus joint exam.  Investigation: No additional findings.  Imaging: No results found.  Recent Labs: Lab Results  Component Value Date   WBC 8.1 03/20/2022   HGB 11.4 (L) 03/20/2022   PLT 264 03/20/2022   NA 141 03/20/2022   K 4.5 03/20/2022   CL 105 03/20/2022   CO2 28 03/20/2022   GLUCOSE 95 03/20/2022   BUN 10 03/20/2022   CREATININE 0.72 03/20/2022   BILITOT 0.2 03/20/2022   ALKPHOS 91 11/07/2016   AST 14 03/20/2022   ALT 21 03/20/2022   PROT 6.8 03/20/2022   ALBUMIN 4.5 11/07/2016   CALCIUM 9.2 03/20/2022   GFRAA 111 06/07/2020    Speciality Comments: PLQ Eye Exam 10/06/2020 Normal [facility] f/u 1 year 10/06/2021 appt  Procedures:  No procedures performed Allergies: Amoxicillin-pot clavulanate, Baclofen, Cinnamon, Nickel, Other, Shellfish allergy, Duloxetine hcl, Gabapentin, Hydroxyzine, Metformin and related, Prednisone, Zoloft [sertraline], Phenergan [promethazine hcl], and Promethazine   Assessment / Plan:     Visit Diagnoses: No diagnosis found.  ***  Orders: No orders of the defined types were placed in this encounter.  No orders of the defined types were placed in this encounter.    Follow-Up Instructions: No follow-ups on file.   Fuller Plan, MD  Note - This record has been created  using [employer].  Chart creation errors have been sought, but may not always  have been located. Such creation errors do not reflect on  the standard of medical care.

## 2023-01-10 ENCOUNTER — Ambulatory Visit: Payer: Medicaid Other | Admitting: Internal Medicine

## 2023-02-20 NOTE — Progress Notes (Deleted)
Office Visit Note  Patient: [name] [name]             Date of Birth: November 28, 1990           MRN: [id number]             PCP: Barbarann Ehlers Referring: Barbarann Ehlers Visit Date: 03/06/2023   Subjective:  No chief complaint on file.   History of Present Illness: [name] [name] is a 32 y.o. 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    Previous HPI 09/19/2022 [name] [name] is a 32 y.o. 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  She has remained off the medication since that time.  Has some associated constipation after the surgery no diarrhea issues.  Has persistent body aches widespread not sure which parts specifically worse related to the medication change.  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  Prior to her surgery had multiple upper respiratory infection with antibiotic treatment for apparently strep throat twice.   Previous HPI 03/20/22 [name] [name] is a 32 y.o. 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  Still on the nortriptyline 10 mg and the tizanidine 4 mg as needed for fibromyalgia and myofascial pain.  Overall she has been doing fairly well did have a flareup of increase symptoms around July and August.  During this time with stiffness joint pain and a lot more skin rashes.  She had a skin biopsy with dermatology findings consistent with eczema there is also some concern for an inflammatory dermatitis somewhat nonspecific but  thought likely related to connective tissue disease.  Looks like this is treated with addition of topical steroid not on new long-term systemic medication.  She is working with physical therapy they have expressed concern for joint hypermobility particularly involving her bilateral knees.  Discussed this being a possible contributor to her ongoing joint pain and stiffness and recommended discussion at our follow-up.  She is seeing bariatric clinic currently working with dietitian trying to work on her exercise and diet in anticipation of bariatric surgery next year.  Currently remains very limited in the exercise component because of worsened pain after exertion.  Likely aiming for bariatric surgery in January February next year.   Previous HPI 09/14/21 [name] [name] is a 32 y.o. 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 Around the time of our last visit she started to feel better with an increase in physical activity including some hiking. She developed severe migraine headache associated with nausea and vomiting and went to the ED on 3/17 for this problem. She was treated with migraine cocktail but symptoms remained and eventually improved at home after about 2 more days. She was seen to have a new erythematous rash at her right hip not similar to any previous skin involvement. She was prescribed topical and systemic steroids took about 2 days of prednisone in total. She had skin punch biopsy on 3/23 this was read as somewhat nonspecific but suggestive for  eczematous rash. This whole series of events somewhat resolved on its own but she continues to have a lot of generalized muscular and joint pain. She started on nortriptyline for FMS and feels her fatigue is partially improved but the neck, back, and hip pains remain an issue.   Previous HPI 06/08/2021 [name] [name] is a 32 y.o. female here for follow up for UCTD with FMS on HCQ 400 mg daily after last visit recommended adding  meloxicam 15 mg daily for increased left shoulder pain no extensive workup at that time. She also had increase in rash and swelling around margins of her old tattoos coming and going. The shoulder and hip pain are greatly improved with the meloxicam. She has not noticed any side effect or intolerance with the medication either. Her skin rash around the tattoos is better but she has increased redness and irritation on her face and torso and arms. She also feels very fatigued worse than usual with this.   Previous HPI 05/09/21 [name] [name] is a 32 y.o. 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  She was doing overall okay has been worse since she got sick with pneumonia and required antibiotics treatment.  First she developed severe worsening of her facial rash with warm and hot sensation in the area.  She experienced worsening joint pain in the left shoulder and some worsening of her right hip although that was chronically painful.  Besides joint pain she noticed some swelling around the margins of numerous skin tattoos this was coming and going.  She felt there is increase in joint pain near areas where she was seeing the tattoo swelling.    Previous HPI: 08/17/20 [name] [name] is a 32 y.o. 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 She has been experiencing increased pain all over but most problematic are in her bilateral arms distal to the elbows and in her hands. This is causing increased pain overnight and when using her hands. Symptoms are severe enough to prevent her ability to work currently with numbness and difficulty strongly gripping items. In addition she has worse fatigue constantly. She does not recall any particular event or change in situation before these increased symptoms     No Rheumatology ROS completed.   PMFS History:  Patient Active Problem List   Diagnosis Date  Noted   S/P bariatric surgery 09/19/2022   Benign joint hypermobility 03/20/2022   Neck pain 09/14/2021   Pain in left shoulder 05/09/2021   Pain in right hip 05/09/2021   Chronic eczematous otitis externa of both ears 12/13/2020   Obesity 12/06/2020   Sleep disturbance 08/27/2020   Undifferentiated connective tissue disease (HCC) 08/17/2020   Bilateral hand pain 08/17/2020   Fibromyalgia 05/10/2020   Facial rash 04/26/2020   Elevated sedimentation rate 03/30/2020   Mixed hyperlipidemia 11/03/2019   Polyarthralgia 11/03/2019   Vitamin D deficiency 09/09/2019   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   GAD (generalized anxiety disorder) 09/11/2018   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   Allergic reactions 04/06/2015   Chronic rhinitis 04/06/2015   Elevated blood pressure reading without diagnosis of hypertension 04/06/2015   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   Hot flashes due to surgical menopause 01/26/2015   S/P hysterectomy 06/17/2014   Pelvic pain in female 01/23/2014   Contraception  management 03/31/2013   AKI (acute kidney injury) (HCC) 01/04/2013   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   Hydronephrosis, right 12/30/2012   Pleuritic chest pain 12/30/2012   Pneumonia 12/30/2012   Primary dysmenorrhea 03/07/2012    Past Medical History:  Diagnosis Date   Anemia    Anxiety    from OCD   Arthritis    knee, ankle, left wrist   Chronic pain    muscle spasms- entire body, pinch nerve in upper back   Depression    doing fine now   Fibromyalgia    Per patient   Fracture, ankle    left- x2 - resolved   Headache(784.0)    otc med prn   Heartburn    zantac prn   History of blood transfusion 12/2012   at [facility] - 1 unit transfused   Hydronephrosis    History   Infection    UTI -Resolved - History only   OCD (obsessive compulsive disorder)    Polyarthralgia    Per patient   PONV (postoperative nausea and  vomiting)    Post concussive syndrome    Post traumatic stress disorder (PTSD)    SVD (spontaneous vaginal delivery) 12/2012   x 1 at [facility]   Tachycardia    Per patient    Family History  Problem Relation Age of Onset   Heart disease Mother    Breast cancer Mother    Lupus Mother    Sjogren's syndrome Mother    Hypertension Father    Cancer Paternal Grandmother        lung   Heart disease Paternal Grandmother    Breast cancer Maternal Aunt    Breast cancer Maternal Grandmother    Raynaud syndrome Sister    Lupus Sister    Raynaud syndrome Sister    Past Surgical History:  Procedure Laterality Date   CYSTOSCOPY WITH STENT PLACEMENT Right 12/25/2012   Procedure: CYSTOSCOPY WITH STENT PLACEMENT;  Surgeon: Marcine Matar, MD;  Location: [facility];  Service: Urology;  Laterality: Right;   LAPAROSCOPIC ASSISTED VAGINAL HYSTERECTOMY N/A 06/17/2014   Procedure: LAPAROSCOPIC ASSISTED VAGINAL HYSTERECTOMY;  Surgeon: Lavina Hamman, MD;  Location: [facility];  Service: Gynecology;  Laterality: N/A;   LAPAROSCOPIC BILATERAL SALPINGECTOMY Bilateral 03/31/2013   Procedure: LAPAROSCOPIC BILATERAL SALPINGECTOMY;  Surgeon: Lavina Hamman, MD;  Location: [facility];  Service: Gynecology;  Laterality: Bilateral;   LAPAROSCOPY N/A 01/23/2014   Procedure: LAPAROSCOPY DIAGNOSTIC RIGHT PERITONEAL BIOPSY;  Surgeon: Lavina Hamman, MD;  Location: [facility];  Service: Gynecology;  Laterality: N/A;   nephrostomy tube placement at 25wks and removed at 335/7 Right    OOPHORECTOMY Bilateral 06/17/2014   Procedure: OOPHORECTOMY;  Surgeon: Lavina Hamman, MD;  Location: [facility];  Service: Gynecology;  Laterality: Bilateral;   OTHER SURGICAL HISTORY  07/26/2022   Gastric Sleeve   TUBAL LIGATION     VIDEO ASSISTED THORACOSCOPY (VATS)/DECORTICATION Right 01/08/2013   Procedure: VIDEO ASSISTED THORACOSCOPY DECORTICATION; Drainage of Pleural Effusion;  Surgeon: Loreli Slot, MD;  Location: [facility];  Service: Thoracic;   Laterality: Right;   WISDOM TOOTH EXTRACTION  06/15/2013   Social History   Social History Narrative   Not on file   Immunization History  Administered Date(s) Administered   HPV Quadrivalent 05/09/2007, 09/02/2007   Influenza Split 02/24/2009   PFIZER Comirnaty(Gray Top)Covid-19 Tri-Sucrose Vaccine 08/27/2020   PFIZER(Purple Top)SARS-COV-2 Vaccination 07/10/2019, 08/04/2019   Tdap 10/14/2008, 11/07/2012     Objective: Vital Signs: LMP 01/04/2014    Physical Exam  Musculoskeletal Exam: ***  CDAI Exam: CDAI Score: -- Patient Global: --; Provider Global: -- Swollen: --; Tender: -- Joint Exam 03/06/2023   No joint exam has been documented for this visit   There is currently no information documented on the homunculus. Go to the Rheumatology activity and complete the homunculus joint exam.  Investigation: No additional findings.  Imaging: No results found.  Recent Labs: Lab Results  Component Value Date   WBC 8.1 03/20/2022   HGB 11.4 (L) 03/20/2022   PLT 264 03/20/2022   NA 141 03/20/2022   K 4.5 03/20/2022   CL 105 03/20/2022   CO2 28 03/20/2022   GLUCOSE 95 03/20/2022   BUN 10 03/20/2022   CREATININE 0.72 03/20/2022   BILITOT 0.2 03/20/2022   ALKPHOS 91 11/07/2016   AST 14 03/20/2022   ALT 21 03/20/2022   PROT 6.8 03/20/2022   ALBUMIN 4.5 11/07/2016   CALCIUM 9.2 03/20/2022   GFRAA 111 06/07/2020    Speciality Comments: PLQ Eye Exam 10/06/2020 Normal [facility] f/u 1 year 10/06/2021 appt  Procedures:  No procedures performed Allergies: Amoxicillin-pot clavulanate, Baclofen, Cinnamon, Nickel, Other, Shellfish allergy, Duloxetine hcl, Gabapentin, Hydroxyzine, Metformin and related, Prednisone, Zoloft [sertraline], Phenergan [promethazine hcl], and Promethazine   Assessment / Plan:     Visit Diagnoses: No diagnosis found.  ***  Orders: No orders of the defined types were placed in this encounter.  No orders of the defined types were  placed in this encounter.    Follow-Up Instructions: No follow-ups on file.   Metta Clines, RT  Note - This record has been created using [employer].  Chart creation errors have been sought, but may not always  have been located. Such creation errors do not reflect on  the standard of medical care.

## 2023-03-06 ENCOUNTER — Ambulatory Visit: Payer: Medicaid Other | Admitting: Internal Medicine

## 2023-03-06 DIAGNOSIS — M357 Hypermobility syndrome: Secondary | ICD-10-CM

## 2023-03-06 DIAGNOSIS — M797 Fibromyalgia: Secondary | ICD-10-CM

## 2023-03-06 DIAGNOSIS — R21 Rash and other nonspecific skin eruption: Secondary | ICD-10-CM

## 2023-03-06 DIAGNOSIS — Z9884 Bariatric surgery status: Secondary | ICD-10-CM

## 2023-03-06 DIAGNOSIS — Z79899 Other long term (current) drug therapy: Secondary | ICD-10-CM

## 2023-03-06 DIAGNOSIS — M359 Systemic involvement of connective tissue, unspecified: Secondary | ICD-10-CM

## 2023-03-08 ENCOUNTER — Ambulatory Visit: Payer: Medicaid Other | Admitting: Internal Medicine

## 2023-03-08 NOTE — Progress Notes (Deleted)
Office Visit Note  Patient: [name] [name]             Date of Birth: 1990/11/14           MRN: [id number]             PCP: Barbarann Ehlers Referring: Barbarann Ehlers Visit Date: 03/08/2023   Subjective:  No chief complaint on file.   History of Present Illness: [name] [name] is a 32 y.o. female here for follow up ***   Previous HPI 09/19/22 [name] [name] is a 32 y.o. 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  She has remained off the medication since that time.  Has some associated constipation after the surgery no diarrhea issues.  Has persistent body aches widespread not sure which parts specifically worse related to the medication change.  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  Prior to her surgery had multiple upper respiratory infection with antibiotic treatment for apparently strep throat twice.   Previous HPI 03/20/22 [name] [name] is a 32 y.o. 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  Still on the nortriptyline 10 mg and the tizanidine 4 mg as needed for fibromyalgia and myofascial pain.  Overall she has been doing fairly well did have a flareup of increase symptoms around July and August.  During this time with stiffness joint pain and a lot more skin rashes.  She had a skin biopsy with dermatology findings consistent with eczema there is also some concern for an inflammatory dermatitis somewhat nonspecific but thought likely related to connective tissue disease.  Looks like this is treated with addition of topical steroid not on new long-term systemic  medication.  She is working with physical therapy they have expressed concern for joint hypermobility particularly involving her bilateral knees.  Discussed this being a possible contributor to her ongoing joint pain and stiffness and recommended discussion at our follow-up.  She is seeing bariatric clinic currently working with dietitian trying to work on her exercise and diet in anticipation of bariatric surgery next year.  Currently remains very limited in the exercise component because of worsened pain after exertion.  Likely aiming for bariatric surgery in January February next year.   Previous HPI 09/14/21 [name] [name] is a 32 y.o. 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 Around the time of our last visit she started to feel better with an increase in physical activity including some hiking. She developed severe migraine headache associated with nausea and vomiting and went to the ED on 3/17 for this problem. She was treated with migraine cocktail but symptoms remained and eventually improved at home after about 2 more days. She was seen to have a new erythematous rash at her right hip not similar to any previous skin involvement. She was prescribed topical and systemic steroids took about 2 days of prednisone in total. She had skin punch biopsy on 3/23 this was read as somewhat nonspecific but suggestive for eczematous rash. This whole series of events somewhat resolved on its own but she continues to have a lot  of generalized muscular and joint pain. She started on nortriptyline for FMS and feels her fatigue is partially improved but the neck, back, and hip pains remain an issue.   Previous HPI 06/08/2021 [name] [name] is a 32 y.o. 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 She also had increase in rash and swelling around margins of  her old tattoos coming and going. The shoulder and hip pain are greatly improved with the meloxicam. She has not noticed any side effect or intolerance with the medication either. Her skin rash around the tattoos is better but she has increased redness and irritation on her face and torso and arms. She also feels very fatigued worse than usual with this.   Previous HPI 05/09/21 [name] [name] is a 32 y.o. 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  She was doing overall okay has been worse since she got sick with pneumonia and required antibiotics treatment.  First she developed severe worsening of her facial rash with warm and hot sensation in the area.  She experienced worsening joint pain in the left shoulder and some worsening of her right hip although that was chronically painful.  Besides joint pain she noticed some swelling around the margins of numerous skin tattoos this was coming and going.  She felt there is increase in joint pain near areas where she was seeing the tattoo swelling.    Previous HPI: 08/17/20 [name] [name] is a 32 y.o. 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 She has been experiencing increased pain all over but most problematic are in her bilateral arms distal to the elbows and in her hands. This is causing increased pain overnight and when using her hands. Symptoms are severe enough to prevent her ability to work currently with numbness and difficulty strongly gripping items. In addition she has worse fatigue constantly. She does not recall any particular event or change in situation before these increased symptoms   No Rheumatology ROS completed.   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   Neck pain 09/14/2021   Pain in left shoulder 05/09/2021   Pain in  right hip 05/09/2021   Chronic eczematous otitis externa of both ears 12/13/2020   Obesity 12/06/2020   Sleep disturbance 08/27/2020   Undifferentiated connective tissue disease (HCC) 08/17/2020   Bilateral hand pain 08/17/2020   Fibromyalgia 05/10/2020   Facial rash 04/26/2020   Elevated sedimentation rate 03/30/2020   Mixed hyperlipidemia 11/03/2019   Polyarthralgia 11/03/2019   Vitamin D deficiency 09/09/2019   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   GAD (generalized anxiety disorder) 09/11/2018   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   Allergic reactions 04/06/2015   Chronic rhinitis 04/06/2015   Elevated blood pressure reading without diagnosis of hypertension 04/06/2015   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   Hot flashes due to surgical menopause 01/26/2015   S/P hysterectomy 06/17/2014   Pelvic pain in female 01/23/2014   Contraception management 03/31/2013   AKI (acute kidney injury) (HCC) 01/04/2013   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   Hydronephrosis, right 12/30/2012  Pleuritic chest pain 12/30/2012   Pneumonia 12/30/2012   Primary dysmenorrhea 03/07/2012    Past Medical History:  Diagnosis Date   Anemia    Anxiety    from OCD   Arthritis    knee, ankle, left wrist   Chronic pain    muscle spasms- entire body, pinch nerve in upper back   Depression    doing fine now   Fibromyalgia    Per patient   Fracture, ankle    left- x2 - resolved   Headache(784.0)    otc med prn   Heartburn    zantac prn   History of blood transfusion 12/2012   at [facility] - 1 unit transfused   Hydronephrosis    History   Infection    UTI -Resolved - History only   OCD (obsessive compulsive disorder)    Polyarthralgia    Per patient   PONV (postoperative nausea and vomiting)    Post concussive syndrome    Post traumatic stress disorder (PTSD)    SVD (spontaneous vaginal delivery) 12/2012   x 1 at [facility]    Tachycardia    Per patient    Family History  Problem Relation Age of Onset   Heart disease Mother    Breast cancer Mother    Lupus Mother    Sjogren's syndrome Mother    Hypertension Father    Cancer Paternal Grandmother        lung   Heart disease Paternal Grandmother    Breast cancer Maternal Aunt    Breast cancer Maternal Grandmother    Raynaud syndrome Sister    Lupus Sister    Raynaud syndrome Sister    Past Surgical History:  Procedure Laterality Date   CYSTOSCOPY WITH STENT PLACEMENT Right 12/25/2012   Procedure: CYSTOSCOPY WITH STENT PLACEMENT;  Surgeon: Marcine Matar, MD;  Location: [facility];  Service: Urology;  Laterality: Right;   LAPAROSCOPIC ASSISTED VAGINAL HYSTERECTOMY N/A 06/17/2014   Procedure: LAPAROSCOPIC ASSISTED VAGINAL HYSTERECTOMY;  Surgeon: Lavina Hamman, MD;  Location: [facility];  Service: Gynecology;  Laterality: N/A;   LAPAROSCOPIC BILATERAL SALPINGECTOMY Bilateral 03/31/2013   Procedure: LAPAROSCOPIC BILATERAL SALPINGECTOMY;  Surgeon: Lavina Hamman, MD;  Location: [facility];  Service: Gynecology;  Laterality: Bilateral;   LAPAROSCOPY N/A 01/23/2014   Procedure: LAPAROSCOPY DIAGNOSTIC RIGHT PERITONEAL BIOPSY;  Surgeon: Lavina Hamman, MD;  Location: [facility];  Service: Gynecology;  Laterality: N/A;   nephrostomy tube placement at 25wks and removed at 335/7 Right    OOPHORECTOMY Bilateral 06/17/2014   Procedure: OOPHORECTOMY;  Surgeon: Lavina Hamman, MD;  Location: [facility];  Service: Gynecology;  Laterality: Bilateral;   OTHER SURGICAL HISTORY  07/26/2022   Gastric Sleeve   TUBAL LIGATION     VIDEO ASSISTED THORACOSCOPY (VATS)/DECORTICATION Right 01/08/2013   Procedure: VIDEO ASSISTED THORACOSCOPY DECORTICATION; Drainage of Pleural Effusion;  Surgeon: Loreli Slot, MD;  Location: [facility] OR;  Service: Thoracic;  Laterality: Right;   WISDOM TOOTH EXTRACTION  06/15/2013   Social History   Social History Narrative   Not on file   Immunization History   Administered Date(s) Administered   HPV Quadrivalent 05/09/2007, 09/02/2007   Influenza Split 02/24/2009   PFIZER Comirnaty(Gray Top)Covid-19 Tri-Sucrose Vaccine 08/27/2020   PFIZER(Purple Top)SARS-COV-2 Vaccination 07/10/2019, 08/04/2019   Tdap 10/14/2008, 11/07/2012     Objective: Vital Signs: LMP 01/04/2014    Physical Exam   Musculoskeletal Exam: ***  CDAI Exam: CDAI Score: -- Patient Global: --; Provider Global: -- Swollen: --; Tender: -- Joint Exam  03/08/2023   No joint exam has been documented for this visit   There is currently no information documented on the homunculus. Go to the Rheumatology activity and complete the homunculus joint exam.  Investigation: No additional findings.  Imaging: No results found.  Recent Labs: Lab Results  Component Value Date   WBC 8.1 03/20/2022   HGB 11.4 (L) 03/20/2022   PLT 264 03/20/2022   NA 141 03/20/2022   K 4.5 03/20/2022   CL 105 03/20/2022   CO2 28 03/20/2022   GLUCOSE 95 03/20/2022   BUN 10 03/20/2022   CREATININE 0.72 03/20/2022   BILITOT 0.2 03/20/2022   ALKPHOS 91 11/07/2016   AST 14 03/20/2022   ALT 21 03/20/2022   PROT 6.8 03/20/2022   ALBUMIN 4.5 11/07/2016   CALCIUM 9.2 03/20/2022   GFRAA 111 06/07/2020    Speciality Comments: PLQ Eye Exam 10/06/2020 Normal [facility] f/u 1 year 10/06/2021 appt  Procedures:  No procedures performed Allergies: Amoxicillin-pot clavulanate, Baclofen, Cinnamon, Nickel, Other, Shellfish allergy, Duloxetine hcl, Gabapentin, Hydroxyzine, Metformin and related, Prednisone, Zoloft [sertraline], Phenergan [promethazine hcl], and Promethazine   Assessment / Plan:     Visit Diagnoses: No diagnosis found.  ***  Orders: No orders of the defined types were placed in this encounter.  No orders of the defined types were placed in this encounter.    Follow-Up Instructions: No follow-ups on file.   Fuller Plan, MD  Note - This record has been created  using [employer].  Chart creation errors have been sought, but may not always  have been located. Such creation errors do not reflect on  the standard of medical care.

## 2023-03-14 ENCOUNTER — Ambulatory Visit: Payer: Medicaid Other | Admitting: Internal Medicine

## 2023-03-14 NOTE — Progress Notes (Deleted)
Office Visit Note  Patient: [name] [name]             Date of Birth: 07-08-1990           MRN: [id number]             PCP: Barbarann Ehlers Referring: Barbarann Ehlers Visit Date: 03/14/2023   Subjective:  No chief complaint on file.   History of Present Illness: [name] [name] is a 32 y.o. female here for follow up ***   Previous HPI 09/19/22 [name] [name] is a 32 y.o. 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  She has remained off the medication since that time.  Has some associated constipation after the surgery no diarrhea issues.  Has persistent body aches widespread not sure which parts specifically worse related to the medication change.  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  Prior to her surgery had multiple upper respiratory infection with antibiotic treatment for apparently strep throat twice.   Previous HPI 03/20/22 [name] [name] is a 32 y.o. 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  Still on the nortriptyline 10 mg and the tizanidine 4 mg as needed for fibromyalgia and myofascial pain.  Overall she has been doing fairly well did have a flareup of increase symptoms around July and August.  During this time with stiffness joint pain and a lot more skin rashes.  She had a skin biopsy with dermatology findings consistent with eczema there is also some concern for an inflammatory dermatitis somewhat nonspecific but thought likely related to connective tissue disease.  Looks like this is treated with addition of topical steroid not on new long-term systemic  medication.  She is working with physical therapy they have expressed concern for joint hypermobility particularly involving her bilateral knees.  Discussed this being a possible contributor to her ongoing joint pain and stiffness and recommended discussion at our follow-up.  She is seeing bariatric clinic currently working with dietitian trying to work on her exercise and diet in anticipation of bariatric surgery next year.  Currently remains very limited in the exercise component because of worsened pain after exertion.  Likely aiming for bariatric surgery in January February next year.   Previous HPI 09/14/21 [name] [name] is a 32 y.o. 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 Around the time of our last visit she started to feel better with an increase in physical activity including some hiking. She developed severe migraine headache associated with nausea and vomiting and went to the ED on 3/17 for this problem. She was treated with migraine cocktail but symptoms remained and eventually improved at home after about 2 more days. She was seen to have a new erythematous rash at her right hip not similar to any previous skin involvement. She was prescribed topical and systemic steroids took about 2 days of prednisone in total. She had skin punch biopsy on 3/23 this was read as somewhat nonspecific but suggestive for eczematous rash. This whole series of events somewhat resolved on its own but she continues to have a lot  of generalized muscular and joint pain. She started on nortriptyline for FMS and feels her fatigue is partially improved but the neck, back, and hip pains remain an issue.   Previous HPI 06/08/2021 [name] [name] is a 32 y.o. 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 She also had increase in rash and swelling around margins of  her old tattoos coming and going. The shoulder and hip pain are greatly improved with the meloxicam. She has not noticed any side effect or intolerance with the medication either. Her skin rash around the tattoos is better but she has increased redness and irritation on her face and torso and arms. She also feels very fatigued worse than usual with this.   Previous HPI 05/09/21 [name] [name] is a 32 y.o. 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  She was doing overall okay has been worse since she got sick with pneumonia and required antibiotics treatment.  First she developed severe worsening of her facial rash with warm and hot sensation in the area.  She experienced worsening joint pain in the left shoulder and some worsening of her right hip although that was chronically painful.  Besides joint pain she noticed some swelling around the margins of numerous skin tattoos this was coming and going.  She felt there is increase in joint pain near areas where she was seeing the tattoo swelling.    Previous HPI: 08/17/20 [name] [name] is a 32 y.o. 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 She has been experiencing increased pain all over but most problematic are in her bilateral arms distal to the elbows and in her hands. This is causing increased pain overnight and when using her hands. Symptoms are severe enough to prevent her ability to work currently with numbness and difficulty strongly gripping items. In addition she has worse fatigue constantly. She does not recall any particular event or change in situation before these increased symptoms   No Rheumatology ROS completed.   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   Neck pain 09/14/2021   Pain in left shoulder 05/09/2021   Pain in  right hip 05/09/2021   Chronic eczematous otitis externa of both ears 12/13/2020   Obesity 12/06/2020   Sleep disturbance 08/27/2020   Undifferentiated connective tissue disease (HCC) 08/17/2020   Bilateral hand pain 08/17/2020   Fibromyalgia 05/10/2020   Facial rash 04/26/2020   Elevated sedimentation rate 03/30/2020   Mixed hyperlipidemia 11/03/2019   Polyarthralgia 11/03/2019   Vitamin D deficiency 09/09/2019   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   GAD (generalized anxiety disorder) 09/11/2018   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   Allergic reactions 04/06/2015   Chronic rhinitis 04/06/2015   Elevated blood pressure reading without diagnosis of hypertension 04/06/2015   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   Hot flashes due to surgical menopause 01/26/2015   S/P hysterectomy 06/17/2014   Pelvic pain in female 01/23/2014   Contraception management 03/31/2013   AKI (acute kidney injury) (HCC) 01/04/2013   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   Hydronephrosis, right 12/30/2012  Pleuritic chest pain 12/30/2012   Pneumonia 12/30/2012   Primary dysmenorrhea 03/07/2012    Past Medical History:  Diagnosis Date   Anemia    Anxiety    from OCD   Arthritis    knee, ankle, left wrist   Chronic pain    muscle spasms- entire body, pinch nerve in upper back   Depression    doing fine now   Fibromyalgia    Per patient   Fracture, ankle    left- x2 - resolved   Headache(784.0)    otc med prn   Heartburn    zantac prn   History of blood transfusion 12/2012   at [facility] - 1 unit transfused   Hydronephrosis    History   Infection    UTI -Resolved - History only   OCD (obsessive compulsive disorder)    Polyarthralgia    Per patient   PONV (postoperative nausea and vomiting)    Post concussive syndrome    Post traumatic stress disorder (PTSD)    SVD (spontaneous vaginal delivery) 12/2012   x 1 at [facility]    Tachycardia    Per patient    Family History  Problem Relation Age of Onset   Heart disease Mother    Breast cancer Mother    Lupus Mother    Sjogren's syndrome Mother    Hypertension Father    Cancer Paternal Grandmother        lung   Heart disease Paternal Grandmother    Breast cancer Maternal Aunt    Breast cancer Maternal Grandmother    Raynaud syndrome Sister    Lupus Sister    Raynaud syndrome Sister    Past Surgical History:  Procedure Laterality Date   CYSTOSCOPY WITH STENT PLACEMENT Right 12/25/2012   Procedure: CYSTOSCOPY WITH STENT PLACEMENT;  Surgeon: Marcine Matar, MD;  Location: [facility];  Service: Urology;  Laterality: Right;   LAPAROSCOPIC ASSISTED VAGINAL HYSTERECTOMY N/A 06/17/2014   Procedure: LAPAROSCOPIC ASSISTED VAGINAL HYSTERECTOMY;  Surgeon: Lavina Hamman, MD;  Location: [facility];  Service: Gynecology;  Laterality: N/A;   LAPAROSCOPIC BILATERAL SALPINGECTOMY Bilateral 03/31/2013   Procedure: LAPAROSCOPIC BILATERAL SALPINGECTOMY;  Surgeon: Lavina Hamman, MD;  Location: [facility];  Service: Gynecology;  Laterality: Bilateral;   LAPAROSCOPY N/A 01/23/2014   Procedure: LAPAROSCOPY DIAGNOSTIC RIGHT PERITONEAL BIOPSY;  Surgeon: Lavina Hamman, MD;  Location: [facility];  Service: Gynecology;  Laterality: N/A;   nephrostomy tube placement at 25wks and removed at 335/7 Right    OOPHORECTOMY Bilateral 06/17/2014   Procedure: OOPHORECTOMY;  Surgeon: Lavina Hamman, MD;  Location: [facility];  Service: Gynecology;  Laterality: Bilateral;   OTHER SURGICAL HISTORY  07/26/2022   Gastric Sleeve   TUBAL LIGATION     VIDEO ASSISTED THORACOSCOPY (VATS)/DECORTICATION Right 01/08/2013   Procedure: VIDEO ASSISTED THORACOSCOPY DECORTICATION; Drainage of Pleural Effusion;  Surgeon: Loreli Slot, MD;  Location: [facility] OR;  Service: Thoracic;  Laterality: Right;   WISDOM TOOTH EXTRACTION  06/15/2013   Social History   Social History Narrative   Not on file   Immunization History   Administered Date(s) Administered   HPV Quadrivalent 05/09/2007, 09/02/2007   Influenza Split 02/24/2009   PFIZER Comirnaty(Gray Top)Covid-19 Tri-Sucrose Vaccine 08/27/2020   PFIZER(Purple Top)SARS-COV-2 Vaccination 07/10/2019, 08/04/2019   Tdap 10/14/2008, 11/07/2012     Objective: Vital Signs: LMP 01/04/2014    Physical Exam   Musculoskeletal Exam: ***  CDAI Exam: CDAI Score: -- Patient Global: --; Provider Global: -- Swollen: --; Tender: -- Joint Exam  03/14/2023   No joint exam has been documented for this visit   There is currently no information documented on the homunculus. Go to the Rheumatology activity and complete the homunculus joint exam.  Investigation: No additional findings.  Imaging: No results found.  Recent Labs: Lab Results  Component Value Date   WBC 8.1 03/20/2022   HGB 11.4 (L) 03/20/2022   PLT 264 03/20/2022   NA 141 03/20/2022   K 4.5 03/20/2022   CL 105 03/20/2022   CO2 28 03/20/2022   GLUCOSE 95 03/20/2022   BUN 10 03/20/2022   CREATININE 0.72 03/20/2022   BILITOT 0.2 03/20/2022   ALKPHOS 91 11/07/2016   AST 14 03/20/2022   ALT 21 03/20/2022   PROT 6.8 03/20/2022   ALBUMIN 4.5 11/07/2016   CALCIUM 9.2 03/20/2022   GFRAA 111 06/07/2020    Speciality Comments: PLQ Eye Exam 10/06/2020 Normal [facility] f/u 1 year 10/06/2021 appt  Procedures:  No procedures performed Allergies: Amoxicillin-pot clavulanate, Baclofen, Cinnamon, Nickel, Other, Shellfish allergy, Duloxetine hcl, Gabapentin, Hydroxyzine, Metformin and related, Prednisone, Zoloft [sertraline], Phenergan [promethazine hcl], and Promethazine   Assessment / Plan:     Visit Diagnoses: No diagnosis found.  ***  Orders: No orders of the defined types were placed in this encounter.  No orders of the defined types were placed in this encounter.    Follow-Up Instructions: No follow-ups on file.   Fuller Plan, MD  Note - This record has been created  using [employer].  Chart creation errors have been sought, but may not always  have been located. Such creation errors do not reflect on  the standard of medical care.

## 2023-03-21 ENCOUNTER — Ambulatory Visit: Payer: Medicaid Other | Admitting: Internal Medicine

## 2023-05-03 NOTE — Progress Notes (Addendum)
Office Visit Note  Patient: [name] [name]             Date of Birth: [birth year]           MRN: [id number]             PCP: Barbarann Ehlers Referring: Jordan Hawks, PA-C Visit Date: 05/15/2023   Subjective:  Follow-up   History of Present Illness:   Discussed the use of AI scribe software for clinical note transcription with the patient, who gave verbal consent to proceed.  History of Present Illness   [name] [name] is a 33 y.o. 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   She experienced a recent flare up of symptoms, characterized by a low-grade fever and facial rash. Subsided since about 1-2 weeks ago. There is increased dryness in her eyes and mouth, which she finds particularly bothersome. She manages these symptoms with lozenges and Biotene products. No recent major illnesses requiring antibiotics have occurred.  Significant joint pain, especially in the right hip, is present, which she describes as severe. A recent flare-up left her bedridden for a week. Pain is most pronounced in her hands, hip, and other joints, worsening with movement and building up throughout the day. Her history of joint hypermobility and connective tissue disease is believed to contribute to these symptoms. Due to a gastric sleeve surgery, she cannot take NSAIDs and instead receives Toradol and cortisone injections every six to eight weeks. She also uses tizanidine sparingly due to its sedative effects.  She has lost about 20 pounds since her last visit six months ago, attributing this to weight loss efforts and starting Lyrica, which has alleviated some pain and muscular issues. She switched from nortriptyline to Pamelor about a year ago and continues to take Plaquenil daily.  An eye exam for Plaquenil monitoring is scheduled in two weeks. She has been unable to  engage in physical therapy due to a recent flare-up and personal circumstances, including a fall and family illnesses.   Previous HPI 09/19/2022 [name] [name] is a 33 y.o. 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  She has remained off the medication since that time.  Has some associated constipation after the surgery no diarrhea issues.  Has persistent body aches widespread not sure which parts specifically worse related to the medication change.  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  Prior to her surgery had multiple upper respiratory infection with antibiotic treatment for apparently strep throat twice.   Previous HPI 03/20/22 [name] [name] is a 33 y.o. 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  Still on the nortriptyline 10 mg and the tizanidine 4 mg as needed for fibromyalgia and myofascial pain.  Overall she has been doing fairly well did have a flareup of increase symptoms around July and August.  During this time with stiffness joint pain and a lot more skin rashes.  She had a skin biopsy with dermatology findings consistent with eczema there is also some concern for an inflammatory dermatitis  somewhat nonspecific but thought likely related to connective tissue disease.  Looks like this is treated with addition of topical steroid not on new long-term systemic medication.  She is working with physical therapy they have expressed concern for joint hypermobility particularly involving her bilateral knees.  Discussed this being a possible contributor to her ongoing joint pain and stiffness  and recommended discussion at our follow-up.  She is seeing bariatric clinic currently working with dietitian trying to work on her exercise and diet in anticipation of bariatric surgery next year.  Currently remains very limited in the exercise component because of worsened pain after exertion.  Likely aiming for bariatric surgery in January February next year.   Previous HPI 09/14/21 [name] [name] is a 33 y.o. 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 Around the time of our last visit she started to feel better with an increase in physical activity including some hiking. She developed severe migraine headache associated with nausea and vomiting and went to the ED on 3/17 for this problem. She was treated with migraine cocktail but symptoms remained and eventually improved at home after about 2 more days. She was seen to have a new erythematous rash at her right hip not similar to any previous skin involvement. She was prescribed topical and systemic steroids took about 2 days of prednisone in total. She had skin punch biopsy on 3/23 this was read as somewhat nonspecific but suggestive for eczematous rash. This whole series of events somewhat resolved on its own but she continues to have a lot of generalized muscular and joint pain. She started on nortriptyline for FMS and feels her fatigue is partially improved but the neck, back, and hip pains remain an issue.   Previous HPI 06/08/2021 [name] [name] is a 33 y.o. 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 She also had increase in rash and swelling around margins of her old tattoos coming and going. The shoulder and hip pain are greatly improved with the meloxicam. She has not noticed any side effect or intolerance with the medication either. Her skin rash around the tattoos is better but she  has increased redness and irritation on her face and torso and arms. She also feels very fatigued worse than usual with this.   Previous HPI 05/09/21 KRISTEEN LANTZ is a 33 y.o. 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  She was doing overall okay has been worse since she got sick with pneumonia and required antibiotics treatment.  First she developed severe worsening of her facial rash with warm and hot sensation in the area.  She experienced worsening joint pain in the left shoulder and some worsening of her right hip although that was chronically painful.  Besides joint pain she noticed some swelling around the margins of numerous skin tattoos this was coming and going.  She felt there is increase in joint pain near areas where she was seeing the tattoo swelling.    Previous HPI: 08/17/20 KYRIE BUN is a 33 y.o. 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 She has been experiencing increased pain all over but most problematic are in her bilateral arms distal to the  elbows and in her hands. This is causing increased pain overnight and when using her hands. Symptoms are severe enough to prevent her ability to work currently with numbness and difficulty strongly gripping items. In addition she has worse fatigue constantly. She does not recall any particular event or change in situation before these increased symptoms   Review of Systems  Constitutional:  Positive for fatigue.  HENT:  Positive for mouth dryness. Negative for mouth sores.   Eyes:  Positive for dryness.  Respiratory:  Positive for shortness of breath.   Cardiovascular:  Positive for chest pain and palpitations.  Gastrointestinal:  Positive for constipation. Negative for blood in stool and diarrhea.  Endocrine: Negative for increased urination.  Genitourinary:  Negative for involuntary urination.  Musculoskeletal:   Positive for joint pain, gait problem, joint pain, joint swelling, myalgias, muscle weakness, morning stiffness, muscle tenderness and myalgias.  Skin:  Positive for rash and sensitivity to sunlight. Negative for color change and hair loss.  Allergic/Immunologic: Positive for susceptible to infections.  Neurological:  Positive for dizziness and headaches.  Hematological:  Negative for swollen glands.  Psychiatric/Behavioral:  Positive for depressed mood. Negative for sleep disturbance. The patient is nervous/anxious.     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   Neck pain 09/14/2021   Pain in left shoulder 05/09/2021   Pain in right hip 05/09/2021   Chronic eczematous otitis externa of both ears 12/13/2020   Obesity 12/06/2020   Sleep disturbance 08/27/2020   Undifferentiated connective tissue disease (HCC) 08/17/2020   Bilateral hand pain 08/17/2020   Fibromyalgia 05/10/2020   Facial rash 04/26/2020   Elevated sedimentation rate 03/30/2020   Mixed hyperlipidemia 11/03/2019   Polyarthralgia 11/03/2019   Vitamin D deficiency 09/09/2019   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   GAD (generalized anxiety disorder) 09/11/2018   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   Allergic reactions 04/06/2015   Chronic rhinitis 04/06/2015   Elevated blood pressure reading without diagnosis of hypertension 04/06/2015   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   Hot flashes due to surgical menopause 01/26/2015   S/P hysterectomy 06/17/2014   Pelvic pain in female 01/23/2014   Contraception management 03/31/2013   AKI (acute kidney injury) (HCC) 01/04/2013   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   Hydronephrosis, right 12/30/2012   Pleuritic chest pain 12/30/2012   Pneumonia 12/30/2012   Primary dysmenorrhea 03/07/2012    Past Medical History:   Diagnosis Date   Anemia    Anxiety    from OCD   Arthritis    knee, ankle, left wrist   Chronic pain    muscle spasms- entire body, pinch nerve in upper back   Depression    doing fine now   Fibromyalgia    Per patient   Fracture, ankle    left- x2 - resolved   Headache(784.0)    otc med prn   Heartburn    zantac prn   History of blood transfusion 12/2012   at [facility] - 1 unit transfused   Hydronephrosis    History   Infection    UTI -Resolved - History only   OCD (obsessive compulsive disorder)    Polyarthralgia    Per patient   PONV (postoperative nausea and vomiting)    Post concussive syndrome    Post traumatic stress disorder (PTSD)  SVD (spontaneous vaginal delivery) 12/2012   x 1 at [facility]   Tachycardia    Per patient    Family History  Problem Relation Age of Onset   Heart disease Mother    Breast cancer Mother    Lupus Mother    Sjogren's syndrome Mother    Hypertension Father    Cancer Paternal Grandmother        lung   Heart disease Paternal Grandmother    Breast cancer Maternal Aunt    Breast cancer Maternal Grandmother    Raynaud syndrome Sister    Lupus Sister    Raynaud syndrome Sister    Past Surgical History:  Procedure Laterality Date   CYSTOSCOPY WITH STENT PLACEMENT Right 12/25/2012   Procedure: CYSTOSCOPY WITH STENT PLACEMENT;  Surgeon: Marcine Matar, MD;  Location: [facility];  Service: Urology;  Laterality: Right;   LAPAROSCOPIC ASSISTED VAGINAL HYSTERECTOMY N/A 06/17/2014   Procedure: LAPAROSCOPIC ASSISTED VAGINAL HYSTERECTOMY;  Surgeon: Lavina Hamman, MD;  Location: [facility];  Service: Gynecology;  Laterality: N/A;   LAPAROSCOPIC BILATERAL SALPINGECTOMY Bilateral 03/31/2013   Procedure: LAPAROSCOPIC BILATERAL SALPINGECTOMY;  Surgeon: Lavina Hamman, MD;  Location: [facility];  Service: Gynecology;  Laterality: Bilateral;   LAPAROSCOPY N/A 01/23/2014   Procedure: LAPAROSCOPY DIAGNOSTIC RIGHT PERITONEAL BIOPSY;  Surgeon: Lavina Hamman, MD;  Location: [facility];  Service: Gynecology;  Laterality: N/A;   nephrostomy tube placement at 25wks and removed at 335/7 Right    OOPHORECTOMY Bilateral 06/17/2014   Procedure: OOPHORECTOMY;  Surgeon: Lavina Hamman, MD;  Location: [facility];  Service: Gynecology;  Laterality: Bilateral;   OTHER SURGICAL HISTORY  07/26/2022   Gastric Sleeve   TUBAL LIGATION     VIDEO ASSISTED THORACOSCOPY (VATS)/DECORTICATION Right 01/08/2013   Procedure: VIDEO ASSISTED THORACOSCOPY DECORTICATION; Drainage of Pleural Effusion;  Surgeon: Loreli Slot, MD;  Location: [facility];  Service: Thoracic;  Laterality: Right;   WISDOM TOOTH EXTRACTION  06/15/2013   Social History   Social History Narrative   Not on file   Immunization History  Administered Date(s) Administered   HPV Quadrivalent 05/09/2007, 09/02/2007   Influenza Split 02/24/2009   PFIZER Comirnaty(Gray Top)Covid-19 Tri-Sucrose Vaccine 08/27/2020   PFIZER(Purple Top)SARS-COV-2 Vaccination 07/10/2019, 08/04/2019   Tdap 10/14/2008, 11/07/2012     Objective: Vital Signs: BP 122/87 (BP Location: Left Arm, Patient Position: Sitting, Cuff Size: Normal)   Pulse 88   Resp 14   Ht 5\' 3"  (1.6 m)   Wt 188 lb (85.3 kg)   LMP 01/04/2014   BMI 33.30 kg/m    Physical Exam Eyes:     Conjunctiva/sclera: Conjunctivae normal.  Cardiovascular:     Rate and Rhythm: Normal rate and regular rhythm.  Pulmonary:     Effort: Pulmonary effort is normal.     Breath sounds: Normal breath sounds.  Musculoskeletal:     Right lower leg: No edema.     Left lower leg: No edema.  Lymphadenopathy:     Cervical: No cervical adenopathy.  Skin:    General: Skin is warm and dry.     Findings: No rash.  Neurological:     Mental Status: She is alert.  Psychiatric:        Mood and Affect: Mood normal.      Musculoskeletal Exam:  Elbows full ROM no tenderness or swelling Wrists full ROM no tenderness or swelling Fingers full ROM no tenderness or  swelling Right sided low back pain worse over SI joint Lateral hip pain with FABER movement, tenderness  to pressure, no anterior pain Knees full ROM, patellar instability, no swelling   Investigation: No additional findings.  Imaging: No results found.  Recent Labs: Lab Results  Component Value Date   WBC 8.1 03/20/2022   HGB 11.4 (L) 03/20/2022   PLT 264 03/20/2022   NA 141 03/20/2022   K 4.5 03/20/2022   CL 105 03/20/2022   CO2 28 03/20/2022   GLUCOSE 95 03/20/2022   BUN 10 03/20/2022   CREATININE 0.72 03/20/2022   BILITOT 0.2 03/20/2022   ALKPHOS 91 11/07/2016   AST 14 03/20/2022   ALT 21 03/20/2022   PROT 6.8 03/20/2022   ALBUMIN 4.5 11/07/2016   CALCIUM 9.2 03/20/2022   GFRAA 111 06/07/2020    Speciality Comments: [facility] Eye Exam 10/06/2020 Normal [facility] f/u 1 year 10/06/2021 appt  Procedures:  No procedures performed Allergies: Amoxicillin-pot clavulanate, Baclofen, Cinnamon, Nickel, Other, Shellfish allergy, Duloxetine hcl, Gabapentin, Hydroxyzine, Metformin and related, Prednisone, Zoloft [sertraline], Phenergan [promethazine hcl], and Promethazine   Assessment / Plan:     Visit Diagnoses: Undifferentiated connective tissue disease (HCC) - Plan: Sedimentation rate Inflammatory symptoms appear controlled. Checking sed rate for disease activity monitoring. Plan to continue HCQ 400 mg daily.  High risk medication use - hydroxychloroquine 400 mg daily. PLQ Eye Exam 10/06/2020 Normal. Needs updated PLQ eye exam. - Plan: CBC with Differential/Platelet, BASIC METABOLIC PANEL WITH GFR No serious interval infections. Tolerating medicines okay. Needs updated eye exam, has appt with [facility] within next 2 months per patient. Checking CBC and BMP medication monitoring.   Fibromyalgia Improved pain and muscular issues with Lyrica and weight loss. Recent flare with increased pain, low-grade fever, and rash. -Continue Lyrica. -Consider physical therapy for joint  hypermobility and pain management.  Sjogren's Syndrome Recent increase in dryness symptoms, particularly in eyes and mouth. -Continue Plaquenil once daily. -Encourage use of Biotene products, sugar-free gum or lozenges, and a humidifier to manage dryness. -Follow up with ophthalmology appointment for Plaquenil monitoring.  Right Hip Pain Likely related to muscle, tendon, and bursa inflammation rather than joint pathology. -Consider use of Tizanidine for severe pain episodes, taking at night PRN due to sedation. -Continue weight loss and exercise/PT as tolerated.    Orders: Orders Placed This Encounter  Procedures   Sedimentation rate   CBC with Differential/Platelet   BASIC METABOLIC PANEL WITH GFR   Meds ordered this encounter  Medications   hydroxychloroquine (PLAQUENIL) 200 MG tablet    Sig: TAKE 2 TABLETS(400 MG) BY MOUTH DAILY    Dispense:  180 tablet    Refill:  1     Follow-Up Instructions: Return in about 6 months (around 11/12/2023) for UCTD/FMS on HCQ f/u 6mos.   Fuller Plan, MD  Note - This record has been created using [employer].  Chart creation errors have been sought, but may not always  have been located. Such creation errors do not reflect on  the standard of medical care.

## 2023-05-15 ENCOUNTER — Ambulatory Visit: Payer: Medicare Other | Attending: Internal Medicine | Admitting: Internal Medicine

## 2023-05-15 ENCOUNTER — Encounter: Payer: Self-pay | Admitting: Internal Medicine

## 2023-05-15 VITALS — BP 122/87 | HR 88 | Resp 14 | Ht 63.0 in | Wt 188.0 lb

## 2023-05-15 DIAGNOSIS — Z79899 Other long term (current) drug therapy: Secondary | ICD-10-CM | POA: Diagnosis present

## 2023-05-15 DIAGNOSIS — R21 Rash and other nonspecific skin eruption: Secondary | ICD-10-CM | POA: Diagnosis present

## 2023-05-15 DIAGNOSIS — M359 Systemic involvement of connective tissue, unspecified: Secondary | ICD-10-CM | POA: Diagnosis present

## 2023-05-15 DIAGNOSIS — M797 Fibromyalgia: Secondary | ICD-10-CM

## 2023-05-15 DIAGNOSIS — M357 Hypermobility syndrome: Secondary | ICD-10-CM

## 2023-05-15 DIAGNOSIS — M2550 Pain in unspecified joint: Secondary | ICD-10-CM

## 2023-05-15 DIAGNOSIS — Z9884 Bariatric surgery status: Secondary | ICD-10-CM | POA: Diagnosis present

## 2023-05-15 MED ORDER — HYDROXYCHLOROQUINE SULFATE 200 MG PO TABS: ORAL_TABLET | ORAL | 1 refills | Status: DC

## 2023-05-15 NOTE — Addendum Note (Signed)
Addended by: Fuller Plan on: 05/15/2023 10:28 AM   Modules accepted: Orders

## 2023-05-16 LAB — BASIC METABOLIC PANEL WITH GFR
BUN: 16 mg/dL (ref 7–25)
CO2: 30 mmol/L (ref 20–32)
Calcium: 9.8 mg/dL (ref 8.6–10.2)
Chloride: 103 mmol/L (ref 98–110)
Creat: 0.88 mg/dL (ref 0.50–0.97)
Glucose, Bld: 83 mg/dL (ref 65–99)
Potassium: 4.2 mmol/L (ref 3.5–5.3)
Sodium: 141 mmol/L (ref 135–146)
eGFR: 89 mL/min/{1.73_m2} (ref >=60)

## 2023-05-16 LAB — CBC WITH DIFFERENTIAL/PLATELET
Absolute Lymphocytes: 2828 {cells}/uL (ref 850–3900)
Absolute Monocytes: 520 {cells}/uL (ref 200–950)
Basophils Absolute: 52 {cells}/uL (ref 0–200)
Basophils Relative: 0.8 %
Eosinophils Absolute: 202 {cells}/uL (ref 15–500)
Eosinophils Relative: 3.1 %
HCT: 38.4 % (ref 35.0–45.0)
Hemoglobin: 12.6 g/dL (ref 11.7–15.5)
MCH: 27 pg (ref 27.0–33.0)
MCHC: 32.8 g/dL (ref 32.0–36.0)
MCV: 82.4 fL (ref 80.0–100.0)
MPV: 10.6 fL (ref 7.5–12.5)
Monocytes Relative: 8 %
Neutro Abs: 2899 {cells}/uL (ref 1500–7800)
Neutrophils Relative %: 44.6 %
Platelets: 278 10*3/uL (ref 140–400)
RBC: 4.66 10*6/uL (ref 3.80–5.10)
RDW: 12.1 % (ref 11.0–15.0)
Total Lymphocyte: 43.5 %
WBC: 6.5 10*3/uL (ref 3.8–10.8)

## 2023-05-16 LAB — SEDIMENTATION RATE: Sed Rate: 34 mm/h — ABNORMAL HIGH (ref 0–20)

## 2023-07-04 ENCOUNTER — Ambulatory Visit: Admitting: Internal Medicine

## 2023-07-30 ENCOUNTER — Ambulatory Visit: Admitting: Internal Medicine

## 2023-07-30 NOTE — Progress Notes (Deleted)
 Office Visit Note  Patient: [name] [name]             Date of Birth: [birth year]           MRN: [id number]             PCP: Barbarann Ehlers Referring: Barbarann Ehlers Visit Date: 07/30/2023   Subjective:  No chief complaint on file.   History of Present Illness: [name] [name] is a 33 y.o. female here for follow up ***   Previous HPI 05/15/23 [name] [name] is a 33 y.o. 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    She experienced a recent flare up of symptoms, characterized by a low-grade fever and facial rash. Subsided since about 1-2 weeks ago. There is increased dryness in her eyes and mouth, which she finds particularly bothersome. She manages these symptoms with lozenges and Biotene products. No recent major illnesses requiring antibiotics have occurred.   Significant joint pain, especially in the right hip, is present, which she describes as severe. A recent flare-up left her bedridden for a week. Pain is most pronounced in her hands, hip, and other joints, worsening with movement and building up throughout the day. Her history of joint hypermobility and connective tissue disease is believed to contribute to these symptoms. Due to a gastric sleeve surgery, she cannot take NSAIDs and instead receives Toradol and cortisone injections every six to eight weeks. She also uses tizanidine sparingly due to its sedative effects.   She has lost about 20 pounds since her last visit six months ago, attributing this to weight loss efforts and starting Lyrica, which has alleviated some pain and muscular issues. She switched from nortriptyline to Pamelor about a year ago and continues to take Plaquenil daily.   An eye exam for Plaquenil monitoring is scheduled in two weeks. She has been unable to engage in physical therapy due to a recent flare-up and  personal circumstances, including a fall and family illnesses.     Previous HPI 09/19/2022 [name] [name] is a 33 y.o. 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  She has remained off the medication since that time.  Has some associated constipation after the surgery no diarrhea issues.  Has persistent body aches widespread not sure which parts specifically worse related to the medication change.  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  Prior to her surgery had multiple upper respiratory infection with antibiotic treatment for apparently strep throat twice.   Previous HPI 03/20/22 [name] [name] is a 33 y.o. 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  Still on the nortriptyline 10 mg and the tizanidine 4 mg as needed for fibromyalgia and myofascial pain.  Overall she has been doing fairly well did have a flareup of increase symptoms around July and August.  During this time with stiffness joint pain and a lot more skin rashes.  She had a skin biopsy with dermatology findings consistent with eczema there is also some concern for an inflammatory dermatitis somewhat  nonspecific but thought likely related to connective tissue disease.  Looks like this is treated with addition of topical steroid not on new long-term systemic medication.  She is working with physical therapy they have expressed concern for joint hypermobility particularly involving her bilateral knees.  Discussed this being a possible contributor to her ongoing joint pain and stiffness and recommended discussion at our follow-up.  She is  seeing bariatric clinic currently working with dietitian trying to work on her exercise and diet in anticipation of bariatric surgery next year.  Currently remains very limited in the exercise component because of worsened pain after exertion.  Likely aiming for bariatric surgery in January February next year.   Previous HPI 09/14/21 [name] [name] is a 33 y.o. 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 Around the time of our last visit she started to feel better with an increase in physical activity including some hiking. She developed severe migraine headache associated with nausea and vomiting and went to the ED on 3/17 for this problem. She was treated with migraine cocktail but symptoms remained and eventually improved at home after about 2 more days. She was seen to have a new erythematous rash at her right hip not similar to any previous skin involvement. She was prescribed topical and systemic steroids took about 2 days of prednisone in total. She had skin punch biopsy on 3/23 this was read as somewhat nonspecific but suggestive for eczematous rash. This whole series of events somewhat resolved on its own but she continues to have a lot of generalized muscular and joint pain. She started on nortriptyline for FMS and feels her fatigue is partially improved but the neck, back, and hip pains remain an issue.   Previous HPI 06/08/2021 [name] [name] is a 33 y.o. 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 She also had increase in rash and swelling around margins of her old tattoos coming and going. The shoulder and hip pain are greatly improved with the meloxicam. She has not noticed any side effect or intolerance with the medication either. Her skin rash around the tattoos is better but she has increased redness and irritation on her face and  torso and arms. She also feels very fatigued worse than usual with this.   Previous HPI 05/09/21 [name] [name] is a 33 y.o. 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  She was doing overall okay has been worse since she got sick with pneumonia and required antibiotics treatment.  First she developed severe worsening of her facial rash with warm and hot sensation in the area.  She experienced worsening joint pain in the left shoulder and some worsening of her right hip although that was chronically painful.  Besides joint pain she noticed some swelling around the margins of numerous skin tattoos this was coming and going.  She felt there is increase in joint pain near areas where she was seeing the tattoo swelling.    Previous HPI: 08/17/20 [name] [name] is a 33 y.o. 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 She has been experiencing increased pain all over but most problematic are in her bilateral arms distal to the elbows  and in her hands. This is causing increased pain overnight and when using her hands. Symptoms are severe enough to prevent her ability to work currently with numbness and difficulty strongly gripping items. In addition she has worse fatigue constantly. She does not recall any particular event or change in situation before these increased symptoms   No Rheumatology ROS completed.   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   Neck pain 09/14/2021   Pain in left shoulder 05/09/2021   Pain in right hip 05/09/2021   Chronic eczematous otitis externa of both ears 12/13/2020   Obesity 12/06/2020   Sleep disturbance 08/27/2020   Undifferentiated connective tissue disease (HCC) 08/17/2020   Bilateral hand pain 08/17/2020   Fibromyalgia 05/10/2020   Facial rash 04/26/2020    Elevated sedimentation rate 03/30/2020   Mixed hyperlipidemia 11/03/2019   Polyarthralgia 11/03/2019   Vitamin D deficiency 09/09/2019   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   GAD (generalized anxiety disorder) 09/11/2018   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   Allergic reactions 04/06/2015   Chronic rhinitis 04/06/2015   Elevated blood pressure reading without diagnosis of hypertension 04/06/2015   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   Hot flashes due to surgical menopause 01/26/2015   S/P hysterectomy 06/17/2014   Pelvic pain in female 01/23/2014   Contraception management 03/31/2013   AKI (acute kidney injury) (HCC) 01/04/2013   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   Hydronephrosis, right 12/30/2012   Pleuritic chest pain 12/30/2012   Pneumonia 12/30/2012   Primary dysmenorrhea 03/07/2012    Past Medical History:  Diagnosis Date   Anemia    Anxiety    from OCD   Arthritis    knee, ankle, left wrist   Chronic pain    muscle spasms- entire body, pinch nerve in upper back   Depression    doing fine now   Fibromyalgia    Per patient   Fracture, ankle    left- x2 - resolved   Headache(784.0)    otc med prn   Heartburn    zantac prn   History of blood transfusion 12/2012   at [facility] - 1 unit transfused   Hydronephrosis    History   Infection    UTI -Resolved - History only   OCD (obsessive compulsive disorder)    Polyarthralgia    Per patient   PONV (postoperative nausea and vomiting)    Post concussive syndrome    Post traumatic stress disorder (PTSD)    SVD (spontaneous vaginal delivery) 12/2012   x 1 at [facility]   Tachycardia    Per patient    Family History  Problem Relation Age of Onset   Heart disease Mother    Breast cancer Mother    Lupus Mother    Sjogren's syndrome Mother    Hypertension Father    Cancer Paternal Grandmother        lung   Heart disease Paternal Grandmother    Breast  cancer Maternal Aunt    Breast cancer Maternal Grandmother    Raynaud syndrome Sister    Lupus Sister    Raynaud syndrome Sister    Past Surgical History:  Procedure Laterality Date   CYSTOSCOPY WITH STENT PLACEMENT Right 12/25/2012   Procedure: CYSTOSCOPY WITH STENT PLACEMENT;  Surgeon: Marcine Matar, MD;  Location: [facility];  Service: Urology;  Laterality: Right;   LAPAROSCOPIC ASSISTED VAGINAL HYSTERECTOMY N/A 06/17/2014   Procedure: LAPAROSCOPIC ASSISTED VAGINAL HYSTERECTOMY;  Surgeon: Lavina Hamman, MD;  Location: [facility];  Service: Gynecology;  Laterality: N/A;   LAPAROSCOPIC BILATERAL SALPINGECTOMY Bilateral 03/31/2013   Procedure: LAPAROSCOPIC BILATERAL SALPINGECTOMY;  Surgeon: Lavina Hamman, MD;  Location: [facility];  Service: Gynecology;  Laterality: Bilateral;   LAPAROSCOPY N/A 01/23/2014   Procedure: LAPAROSCOPY DIAGNOSTIC RIGHT PERITONEAL BIOPSY;  Surgeon: Lavina Hamman, MD;  Location: [facility];  Service: Gynecology;  Laterality: N/A;   nephrostomy tube placement at 25wks and removed at 335/7 Right    OOPHORECTOMY Bilateral 06/17/2014   Procedure: OOPHORECTOMY;  Surgeon: Lavina Hamman, MD;  Location: [facility];  Service: Gynecology;  Laterality: Bilateral;   OTHER SURGICAL HISTORY  07/26/2022   Gastric Sleeve   TUBAL LIGATION     VIDEO ASSISTED THORACOSCOPY (VATS)/DECORTICATION Right 01/08/2013   Procedure: VIDEO ASSISTED THORACOSCOPY DECORTICATION; Drainage of Pleural Effusion;  Surgeon: Loreli Slot, MD;  Location: [facility] OR;  Service: Thoracic;  Laterality: Right;   WISDOM TOOTH EXTRACTION  06/15/2013   Social History   Social History Narrative   Not on file   Immunization History  Administered Date(s) Administered   HPV Quadrivalent 05/09/2007, 09/02/2007   Influenza Split 02/24/2009   PFIZER Comirnaty(Gray Top)Covid-19 Tri-Sucrose Vaccine 08/27/2020   PFIZER(Purple Top)SARS-COV-2 Vaccination 07/10/2019, 08/04/2019   Tdap 10/14/2008, 11/07/2012      Objective: Vital Signs: LMP 01/04/2014    Physical Exam   Musculoskeletal Exam: ***  CDAI Exam: CDAI Score: -- Patient Global: --; Provider Global: -- Swollen: --; Tender: -- Joint Exam 07/30/2023   No joint exam has been documented for this visit   There is currently no information documented on the homunculus. Go to the Rheumatology activity and complete the homunculus joint exam.  Investigation: No additional findings.  Imaging: No results found.  Recent Labs: Lab Results  Component Value Date   WBC 6.5 05/15/2023   HGB 12.6 05/15/2023   PLT 278 05/15/2023   NA 141 05/15/2023   K 4.2 05/15/2023   CL 103 05/15/2023   CO2 30 05/15/2023   GLUCOSE 83 05/15/2023   BUN 16 05/15/2023   CREATININE 0.88 05/15/2023   BILITOT 0.2 03/20/2022   ALKPHOS 91 11/07/2016   AST 14 03/20/2022   ALT 21 03/20/2022   PROT 6.8 03/20/2022   ALBUMIN 4.5 11/07/2016   CALCIUM 9.8 05/15/2023   GFRAA 111 06/07/2020    Speciality Comments: PLQ Eye Exam 10/06/2020 Normal [facility] f/u 1 year 10/06/2021 appt  Procedures:  No procedures performed Allergies: Amoxicillin-pot clavulanate, Baclofen, Cinnamon, Nickel, Other, Shellfish allergy, Duloxetine hcl, Gabapentin, Hydroxyzine, Metformin and related, Prednisone, Zoloft [sertraline], Phenergan [promethazine hcl], and Promethazine   Assessment / Plan:     Visit Diagnoses: No diagnosis found.  ***  Orders: No orders of the defined types were placed in this encounter.  No orders of the defined types were placed in this encounter.    Follow-Up Instructions: No follow-ups on file.   Fuller Plan, MD  Note - This record has been created using [employer].  Chart creation errors have been sought, but may not always  have been located. Such creation errors do not reflect on  the standard of medical care.

## 2023-09-12 NOTE — Progress Notes (Deleted)
 [name] [address] [place] [address] Phone: [phone] Subjective:    I'm seeing this patient by the request  of:  Helena Loach  CC: hypermobility and pain   [name] is a 33 y.o. female coming in with complaint of hypermobility   Previous CT head and CT cervical done in August 2023 were unremarkable.   Laboratory workup showed a urinalysis in February of this year showing a urinary tract infection, has had elevated sedimentation rate dating back for 3 years. Past Medical History:  Diagnosis Date   Anemia    Anxiety    from OCD   Arthritis    knee, ankle, left wrist   Chronic pain    muscle spasms- entire body, pinch nerve in upper back   Depression    doing fine now   Fibromyalgia    Per patient   Fracture, ankle    left- x2 - resolved   Headache(784.0)    otc med prn   Heartburn    zantac prn   History of blood transfusion 12/2012   at [facility] - 1 unit transfused   Hydronephrosis    History   Infection    UTI -Resolved - History only   OCD (obsessive compulsive disorder)    Polyarthralgia    Per patient   PONV (postoperative nausea and vomiting)    Post concussive syndrome    Post traumatic stress disorder (PTSD)    SVD (spontaneous vaginal delivery) 12/2012   x 1 at [facility]   Tachycardia    Per patient   Past Surgical History:  Procedure Laterality Date   CYSTOSCOPY WITH STENT PLACEMENT Right 12/25/2012   Procedure: CYSTOSCOPY WITH STENT PLACEMENT;  Surgeon: Trent Frizzle, MD;  Location: [facility];  Service: Urology;  Laterality: Right;   LAPAROSCOPIC ASSISTED VAGINAL HYSTERECTOMY N/A 06/17/2014   Procedure: LAPAROSCOPIC ASSISTED VAGINAL HYSTERECTOMY;  Surgeon: Cyd Dowse, MD;  Location: [facility];  Service: Gynecology;  Laterality: N/A;   LAPAROSCOPIC BILATERAL SALPINGECTOMY Bilateral 03/31/2013   Procedure: LAPAROSCOPIC BILATERAL SALPINGECTOMY;  Surgeon: Cyd Dowse, MD;   Location: [facility];  Service: Gynecology;  Laterality: Bilateral;   LAPAROSCOPY N/A 01/23/2014   Procedure: LAPAROSCOPY DIAGNOSTIC RIGHT PERITONEAL BIOPSY;  Surgeon: Cyd Dowse, MD;  Location: [facility];  Service: Gynecology;  Laterality: N/A;   nephrostomy tube placement at 25wks and removed at 335/7 Right    OOPHORECTOMY Bilateral 06/17/2014   Procedure: OOPHORECTOMY;  Surgeon: Cyd Dowse, MD;  Location: [facility];  Service: Gynecology;  Laterality: Bilateral;   OTHER SURGICAL HISTORY  07/26/2022   Gastric Sleeve   TUBAL LIGATION     VIDEO ASSISTED THORACOSCOPY (VATS)/DECORTICATION Right 01/08/2013   Procedure: VIDEO ASSISTED THORACOSCOPY DECORTICATION; Drainage of Pleural Effusion;  Surgeon: Zelphia Higashi, MD;  Location: [facility] OR;  Service: Thoracic;  Laterality: Right;   WISDOM TOOTH EXTRACTION  06/15/2013   Social History   Socioeconomic History   Marital status: Single    Spouse name: Not on file   Number of children: Not on file   Years of education: Not on file   Highest education level: Not on file  Occupational History   Not on file  Tobacco Use   Smoking status: Former    Current packs/day: 0.00    Average packs/day: 0.3 packs/day for 9.0 years (2.3 ttl pk-yrs)    Types: Cigarettes    Start date: 04/13/2007    Quit date: 04/12/2016  Years since quitting: 7.4    Passive exposure: Current   Smokeless tobacco: Never  Vaping Use   Vaping status: Every Day  Substance and Sexual Activity   Alcohol use: Not Currently   Drug use: No   Sexual activity: Not on file  Other Topics Concern   Not on file  Social History Narrative   Not on file   Social Drivers of Health   Financial Resource Strain: Medium Risk (08/01/2023)   Received from [facility]   Overall Financial Resource Strain (CARDIA)    Difficulty of Paying Living Expenses: Somewhat hard  Food Insecurity: Patient Declined (08/01/2023)   Received from [facility]   Hunger Vital Sign    Worried About  Running Out of Food in the Last Year: Patient declined    Ran Out of Food in the Last Year: Patient declined  Transportation Needs: Patient Declined (08/01/2023)   Received from [facility] - Transportation    Lack of Transportation (Medical): Patient declined    Lack of Transportation (Non-Medical): Patient declined  Physical Activity: Unknown (08/01/2023)   [facility]   Exercise Vital Sign    Days of Exercise per Week: Patient declined    Minutes of Exercise per Session: Not on file  Stress: Patient Declined (08/01/2023)   Received from [facility] - Occupational Stress Questionnaire    Feeling of Stress : Patient declined  Social Connections: Patient Declined (08/01/2023)   Received from [facility]   Social Network    How would you rate your social network (family, work, friends)?: Patient declined   Allergies  Allergen Reactions   Amoxicillin-Pot Clavulanate Nausea And Vomiting, Rash and Nausea Only   Baclofen Itching    Baclofen, patient went to experience pruritus, somnolence Baclofen, patient went to experience pruritus, somnolence Baclofen, patient went to experience pruritus, somnolence   Cinnamon Anaphylaxis and Hives   Nickel Rash, Hives and Swelling    swelling    Other Itching, Rash, Shortness Of Breath and Swelling    Chick Pea   Shellfish Allergy Anaphylaxis   Duloxetine Hcl Other (See Comments)    "Made depression worse" Other reaction(s): Other (See Comments) "Made depression worse" "Made depression worse"   Gabapentin Other (See Comments)    Somnolence   Hydroxyzine  Other (See Comments)    Caused worsened anxiety symptoms Other reaction(s): Other (See Comments) "Made anxiety worse" Caused worsened anxiety symptoms   Metformin And Related Other (See Comments)    "Bad taste in mouth"   Prednisone  Other (See Comments)    Irritation    Zoloft [Sertraline] Other (See Comments)     Made depression worse   Phenergan  [Promethazine  Hcl]     vomiting   Promethazine  Nausea And Vomiting   Family History  Problem Relation Age of Onset   Heart disease Mother    Breast cancer Mother    Lupus Mother    Sjogren's syndrome Mother    Hypertension Father    Cancer Paternal Grandmother        lung   Heart disease Paternal Grandmother    Breast cancer Maternal Aunt    Breast cancer Maternal Grandmother    Raynaud syndrome Sister    Lupus Sister    Raynaud syndrome Sister      Current Outpatient Medications (Cardiovascular):    propranolol ER (INDERAL LA) 60 MG 24 hr capsule, Take 60 mg by mouth daily.   rosuvastatin (CRESTOR) 10  MG tablet, Take 10 mg by mouth at bedtime. (Patient not taking: Reported on 05/15/2023)  Current Outpatient Medications (Respiratory):    albuterol  (VENTOLIN  HFA) 108 (90 Base) MCG/ACT inhaler, Inhale into the lungs.   fluticasone  (FLONASE ) 50 MCG/ACT nasal spray, as needed.  Current Outpatient Medications (Analgesics):    EMGALITY 120 MG/ML SOAJ, Inject 1 mL into the skin every 30 (thirty) days.   Current Outpatient Medications (Other):    amphetamine-dextroamphetamine (ADDERALL) 20 MG tablet, Take by mouth. (Patient not taking: Reported on 05/15/2023)   buPROPion (WELLBUTRIN XL) 300 MG 24 hr tablet, TAKE 1 TABLET(300 MG) BY MOUTH DAILY   CALCIUM  PO, Take by mouth 2 (two) times daily.   dicyclomine (BENTYL) 10 MG capsule, Take by mouth. (Patient not taking: Reported on 05/15/2023)   FLUoxetine (PROZAC) 40 MG capsule, Take 40 mg by mouth daily.   hydroxychloroquine  (PLAQUENIL ) 200 MG tablet, TAKE 2 TABLETS(400 MG) BY MOUTH DAILY   JORNAY PM 60 MG CP24, Take 1 capsule by mouth at bedtime.   LORazepam  (ATIVAN ) 1 MG tablet, Take by mouth.   LORazepam  (ATIVAN ) 1 MG tablet, Take by mouth. (Patient not taking: Reported on 05/15/2023)   Misc. Devices MISC, Quad cane with small base for walking  DX: limited mobility, connect tissue disease,  fibromyalgia   Fax to [facility] medical supply   Multiple Vitamin (MULTIVITAMIN) tablet, Take 1 tablet by mouth daily.   Multiple Vitamins-Minerals (HAIR SKIN AND NAILS FORMULA PO), Take by mouth.   ondansetron  (ZOFRAN -ODT) 8 MG disintegrating tablet, Take by mouth.   pantoprazole  (PROTONIX ) 40 MG tablet, Take 40 mg by mouth 2 (two) times daily.   pregabalin (LYRICA) 100 MG capsule, Take by mouth.   tiZANidine (ZANAFLEX) 4 MG tablet, Take 4 mg by mouth at bedtime.   valACYclovir (VALTREX) 1000 MG tablet, Take 1,000 mg by mouth daily.   valACYclovir (VALTREX) 500 MG tablet, Take by mouth.   Vitamin D , Ergocalciferol , (DRISDOL) 1.25 MG (50000 UNIT) CAPS capsule, Take 50,000 Units by mouth once a week.   Reviewed prior external information including notes and imaging from  primary care provider As well as notes that were available from care everywhere and other healthcare systems.  Past medical history, social, surgical and family history all reviewed in electronic medical record.  No pertanent information unless stated regarding to the chief complaint.   Review of Systems:  No headache, visual changes, nausea, vomiting, diarrhea, constipation, dizziness, abdominal pain, skin rash, fevers, chills, night sweats, weight loss, swollen lymph nodes, body aches, joint swelling, chest pain, shortness of breath, mood changes. POSITIVE muscle aches  Objective  Last menstrual period 01/04/2014.   General: No apparent distress alert and oriented x3 mood and affect normal, dressed appropriately.  HEENT: Pupils equal, extraocular movements intact  Respiratory: Patient's speak in full sentences and does not appear short of breath  Cardiovascular: No lower extremity edema, non tender, no erythema  Back exam shows    Impression and Recommendations:     The above documentation has been reviewed and is accurate and complete Jenna Ardoin M Geoffery Aultman, DO

## 2023-09-14 ENCOUNTER — Ambulatory Visit: Admitting: Family Medicine

## 2023-10-23 ENCOUNTER — Encounter: Payer: Self-pay | Admitting: Family Medicine

## 2023-11-12 ENCOUNTER — Ambulatory Visit: Payer: Medicare Other | Admitting: Internal Medicine

## 2023-11-12 NOTE — Progress Notes (Deleted)
 Office Visit Note  Patient: [name] [name]             Date of Birth: 15-Feb-1991           MRN: [id number]             PCP: Vaughn Lauraine KATHEE DEVONNA Referring: Vaughn Lauraine KATHEE DEVONNA Visit Date: 11/14/2023   Subjective:  No chief complaint on file.   History of Present Illness: [name] [name] is a 33 y.o. 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    Previous HPI 05/15/2023 [name] [name] is a 33 y.o. 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    She experienced a recent flare up of symptoms, characterized by a low-grade fever and facial rash. Subsided since about 1-2 weeks ago. There is increased dryness in her eyes and mouth, which she finds particularly bothersome. She manages these symptoms with lozenges and Biotene products. No recent major illnesses requiring antibiotics have occurred.   Significant joint pain, especially in the right hip, is present, which she describes as severe. A recent flare-up left her bedridden for a week. Pain is most pronounced in her hands, hip, and other joints, worsening with movement and building up throughout the day. Her history of joint hypermobility and connective tissue disease is believed to contribute to these symptoms. Due to a gastric sleeve surgery, she cannot take NSAIDs and instead receives Toradol  and cortisone injections every six to eight weeks. She also uses tizanidine sparingly due to its sedative effects.   She has lost about 20 pounds since her last visit six months ago, attributing this to weight loss efforts and starting Lyrica, which has alleviated some pain and muscular issues. She switched from nortriptyline to Pamelor about a year ago and continues to take  Plaquenil  daily.   An eye exam for Plaquenil  monitoring is scheduled in two weeks. She has been unable to engage in physical therapy due to a recent flare-up and personal circumstances, including a fall and family illnesses.     Previous HPI 09/19/2022 [name] [name] is a 33 y.o. 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  She was previously on hydroxychloroquine  but discontinued medication for perioperative management prior to laparoscopic gastric sleeve surgery on April 10.  She has remained off the medication since that time.  Has some associated constipation after the surgery no diarrhea issues.  Has persistent body aches widespread not sure which parts specifically worse related to the medication change.  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  Prior to her surgery had multiple upper respiratory infection with antibiotic treatment for apparently strep throat twice.   Previous HPI 03/20/22 [name] [name] is a 33 y.o. 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  Still on the nortriptyline 10 mg and the tizanidine 4 mg as needed for fibromyalgia and myofascial pain.  Overall she has been doing fairly well did have a flareup of increase symptoms around July and August.  During this time with stiffness joint pain and a lot more skin rashes.  She had  a skin biopsy with dermatology findings consistent with eczema there is also some concern for an inflammatory dermatitis somewhat nonspecific but thought likely related to connective tissue disease.  Looks like this is treated with addition of topical steroid not on new long-term systemic medication.  She is working with physical therapy they have expressed concern for joint hypermobility particularly  involving her bilateral knees.  Discussed this being a possible contributor to her ongoing joint pain and stiffness and recommended discussion at our follow-up.  She is seeing bariatric clinic currently working with dietitian trying to work on her exercise and diet in anticipation of bariatric surgery next year.  Currently remains very limited in the exercise component because of worsened pain after exertion.  Likely aiming for bariatric surgery in January February next year.   Previous HPI 09/14/21 [name] [name] is a 33 y.o. 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 Around the time of our last visit she started to feel better with an increase in physical activity including some hiking. She developed severe migraine headache associated with nausea and vomiting and went to the ED on 3/17 for this problem. She was treated with migraine cocktail but symptoms remained and eventually improved at home after about 2 more days. She was seen to have a new erythematous rash at her right hip not similar to any previous skin involvement. She was prescribed topical and systemic steroids took about 2 days of prednisone  in total. She had skin punch biopsy on 3/23 this was read as somewhat nonspecific but suggestive for eczematous rash. This whole series of events somewhat resolved on its own but she continues to have a lot of generalized muscular and joint pain. She started on nortriptyline for FMS and feels her fatigue is partially improved but the neck, back, and hip pains remain an issue.   Previous HPI 06/08/2021 [name] [name] is a 33 y.o. female here for follow up for UCTD with FMS on HCQ 400 mg daily after last visit recommended adding meloxicam  15 mg daily for increased left shoulder pain no extensive workup at that time. She also had increase in rash and swelling around margins of her old tattoos coming and going. The shoulder and hip pain are greatly improved with the meloxicam . She has not  noticed any side effect or intolerance with the medication either. Her skin rash around the tattoos is better but she has increased redness and irritation on her face and torso and arms. She also feels very fatigued worse than usual with this.   Previous HPI 05/09/21 [name] [name] is a 33 y.o. 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  She was doing overall okay has been worse since she got sick with pneumonia and required antibiotics treatment.  First she developed severe worsening of her facial rash with warm and hot sensation in the area.  She experienced worsening joint pain in the left shoulder and some worsening of her right hip although that was chronically painful.  Besides joint pain she noticed some swelling around the margins of numerous skin tattoos this was coming and going.  She felt there is increase in joint pain near areas where she was seeing the tattoo swelling.    Previous HPI: 08/17/20 [name] [name] is a 33 y.o. female here for follow up for fibromyalgia and UCTD on hydroxychloroquine  but with worsening of overall symptoms since the end of March. She  has been experiencing increased pain all over but most problematic are in her bilateral arms distal to the elbows and in her hands. This is causing increased pain overnight and when using her hands. Symptoms are severe enough to prevent her ability to work currently with numbness and difficulty strongly gripping items. In addition she has worse fatigue constantly. She does not recall any particular event or change in situation before these increased symptoms   No Rheumatology ROS completed.   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   Neck pain 09/14/2021   Pain in left shoulder 05/09/2021   Pain in right hip 05/09/2021   Chronic eczematous otitis externa of both ears 12/13/2020   Obesity 12/06/2020   Sleep  disturbance 08/27/2020   Undifferentiated connective tissue disease (HCC) 08/17/2020   Bilateral hand pain 08/17/2020   Fibromyalgia 05/10/2020   Facial rash 04/26/2020   Elevated sedimentation rate 03/30/2020   Mixed hyperlipidemia 11/03/2019   Polyarthralgia 11/03/2019   Vitamin D  deficiency 09/09/2019   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   GAD (generalized anxiety disorder) 09/11/2018   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   Allergic reactions 04/06/2015   Chronic rhinitis 04/06/2015   Elevated blood pressure reading without diagnosis of hypertension 04/06/2015   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   Hot flashes due to surgical menopause 01/26/2015   S/P hysterectomy 06/17/2014   Pelvic pain in female 01/23/2014   Contraception management 03/31/2013   AKI (acute kidney injury) (HCC) 01/04/2013   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   Hydronephrosis, right 12/30/2012   Pleuritic chest pain 12/30/2012   Pneumonia 12/30/2012   Primary dysmenorrhea 03/07/2012    Past Medical History:  Diagnosis Date   Anemia    Anxiety    from OCD   Arthritis    knee, ankle, left wrist   Chronic pain    muscle spasms- entire body, pinch nerve in upper back   Depression    doing fine now   Fibromyalgia    Per patient   Fracture, ankle    left- x2 - resolved   Headache(784.0)    otc med prn   Heartburn    zantac prn   History of blood transfusion 12/2012   at [facility] - 1 unit transfused   Hydronephrosis    History   Infection    UTI -Resolved - History only   OCD (obsessive compulsive disorder)    Polyarthralgia    Per patient   PONV (postoperative nausea and vomiting)    Post concussive syndrome    Post traumatic stress disorder (PTSD)    SVD (spontaneous vaginal delivery) 12/2012   x 1 at [facility]   Tachycardia    Per patient    Family History  Problem Relation Age of Onset   Heart disease Mother    Breast cancer  Mother    Lupus Mother    Sjogren's syndrome Mother    Hypertension Father    Cancer Paternal Grandmother        lung   Heart disease Paternal Grandmother    Breast cancer Maternal Aunt    Breast cancer Maternal Grandmother    Raynaud syndrome Sister    Lupus Sister    Raynaud syndrome Sister    Past Surgical History:  Procedure Laterality Date   CYSTOSCOPY WITH STENT PLACEMENT Right 12/25/2012  Procedure: CYSTOSCOPY WITH STENT PLACEMENT;  Surgeon: Garnette Shack, MD;  Location: [facility];  Service: Urology;  Laterality: Right;   LAPAROSCOPIC ASSISTED VAGINAL HYSTERECTOMY N/A 06/17/2014   Procedure: LAPAROSCOPIC ASSISTED VAGINAL HYSTERECTOMY;  Surgeon: Krystal Deaner, MD;  Location: [facility];  Service: Gynecology;  Laterality: N/A;   LAPAROSCOPIC BILATERAL SALPINGECTOMY Bilateral 03/31/2013   Procedure: LAPAROSCOPIC BILATERAL SALPINGECTOMY;  Surgeon: Krystal Deaner, MD;  Location: [facility];  Service: Gynecology;  Laterality: Bilateral;   LAPAROSCOPY N/A 01/23/2014   Procedure: LAPAROSCOPY DIAGNOSTIC RIGHT PERITONEAL BIOPSY;  Surgeon: Krystal Deaner, MD;  Location: [facility];  Service: Gynecology;  Laterality: N/A;   nephrostomy tube placement at 25wks and removed at 335/7 Right    OOPHORECTOMY Bilateral 06/17/2014   Procedure: OOPHORECTOMY;  Surgeon: Krystal Deaner, MD;  Location: [facility];  Service: Gynecology;  Laterality: Bilateral;   OTHER SURGICAL HISTORY  07/26/2022   Gastric Sleeve   TUBAL LIGATION     VIDEO ASSISTED THORACOSCOPY (VATS)/DECORTICATION Right 01/08/2013   Procedure: VIDEO ASSISTED THORACOSCOPY DECORTICATION; Drainage of Pleural Effusion;  Surgeon: Elspeth JAYSON Millers, MD;  Location: [facility];  Service: Thoracic;  Laterality: Right;   WISDOM TOOTH EXTRACTION  06/15/2013   Social History   Social History Narrative   Not on file   Immunization History  Administered Date(s) Administered   HPV Quadrivalent 05/09/2007, 09/02/2007   Influenza Split 02/24/2009   PFIZER  Comirnaty(Gray Top)Covid-19 Tri-Sucrose Vaccine 08/27/2020   PFIZER(Purple Top)SARS-COV-2 Vaccination 07/10/2019, 08/04/2019   Tdap 10/14/2008, 11/07/2012     Objective: Vital Signs: LMP 01/04/2014    Physical Exam   Musculoskeletal Exam: ***  CDAI Exam: CDAI Score: -- Patient Global: --; Provider Global: -- Swollen: --; Tender: -- Joint Exam 11/14/2023   No joint exam has been documented for this visit   There is currently no information documented on the homunculus. Go to the Rheumatology activity and complete the homunculus joint exam.  Investigation: No additional findings.  Imaging: No results found.  Recent Labs: Lab Results  Component Value Date   WBC 6.5 05/15/2023   HGB 12.6 05/15/2023   PLT 278 05/15/2023   NA 141 05/15/2023   K 4.2 05/15/2023   CL 103 05/15/2023   CO2 30 05/15/2023   GLUCOSE 83 05/15/2023   BUN 16 05/15/2023   CREATININE 0.88 05/15/2023   BILITOT 0.2 03/20/2022   ALKPHOS 91 11/07/2016   AST 14 03/20/2022   ALT 21 03/20/2022   PROT 6.8 03/20/2022   ALBUMIN 4.5 11/07/2016   CALCIUM  9.8 05/15/2023   GFRAA 111 06/07/2020    Speciality Comments: PLQ Eye Exam 10/06/2020 Normal [facility] f/u 1 year 10/06/2021 appt  Procedures:  No procedures performed Allergies: Amoxicillin-pot clavulanate, Baclofen, Cinnamon, Nickel, Other, Shellfish allergy, Duloxetine hcl, Gabapentin, Hydroxyzine , Metformin and related, Prednisone , Zoloft [sertraline], Phenergan  [promethazine  hcl], and Promethazine    Assessment / Plan:     Visit Diagnoses: No diagnosis found.  ***  Orders: No orders of the defined types were placed in this encounter.  No orders of the defined types were placed in this encounter.    Follow-Up Instructions: No follow-ups on file.   Shelba SHAUNNA Potters, RT  Note - This record has been created using [employer].  Chart creation errors have been sought, but may not always  have been located. Such creation errors do  not reflect on  the standard of medical care.

## 2023-11-14 ENCOUNTER — Ambulatory Visit: Admitting: Internal Medicine

## 2023-11-14 DIAGNOSIS — M797 Fibromyalgia: Secondary | ICD-10-CM

## 2023-11-14 DIAGNOSIS — M359 Systemic involvement of connective tissue, unspecified: Secondary | ICD-10-CM

## 2023-11-14 DIAGNOSIS — Z79899 Other long term (current) drug therapy: Secondary | ICD-10-CM

## 2023-11-20 NOTE — Progress Notes (Deleted)
 [name] [address] [place] [address] Phone: [phone] Subjective:    I'm seeing this patient by the request  of:  Vaughn Lauraine KATHEE DEVONNA  CC: Hypermobility and pain  YEP:Dlagzrupcz  [name] [name] is a 33 y.o. female coming in with complaint of hypermobility. Patient states      Has seen numerous providers over the course of several years.  Has had some elevation of sedimentation rate over the years.  Past Medical History:  Diagnosis Date   Anemia    Anxiety    from OCD   Arthritis    knee, ankle, left wrist   Chronic pain    muscle spasms- entire body, pinch nerve in upper back   Depression    doing fine now   Fibromyalgia    Per patient   Fracture, ankle    left- x2 - resolved   Headache(784.0)    otc med prn   Heartburn    zantac prn   History of blood transfusion 12/2012   at [facility] - 1 unit transfused   Hydronephrosis    History   Infection    UTI -Resolved - History only   OCD (obsessive compulsive disorder)    Polyarthralgia    Per patient   PONV (postoperative nausea and vomiting)    Post concussive syndrome    Post traumatic stress disorder (PTSD)    SVD (spontaneous vaginal delivery) 12/2012   x 1 at [facility]   Tachycardia    Per patient   Past Surgical History:  Procedure Laterality Date   CYSTOSCOPY WITH STENT PLACEMENT Right 12/25/2012   Procedure: CYSTOSCOPY WITH STENT PLACEMENT;  Surgeon: Garnette Shack, MD;  Location: [facility];  Service: Urology;  Laterality: Right;   LAPAROSCOPIC ASSISTED VAGINAL HYSTERECTOMY N/A 06/17/2014   Procedure: LAPAROSCOPIC ASSISTED VAGINAL HYSTERECTOMY;  Surgeon: Krystal Deaner, MD;  Location: [facility];  Service: Gynecology;  Laterality: N/A;   LAPAROSCOPIC BILATERAL SALPINGECTOMY Bilateral 03/31/2013   Procedure: LAPAROSCOPIC BILATERAL SALPINGECTOMY;  Surgeon: Krystal Deaner, MD;  Location: [facility];  Service: Gynecology;  Laterality: Bilateral;   LAPAROSCOPY N/A  01/23/2014   Procedure: LAPAROSCOPY DIAGNOSTIC RIGHT PERITONEAL BIOPSY;  Surgeon: Krystal Deaner, MD;  Location: [facility];  Service: Gynecology;  Laterality: N/A;   nephrostomy tube placement at 25wks and removed at 335/7 Right    OOPHORECTOMY Bilateral 06/17/2014   Procedure: OOPHORECTOMY;  Surgeon: Krystal Deaner, MD;  Location: [facility];  Service: Gynecology;  Laterality: Bilateral;   OTHER SURGICAL HISTORY  07/26/2022   Gastric Sleeve   TUBAL LIGATION     VIDEO ASSISTED THORACOSCOPY (VATS)/DECORTICATION Right 01/08/2013   Procedure: VIDEO ASSISTED THORACOSCOPY DECORTICATION; Drainage of Pleural Effusion;  Surgeon: Elspeth JAYSON Millers, MD;  Location: [facility] OR;  Service: Thoracic;  Laterality: Right;   WISDOM TOOTH EXTRACTION  06/15/2013   Social History   Socioeconomic History   Marital status: Single    Spouse name: Not on file   Number of children: Not on file   Years of education: Not on file   Highest education level: Not on file  Occupational History   Not on file  Tobacco Use   Smoking status: Former    Current packs/day: 0.00    Average packs/day: 0.3 packs/day for 9.0 years (2.3 ttl pk-yrs)    Types: Cigarettes    Start date: 04/13/2007    Quit date: 04/12/2016    Years since quitting: 7.6    Passive exposure: Current   Smokeless  tobacco: Never  Vaping Use   Vaping status: Every Day  Substance and Sexual Activity   Alcohol use: Not Currently   Drug use: No   Sexual activity: Not on file  Other Topics Concern   Not on file  Social History Narrative   Not on file   Social Drivers of Health   Financial Resource Strain: Medium Risk (08/01/2023)   Received from [facility]   Overall Financial Resource Strain (CARDIA)    Difficulty of Paying Living Expenses: Somewhat hard  Food Insecurity: Patient Declined (08/01/2023)   Received from [place]   Hunger Vital Sign    Within the past 12 months, you worried that your food would run out before you got the money to  buy more.: Patient declined    Within the past 12 months, the food you bought just didn't last and you didn't have money to get more.: Patient declined  Transportation Needs: Patient Declined (08/01/2023)   Received from [facility] - Transportation    Lack of Transportation (Medical): Patient declined    Lack of Transportation (Non-Medical): Patient declined  Physical Activity: Unknown (08/01/2023)   Received from [facility]   Exercise Vital Sign    On average, how many days per week do you engage in moderate to strenuous exercise (like a brisk walk)?: Patient declined    Minutes of Exercise per Session: Not on file  Stress: Patient Declined (08/01/2023)   Received from [facility] - Occupational Stress Questionnaire    Feeling of Stress : Patient declined  Social Connections: Patient Declined (08/01/2023)   Received from [facility]   Social Network    How would you rate your social network (family, work, friends)?: Patient declined   Allergies  Allergen Reactions   Amoxicillin-Pot Clavulanate Nausea And Vomiting, Rash and Nausea Only   Baclofen Itching    Baclofen, patient went to experience pruritus, somnolence Baclofen, patient went to experience pruritus, somnolence Baclofen, patient went to experience pruritus, somnolence   Cinnamon Anaphylaxis and Hives   Nickel Rash, Hives and Swelling    swelling    Other Itching, Rash, Shortness Of Breath and Swelling    Chick Pea   Shellfish Allergy Anaphylaxis   Duloxetine Hcl Other (See Comments)    Made depression worse Other reaction(s): Other (See Comments) Made depression worse Made depression worse   Gabapentin Other (See Comments)    Somnolence   Hydroxyzine  Other (See Comments)    Caused worsened anxiety symptoms Other reaction(s): Other (See Comments) Made anxiety worse Caused worsened anxiety symptoms   Metformin And Related Other (See Comments)     Bad taste in mouth   Prednisone  Other (See Comments)    Irritation    Zoloft [Sertraline] Other (See Comments)    Made depression worse   Phenergan  [Promethazine  Hcl]     vomiting   Promethazine  Nausea And Vomiting   Family History  Problem Relation Age of Onset   Heart disease Mother    Breast cancer Mother    Lupus Mother    Sjogren's syndrome Mother    Hypertension Father    Cancer Paternal Grandmother        lung   Heart disease Paternal Grandmother    Breast cancer Maternal Aunt    Breast cancer Maternal Grandmother    Raynaud syndrome Sister    Lupus Sister    Raynaud syndrome Sister      Current Outpatient Medications (  Cardiovascular):    propranolol ER (INDERAL LA) 60 MG 24 hr capsule, Take 60 mg by mouth daily.   rosuvastatin (CRESTOR) 10 MG tablet, Take 10 mg by mouth at bedtime. (Patient not taking: Reported on 05/15/2023)  Current Outpatient Medications (Respiratory):    albuterol  (VENTOLIN  HFA) 108 (90 Base) MCG/ACT inhaler, Inhale into the lungs.   fluticasone  (FLONASE ) 50 MCG/ACT nasal spray, as needed.  Current Outpatient Medications (Analgesics):    EMGALITY 120 MG/ML SOAJ, Inject 1 mL into the skin every 30 (thirty) days.   Current Outpatient Medications (Other):    amphetamine-dextroamphetamine (ADDERALL) 20 MG tablet, Take by mouth. (Patient not taking: Reported on 05/15/2023)   buPROPion (WELLBUTRIN XL) 300 MG 24 hr tablet, TAKE 1 TABLET(300 MG) BY MOUTH DAILY   CALCIUM  PO, Take by mouth 2 (two) times daily.   dicyclomine (BENTYL) 10 MG capsule, Take by mouth. (Patient not taking: Reported on 05/15/2023)   FLUoxetine (PROZAC) 40 MG capsule, Take 40 mg by mouth daily.   hydroxychloroquine  (PLAQUENIL ) 200 MG tablet, TAKE 2 TABLETS(400 MG) BY MOUTH DAILY   JORNAY PM 60 MG CP24, Take 1 capsule by mouth at bedtime.   LORazepam  (ATIVAN ) 1 MG tablet, Take by mouth.   LORazepam  (ATIVAN ) 1 MG tablet, Take by mouth. (Patient not taking: Reported on  05/15/2023)   Misc. Devices MISC, Quad cane with small base for walking  DX: limited mobility, connect tissue disease, fibromyalgia   Fax to [facility] medical supply   Multiple Vitamin (MULTIVITAMIN) tablet, Take 1 tablet by mouth daily.   Multiple Vitamins-Minerals (HAIR SKIN AND NAILS FORMULA PO), Take by mouth.   ondansetron  (ZOFRAN -ODT) 8 MG disintegrating tablet, Take by mouth.   pantoprazole  (PROTONIX ) 40 MG tablet, Take 40 mg by mouth 2 (two) times daily.   pregabalin (LYRICA) 100 MG capsule, Take by mouth.   tiZANidine (ZANAFLEX) 4 MG tablet, Take 4 mg by mouth at bedtime.   valACYclovir (VALTREX) 1000 MG tablet, Take 1,000 mg by mouth daily.   valACYclovir (VALTREX) 500 MG tablet, Take by mouth.   Vitamin D , Ergocalciferol , (DRISDOL) 1.25 MG (50000 UNIT) CAPS capsule, Take 50,000 Units by mouth once a week.   Reviewed prior external information including notes and imaging from  primary care provider As well as notes that were available from care everywhere and other healthcare systems.  Past medical history, social, surgical and family history all reviewed in electronic medical record.  No pertanent information unless stated regarding to the chief complaint.   Review of Systems:  No headache, visual changes, nausea, vomiting, diarrhea, constipation, dizziness, abdominal pain, skin rash, fevers, chills, night sweats, weight loss, swollen lymph nodes, body aches, joint swelling, chest pain, shortness of breath, mood changes. POSITIVE muscle aches  Objective  Last menstrual period 01/04/2014.   General: No apparent distress alert and oriented x3 mood and affect normal, dressed appropriately.  HEENT: Pupils equal, extraocular movements intact  Respiratory: Patient's speak in full sentences and does not appear short of breath  Cardiovascular: No lower extremity edema, non tender, no erythema      Impression and Recommendations:

## 2023-11-21 ENCOUNTER — Ambulatory Visit: Admitting: Family Medicine

## 2023-12-07 NOTE — Progress Notes (Deleted)
 [name] [address] [place] [address] Phone: [phone] Subjective:    I'm seeing this patient by the request  of:  Vaughn Lauraine KATHEE DEVONNA  CC: Hypermobility noted  YEP:Dlagzrupcz  CIERRIA HEIGHT is a 33 y.o. female coming in with complaint of hypermobility. Patient states   Been seen by numerous providers over the course of several years for polyarthralgia and multiple joint complaints.    Past Medical History:  Diagnosis Date   Anemia    Anxiety    from OCD   Arthritis    knee, ankle, left wrist   Chronic pain    muscle spasms- entire body, pinch nerve in upper back   Depression    doing fine now   Fibromyalgia    Per patient   Fracture, ankle    left- x2 - resolved   Headache(784.0)    otc med prn   Heartburn    zantac prn   History of blood transfusion 12/2012   at [facility] - 1 unit transfused   Hydronephrosis    History   Infection    UTI -Resolved - History only   OCD (obsessive compulsive disorder)    Polyarthralgia    Per patient   PONV (postoperative nausea and vomiting)    Post concussive syndrome    Post traumatic stress disorder (PTSD)    SVD (spontaneous vaginal delivery) 12/2012   x 1 at [facility]   Tachycardia    Per patient   Past Surgical History:  Procedure Laterality Date   CYSTOSCOPY WITH STENT PLACEMENT Right 12/25/2012   Procedure: CYSTOSCOPY WITH STENT PLACEMENT;  Surgeon: Garnette Shack, MD;  Location: [facility];  Service: Urology;  Laterality: Right;   LAPAROSCOPIC ASSISTED VAGINAL HYSTERECTOMY N/A 06/17/2014   Procedure: LAPAROSCOPIC ASSISTED VAGINAL HYSTERECTOMY;  Surgeon: Krystal Deaner, MD;  Location: [facility];  Service: Gynecology;  Laterality: N/A;   LAPAROSCOPIC BILATERAL SALPINGECTOMY Bilateral 03/31/2013   Procedure: LAPAROSCOPIC BILATERAL SALPINGECTOMY;  Surgeon: Krystal Deaner, MD;  Location: [facility];  Service: Gynecology;  Laterality: Bilateral;   LAPAROSCOPY N/A 01/23/2014    Procedure: LAPAROSCOPY DIAGNOSTIC RIGHT PERITONEAL BIOPSY;  Surgeon: Krystal Deaner, MD;  Location: [facility];  Service: Gynecology;  Laterality: N/A;   nephrostomy tube placement at 25wks and removed at 335/7 Right    OOPHORECTOMY Bilateral 06/17/2014   Procedure: OOPHORECTOMY;  Surgeon: Krystal Deaner, MD;  Location: [facility];  Service: Gynecology;  Laterality: Bilateral;   OTHER SURGICAL HISTORY  07/26/2022   Gastric Sleeve   TUBAL LIGATION     VIDEO ASSISTED THORACOSCOPY (VATS)/DECORTICATION Right 01/08/2013   Procedure: VIDEO ASSISTED THORACOSCOPY DECORTICATION; Drainage of Pleural Effusion;  Surgeon: Elspeth JAYSON Millers, MD;  Location: [facility] OR;  Service: Thoracic;  Laterality: Right;   WISDOM TOOTH EXTRACTION  06/15/2013   Social History   Socioeconomic History   Marital status: Single    Spouse name: Not on file   Number of children: Not on file   Years of education: Not on file   Highest education level: Not on file  Occupational History   Not on file  Tobacco Use   Smoking status: Former    Current packs/day: 0.00    Average packs/day: 0.3 packs/day for 9.0 years (2.3 ttl pk-yrs)    Types: Cigarettes    Start date: 04/13/2007    Quit date: 04/12/2016    Years since quitting: 7.6    Passive exposure: Current   Smokeless tobacco: Never  Vaping Use  Vaping status: Every Day  Substance and Sexual Activity   Alcohol use: Not Currently   Drug use: No   Sexual activity: Not on file  Other Topics Concern   Not on file  Social History Narrative   Not on file   Social Drivers of Health   Financial Resource Strain: Medium Risk (08/01/2023)   Received from [facility]   Overall Financial Resource Strain (CARDIA)    Difficulty of Paying Living Expenses: Somewhat hard  Food Insecurity: Patient Declined (08/01/2023)   Received from [facility]   Hunger Vital Sign    Within the past 12 months, you worried that your food would run out before you got the money to buy more.:  Patient declined    Within the past 12 months, the food you bought just didn't last and you didn't have money to get more.: Patient declined  Transportation Needs: Patient Declined (08/01/2023)   Received from [facility] - Transportation    Lack of Transportation (Medical): Patient declined    Lack of Transportation (Non-Medical): Patient declined  Physical Activity: Unknown (08/01/2023)   Received from [facility]   Exercise Vital Sign    On average, how many days per week do you engage in moderate to strenuous exercise (like a brisk walk)?: Patient declined    Minutes of Exercise per Session: Not on file  Stress: Patient Declined (08/01/2023)   Received from [facility] - Occupational Stress Questionnaire    Feeling of Stress : Patient declined  Social Connections: Patient Declined (08/01/2023)   Received from [facility]   Social Network    How would you rate your social network (family, work, friends)?: Patient declined   Allergies  Allergen Reactions   Amoxicillin-Pot Clavulanate Nausea And Vomiting, Rash and Nausea Only   Baclofen Itching    Baclofen, patient went to experience pruritus, somnolence Baclofen, patient went to experience pruritus, somnolence Baclofen, patient went to experience pruritus, somnolence   Cinnamon Anaphylaxis and Hives   Nickel Rash, Hives and Swelling    swelling    Other Itching, Rash, Shortness Of Breath and Swelling    Chick Pea   Shellfish Allergy Anaphylaxis   Duloxetine Hcl Other (See Comments)    Made depression worse Other reaction(s): Other (See Comments) Made depression worse Made depression worse   Gabapentin Other (See Comments)    Somnolence   Hydroxyzine  Other (See Comments)    Caused worsened anxiety symptoms Other reaction(s): Other (See Comments) Made anxiety worse Caused worsened anxiety symptoms   Metformin And Related Other (See Comments)    Bad taste  in mouth   Prednisone  Other (See Comments)    Irritation    Zoloft [Sertraline] Other (See Comments)    Made depression worse   Phenergan  [Promethazine  Hcl]     vomiting   Promethazine  Nausea And Vomiting   Family History  Problem Relation Age of Onset   Heart disease Mother    Breast cancer Mother    Lupus Mother    Sjogren's syndrome Mother    Hypertension Father    Cancer Paternal Grandmother        lung   Heart disease Paternal Grandmother    Breast cancer Maternal Aunt    Breast cancer Maternal Grandmother    Raynaud syndrome Sister    Lupus Sister    Raynaud syndrome Sister      Current Outpatient Medications (Cardiovascular):    propranolol ER (INDERAL  LA) 60 MG 24 hr capsule, Take 60 mg by mouth daily.   rosuvastatin (CRESTOR) 10 MG tablet, Take 10 mg by mouth at bedtime. (Patient not taking: Reported on 05/15/2023)  Current Outpatient Medications (Respiratory):    albuterol  (VENTOLIN  HFA) 108 (90 Base) MCG/ACT inhaler, Inhale into the lungs.   fluticasone  (FLONASE ) 50 MCG/ACT nasal spray, as needed.  Current Outpatient Medications (Analgesics):    EMGALITY 120 MG/ML SOAJ, Inject 1 mL into the skin every 30 (thirty) days.   Current Outpatient Medications (Other):    amphetamine-dextroamphetamine (ADDERALL) 20 MG tablet, Take by mouth. (Patient not taking: Reported on 05/15/2023)   buPROPion (WELLBUTRIN XL) 300 MG 24 hr tablet, TAKE 1 TABLET(300 MG) BY MOUTH DAILY   CALCIUM  PO, Take by mouth 2 (two) times daily.   dicyclomine (BENTYL) 10 MG capsule, Take by mouth. (Patient not taking: Reported on 05/15/2023)   FLUoxetine (PROZAC) 40 MG capsule, Take 40 mg by mouth daily.   hydroxychloroquine  (PLAQUENIL ) 200 MG tablet, TAKE 2 TABLETS(400 MG) BY MOUTH DAILY   JORNAY PM 60 MG CP24, Take 1 capsule by mouth at bedtime.   LORazepam  (ATIVAN ) 1 MG tablet, Take by mouth.   LORazepam  (ATIVAN ) 1 MG tablet, Take by mouth. (Patient not taking: Reported on 05/15/2023)   Misc.  Devices MISC, Quad cane with small base for walking  DX: limited mobility, connect tissue disease, fibromyalgia   Fax to [facility]   Multiple Vitamin (MULTIVITAMIN) tablet, Take 1 tablet by mouth daily.   Multiple Vitamins-Minerals (HAIR SKIN AND NAILS FORMULA PO), Take by mouth.   ondansetron  (ZOFRAN -ODT) 8 MG disintegrating tablet, Take by mouth.   pantoprazole  (PROTONIX ) 40 MG tablet, Take 40 mg by mouth 2 (two) times daily.   pregabalin (LYRICA) 100 MG capsule, Take by mouth.   tiZANidine (ZANAFLEX) 4 MG tablet, Take 4 mg by mouth at bedtime.   valACYclovir (VALTREX) 1000 MG tablet, Take 1,000 mg by mouth daily.   valACYclovir (VALTREX) 500 MG tablet, Take by mouth.   Vitamin D , Ergocalciferol , (DRISDOL) 1.25 MG (50000 UNIT) CAPS capsule, Take 50,000 Units by mouth once a week.   Reviewed prior external information including notes and imaging from  primary care provider As well as notes that were available from care everywhere and other healthcare systems.  Past medical history, social, surgical and family history all reviewed in electronic medical record.  No pertanent information unless stated regarding to the chief complaint.   Review of Systems:  No headache, visual changes, nausea, vomiting, diarrhea, constipation, dizziness, abdominal pain, skin rash, fevers, chills, night sweats, weight loss, swollen lymph nodes, body aches, joint swelling, chest pain, shortness of breath, mood changes. POSITIVE muscle aches  Objective  Last menstrual period 01/04/2014.   General: No apparent distress alert and oriented x3 mood and affect normal, dressed appropriately.  HEENT: Pupils equal, extraocular movements intact  Respiratory: Patient's speak in full sentences and does not appear short of breath  Cardiovascular: No lower extremity edema, non tender, no erythema      Impression and Recommendations:     The above documentation has been reviewed and is accurate and complete  Sanjit Mcmichael M Voshon Petro, DO

## 2023-12-10 ENCOUNTER — Ambulatory Visit: Admitting: Family Medicine

## 2023-12-18 NOTE — Progress Notes (Deleted)
 Office Visit Note  Patient: [name] [name]             Date of Birth: 1990/07/08           MRN: [id number]             PCP: Vaughn Lauraine KATHEE DEVONNA Referring: Vaughn Lauraine KATHEE DEVONNA Visit Date: 01/01/2024   Subjective:  No chief complaint on file.   History of Present Illness: [name] [name] is a 33 y.o. 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    Previous HPI 05/15/2023 [name] [name] is a 33 y.o. 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    She experienced a recent flare up of symptoms, characterized by a low-grade fever and facial rash. Subsided since about 1-2 weeks ago. There is increased dryness in her eyes and mouth, which she finds particularly bothersome. She manages these symptoms with lozenges and Biotene products. No recent major illnesses requiring antibiotics have occurred.   Significant joint pain, especially in the right hip, is present, which she describes as severe. A recent flare-up left her bedridden for a week. Pain is most pronounced in her hands, hip, and other joints, worsening with movement and building up throughout the day. Her history of joint hypermobility and connective tissue disease is believed to contribute to these symptoms. Due to a gastric sleeve surgery, she cannot take NSAIDs and instead receives Toradol  and cortisone injections every six to eight weeks. She also uses tizanidine sparingly due to its sedative effects.   She has lost about 20 pounds since her last visit six months ago, attributing this to weight loss efforts and starting Lyrica, which has alleviated some pain and muscular issues. She switched from nortriptyline to Pamelor about a year ago and continues to take Plaquenil   daily.   An eye exam for Plaquenil  monitoring is scheduled in two weeks. She has been unable to engage in physical therapy due to a recent flare-up and personal circumstances, including a fall and family illnesses.     Previous HPI 09/19/2022 [name] [name] is a 33 y.o. 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  She was previously on hydroxychloroquine  but discontinued medication for perioperative management prior to laparoscopic gastric sleeve surgery on April 10.  She has remained off the medication since that time.  Has some associated constipation after the surgery no diarrhea issues.  Has persistent body aches widespread not sure which parts specifically worse related to the medication change.  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  Prior to her surgery had multiple upper respiratory infection with antibiotic treatment for apparently strep throat twice.   Previous HPI 03/20/22 [name] [name] is a 33 y.o. 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  Still on the nortriptyline 10 mg and the tizanidine 4 mg as needed for fibromyalgia and myofascial pain.  Overall she has been doing fairly well did have a flareup of increase symptoms around July and August.  During this time with stiffness joint pain and a lot more skin rashes.  She had  a skin biopsy with dermatology findings consistent with eczema there is also some concern for an inflammatory dermatitis somewhat nonspecific but thought likely related to connective tissue disease.  Looks like this is treated with addition of topical steroid not on new long-term systemic medication.  She is working with physical therapy they have expressed concern for joint hypermobility particularly involving her  bilateral knees.  Discussed this being a possible contributor to her ongoing joint pain and stiffness and recommended discussion at our follow-up.  She is seeing bariatric clinic currently working with dietitian trying to work on her exercise and diet in anticipation of bariatric surgery next year.  Currently remains very limited in the exercise component because of worsened pain after exertion.  Likely aiming for bariatric surgery in January February next year.   Previous HPI 09/14/21 [name] [name] is a 33 y.o. 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 Around the time of our last visit she started to feel better with an increase in physical activity including some hiking. She developed severe migraine headache associated with nausea and vomiting and went to the ED on 3/17 for this problem. She was treated with migraine cocktail but symptoms remained and eventually improved at home after about 2 more days. She was seen to have a new erythematous rash at her right hip not similar to any previous skin involvement. She was prescribed topical and systemic steroids took about 2 days of prednisone  in total. She had skin punch biopsy on 3/23 this was read as somewhat nonspecific but suggestive for eczematous rash. This whole series of events somewhat resolved on its own but she continues to have a lot of generalized muscular and joint pain. She started on nortriptyline for FMS and feels her fatigue is partially improved but the neck, back, and hip pains remain an issue.   Previous HPI 06/08/2021 [name] [name] is a 33 y.o. female here for follow up for UCTD with FMS on HCQ 400 mg daily after last visit recommended adding meloxicam  15 mg daily for increased left shoulder pain no extensive workup at that time. She also had increase in rash and swelling around margins of her old tattoos coming and going. The shoulder and hip pain are greatly improved with the meloxicam . She has not noticed any side  effect or intolerance with the medication either. Her skin rash around the tattoos is better but she has increased redness and irritation on her face and torso and arms. She also feels very fatigued worse than usual with this.   Previous HPI 05/09/21 ANALEIGHA NAUMAN is a 33 y.o. 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  She was doing overall okay has been worse since she got sick with pneumonia and required antibiotics treatment.  First she developed severe worsening of her facial rash with warm and hot sensation in the area.  She experienced worsening joint pain in the left shoulder and some worsening of her right hip although that was chronically painful.  Besides joint pain she noticed some swelling around the margins of numerous skin tattoos this was coming and going.  She felt there is increase in joint pain near areas where she was seeing the tattoo swelling.    Previous HPI: 08/17/20 PRETTY WELTMAN is a 33 y.o. female here for follow up for fibromyalgia and UCTD on hydroxychloroquine  but with worsening of overall symptoms since the end of March. She  has been experiencing increased pain all over but most problematic are in her bilateral arms distal to the elbows and in her hands. This is causing increased pain overnight and when using her hands. Symptoms are severe enough to prevent her ability to work currently with numbness and difficulty strongly gripping items. In addition she has worse fatigue constantly. She does not recall any particular event or change in situation before these increased symptoms   No Rheumatology ROS completed.   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   Neck pain 09/14/2021   Pain in left shoulder 05/09/2021   Pain in right hip 05/09/2021   Chronic eczematous otitis externa of both ears 12/13/2020   Obesity 12/06/2020   Sleep disturbance  08/27/2020   Undifferentiated connective tissue disease (HCC) 08/17/2020   Bilateral hand pain 08/17/2020   Fibromyalgia 05/10/2020   Facial rash 04/26/2020   Elevated sedimentation rate 03/30/2020   Mixed hyperlipidemia 11/03/2019   Polyarthralgia 11/03/2019   Vitamin D  deficiency 09/09/2019   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   GAD (generalized anxiety disorder) 09/11/2018   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   Allergic reactions 04/06/2015   Chronic rhinitis 04/06/2015   Elevated blood pressure reading without diagnosis of hypertension 04/06/2015   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   Hot flashes due to surgical menopause 01/26/2015   S/P hysterectomy 06/17/2014   Pelvic pain in female 01/23/2014   Contraception management 03/31/2013   AKI (acute kidney injury) (HCC) 01/04/2013   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   Hydronephrosis, right 12/30/2012   Pleuritic chest pain 12/30/2012   Pneumonia 12/30/2012   Primary dysmenorrhea 03/07/2012    Past Medical History:  Diagnosis Date   Anemia    Anxiety    from OCD   Arthritis    knee, ankle, left wrist   Chronic pain    muscle spasms- entire body, pinch nerve in upper back   Depression    doing fine now   Fibromyalgia    Per patient   Fracture, ankle    left- x2 - resolved   Headache(784.0)    otc med prn   Heartburn    zantac prn   History of blood transfusion 12/2012   at [facility] - 1 unit transfused   Hydronephrosis    History   Infection    UTI -Resolved - History only   OCD (obsessive compulsive disorder)    Polyarthralgia    Per patient   PONV (postoperative nausea and vomiting)    Post concussive syndrome    Post traumatic stress disorder (PTSD)    SVD (spontaneous vaginal delivery) 12/2012   x 1 at [facility]   Tachycardia    Per patient    Family History  Problem Relation Age of Onset   Heart disease Mother    Breast cancer Mother     Lupus Mother    Sjogren's syndrome Mother    Hypertension Father    Cancer Paternal Grandmother        lung   Heart disease Paternal Grandmother    Breast cancer Maternal Aunt    Breast cancer Maternal Grandmother    Raynaud syndrome Sister    Lupus Sister    Raynaud syndrome Sister    Past Surgical History:  Procedure Laterality Date   CYSTOSCOPY WITH STENT PLACEMENT Right 12/25/2012  Procedure: CYSTOSCOPY WITH STENT PLACEMENT;  Surgeon: Garnette Shack, MD;  Location: [facility];  Service: Urology;  Laterality: Right;   LAPAROSCOPIC ASSISTED VAGINAL HYSTERECTOMY N/A 06/17/2014   Procedure: LAPAROSCOPIC ASSISTED VAGINAL HYSTERECTOMY;  Surgeon: Krystal Deaner, MD;  Location: [facility];  Service: Gynecology;  Laterality: N/A;   LAPAROSCOPIC BILATERAL SALPINGECTOMY Bilateral 03/31/2013   Procedure: LAPAROSCOPIC BILATERAL SALPINGECTOMY;  Surgeon: Krystal Deaner, MD;  Location: [facility];  Service: Gynecology;  Laterality: Bilateral;   LAPAROSCOPY N/A 01/23/2014   Procedure: LAPAROSCOPY DIAGNOSTIC RIGHT PERITONEAL BIOPSY;  Surgeon: Krystal Deaner, MD;  Location: [facility];  Service: Gynecology;  Laterality: N/A;   nephrostomy tube placement at 25wks and removed at 335/7 Right    OOPHORECTOMY Bilateral 06/17/2014   Procedure: OOPHORECTOMY;  Surgeon: Krystal Deaner, MD;  Location: [facility];  Service: Gynecology;  Laterality: Bilateral;   OTHER SURGICAL HISTORY  07/26/2022   Gastric Sleeve   TUBAL LIGATION     VIDEO ASSISTED THORACOSCOPY (VATS)/DECORTICATION Right 01/08/2013   Procedure: VIDEO ASSISTED THORACOSCOPY DECORTICATION; Drainage of Pleural Effusion;  Surgeon: Elspeth JAYSON Millers, MD;  Location[facility];  Service: Thoracic;  Laterality: Right;   WISDOM TOOTH EXTRACTION  06/15/2013   Social History   Social History Narrative   Not on file   Immunization History  Administered Date(s) Administered   HPV Quadrivalent 05/09/2007, 09/02/2007   Influenza Split 02/24/2009   PFIZER  Comirnaty(Gray Top)Covid-19 Tri-Sucrose Vaccine 08/27/2020   PFIZER(Purple Top)SARS-COV-2 Vaccination 07/10/2019, 08/04/2019   Tdap 10/14/2008, 11/07/2012     Objective: Vital Signs: LMP 01/04/2014    Physical Exam   Musculoskeletal Exam: ***  CDAI Exam: CDAI Score: -- Patient Global: --; Provider Global: -- Swollen: --; Tender: -- Joint Exam 01/01/2024   No joint exam has been documented for this visit   There is currently no information documented on the homunculus. Go to the Rheumatology activity and complete the homunculus joint exam.  Investigation: No additional findings.  Imaging: No results found.  Recent Labs: Lab Results  Component Value Date   WBC 6.5 05/15/2023   HGB 12.6 05/15/2023   PLT 278 05/15/2023   NA 141 05/15/2023   K 4.2 05/15/2023   CL 103 05/15/2023   CO2 30 05/15/2023   GLUCOSE 83 05/15/2023   BUN 16 05/15/2023   CREATININE 0.88 05/15/2023   BILITOT 0.2 03/20/2022   ALKPHOS 91 11/07/2016   AST 14 03/20/2022   ALT 21 03/20/2022   PROT 6.8 03/20/2022   ALBUMIN 4.5 11/07/2016   CALCIUM  9.8 05/15/2023   GFRAA 111 06/07/2020    Speciality Comments: PL[facility] Eye Exam 10/06/2020 Normal [facility] f/u 1 year 10/06/2021 appt  Procedures:  No procedures performed Allergies: Amoxicillin-pot clavulanate, Baclofen, Cinnamon, Nickel, Other, Shellfish allergy, Duloxetine hcl, Gabapentin, Hydroxyzine , Metformin and related, Prednisone , Zoloft [sertraline], Phenergan  [promethazine  hcl], and Promethazine    Assessment / Plan:     Visit Diagnoses: No diagnosis found.  ***  Orders: No orders of the defined types were placed in this encounter.  No orders of the defined types were placed in this encounter.    Follow-Up Instructions: No follow-ups on file.   Charnice Zwilling M Manali Mcelmurry, CMA  Note - This record has been created using Animal nutritionist.  Chart creation errors have been sought, but may not always  have been located. Such creation errors  do not reflect on  the standard of medical care.

## 2024-01-01 ENCOUNTER — Ambulatory Visit: Admitting: Internal Medicine

## 2024-01-01 DIAGNOSIS — M359 Systemic involvement of connective tissue, unspecified: Secondary | ICD-10-CM

## 2024-01-01 DIAGNOSIS — Z79899 Other long term (current) drug therapy: Secondary | ICD-10-CM

## 2024-01-01 DIAGNOSIS — M797 Fibromyalgia: Secondary | ICD-10-CM

## 2024-01-09 NOTE — Progress Notes (Deleted)
 [name] [address] [place] [address] Phone: [phone] Subjective:    I'm seeing this patient by the request  of:  [name] [name]  CC:   YEP:Dlagzrupcz  [name] [name] is a 33 y.o. female coming in with complaint of hypermobility. Patient states      Past Medical History:  Diagnosis Date   Anemia    Anxiety    from OCD   Arthritis    knee, ankle, left wrist   Chronic pain    muscle spasms- entire body, pinch nerve in upper back   Depression    doing fine now   Fibromyalgia    Per patient   Fracture, ankle    left- x2 - resolved   Headache(784.0)    otc med prn   Heartburn    zantac prn   History of blood transfusion 12/2012   at [facility] - 1 unit transfused   Hydronephrosis    History   Infection    UTI -Resolved - History only   OCD (obsessive compulsive disorder)    Polyarthralgia    Per patient   PONV (postoperative nausea and vomiting)    Post concussive syndrome    Post traumatic stress disorder (PTSD)    SVD (spontaneous vaginal delivery) 12/2012   x 1 at [facility]   Tachycardia    Per patient   Past Surgical History:  Procedure Laterality Date   CYSTOSCOPY WITH STENT PLACEMENT Right 12/25/2012   Procedure: CYSTOSCOPY WITH STENT PLACEMENT;  Surgeon: Garnette Shack, MD;  Location: [facility];  Service: Urology;  Laterality: Right;   LAPAROSCOPIC ASSISTED VAGINAL HYSTERECTOMY N/A 06/17/2014   Procedure: LAPAROSCOPIC ASSISTED VAGINAL HYSTERECTOMY;  Surgeon: Krystal Deaner, MD;  Location: [facility];  Service: Gynecology;  Laterality: N/A;   LAPAROSCOPIC BILATERAL SALPINGECTOMY Bilateral 03/31/2013   Procedure: LAPAROSCOPIC BILATERAL SALPINGECTOMY;  Surgeon: Krystal Deaner, MD;  Location: [facility];  Service: Gynecology;  Laterality: Bilateral;   LAPAROSCOPY N/A 01/23/2014   Procedure: LAPAROSCOPY DIAGNOSTIC RIGHT PERITONEAL BIOPSY;  Surgeon: Krystal Deaner, MD;  Location: [facility];  Service: Gynecology;   Laterality: N/A;   nephrostomy tube placement at 25wks and removed at 335/7 Right    OOPHORECTOMY Bilateral 06/17/2014   Procedure: OOPHORECTOMY;  Surgeon: Krystal Deaner, MD;  Location: [facility];  Service: Gynecology;  Laterality: Bilateral;   OTHER SURGICAL HISTORY  07/26/2022   Gastric Sleeve   TUBAL LIGATION     VIDEO ASSISTED THORACOSCOPY (VATS)/DECORTICATION Right 01/08/2013   Procedure: VIDEO ASSISTED THORACOSCOPY DECORTICATION; Drainage of Pleural Effusion;  Surgeon: Elspeth JAYSON Millers, MD;  Location: [facility];  Service: Thoracic;  Laterality: Right;   WISDOM TOOTH EXTRACTION  06/15/2013   Social History   Socioeconomic History   Marital status: Single    Spouse name: Not on file   Number of children: Not on file   Years of education: Not on file   Highest education level: Not on file  Occupational History   Not on file  Tobacco Use   Smoking status: Former    Current packs/day: 0.00    Average packs/day: 0.3 packs/day for 9.0 years (2.3 ttl pk-yrs)    Types: Cigarettes    Start date: 04/13/2007    Quit date: 04/12/2016    Years since quitting: 7.7    Passive exposure: Current   Smokeless tobacco: Never  Vaping Use   Vaping status: Every Day  Substance and Sexual Activity   Alcohol use: Not Currently  Drug use: No   Sexual activity: Not on file  Other Topics Concern   Not on file  Social History Narrative   Not on file   Social Drivers of Health   Financial Resource Strain: Medium Risk (08/01/2023)   Received from [facility]   Overall Financial Resource Strain (CARDIA)    Difficulty of Paying Living Expenses: Somewhat hard  Food Insecurity: Patient Declined (08/01/2023)   Received from [facility]   Hunger Vital Sign    Within the past 12 months, you worried that your food would run out before you got the money to buy more.: Patient declined    Within the past 12 months, the food you bought just didn't last and you didn't have money to get more.: Patient  declined  Transportation Needs: Patient Declined (08/01/2023)   Received from [facility] - Transportation    Lack of Transportation (Medical): Patient declined    Lack of Transportation (Non-Medical): Patient declined  Physical Activity: Unknown (08/01/2023)   Received from [facility]   Exercise Vital Sign    On average, how many days per week do you engage in moderate to strenuous exercise (like a brisk walk)?: Patient declined    Minutes of Exercise per Session: Not on file  Stress: Patient Declined (08/01/2023)   Received from [facility] - Occupational Stress Questionnaire    Feeling of Stress : Patient declined  Social Connections: Patient Declined (08/01/2023)   Received from [facility]   Social Network    How would you rate your social network (family, work, friends)?: Patient declined   Allergies  Allergen Reactions   Amoxicillin-Pot Clavulanate Nausea And Vomiting, Rash and Nausea Only   Baclofen Itching    Baclofen, patient went to experience pruritus, somnolence Baclofen, patient went to experience pruritus, somnolence Baclofen, patient went to experience pruritus, somnolence   Cinnamon Anaphylaxis and Hives   Nickel Rash, Hives and Swelling    swelling    Other Itching, Rash, Shortness Of Breath and Swelling    Chick Pea   Shellfish Allergy Anaphylaxis   Duloxetine Hcl Other (See Comments)    Made depression worse Other reaction(s): Other (See Comments) Made depression worse Made depression worse   Gabapentin Other (See Comments)    Somnolence   Hydroxyzine  Other (See Comments)    Caused worsened anxiety symptoms Other reaction(s): Other (See Comments) Made anxiety worse Caused worsened anxiety symptoms   Metformin And Related Other (See Comments)    Bad taste in mouth   Prednisone  Other (See Comments)    Irritation    Zoloft [Sertraline] Other (See Comments)    Made depression worse    Phenergan  [Promethazine  Hcl]     vomiting   Promethazine  Nausea And Vomiting   Family History  Problem Relation Age of Onset   Heart disease Mother    Breast cancer Mother    Lupus Mother    Sjogren's syndrome Mother    Hypertension Father    Cancer Paternal Grandmother        lung   Heart disease Paternal Grandmother    Breast cancer Maternal Aunt    Breast cancer Maternal Grandmother    Raynaud syndrome Sister    Lupus Sister    Raynaud syndrome Sister      Current Outpatient Medications (Cardiovascular):    propranolol ER (INDERAL LA) 60 MG 24 hr capsule, Take 60 mg by mouth daily.   rosuvastatin (CRESTOR) 10  MG tablet, Take 10 mg by mouth at bedtime. (Patient not taking: Reported on 05/15/2023)  Current Outpatient Medications (Respiratory):    albuterol  (VENTOLIN  HFA) 108 (90 Base) MCG/ACT inhaler, Inhale into the lungs.   fluticasone  (FLONASE ) 50 MCG/ACT nasal spray, as needed.  Current Outpatient Medications (Analgesics):    EMGALITY 120 MG/ML SOAJ, Inject 1 mL into the skin every 30 (thirty) days.   Current Outpatient Medications (Other):    amphetamine-dextroamphetamine (ADDERALL) 20 MG tablet, Take by mouth. (Patient not taking: Reported on 05/15/2023)   buPROPion (WELLBUTRIN XL) 300 MG 24 hr tablet, TAKE 1 TABLET(300 MG) BY MOUTH DAILY   CALCIUM  PO, Take by mouth 2 (two) times daily.   dicyclomine (BENTYL) 10 MG capsule, Take by mouth. (Patient not taking: Reported on 05/15/2023)   FLUoxetine (PROZAC) 40 MG capsule, Take 40 mg by mouth daily.   hydroxychloroquine  (PLAQUENIL ) 200 MG tablet, TAKE 2 TABLETS(400 MG) BY MOUTH DAILY   JORNAY PM 60 MG CP24, Take 1 capsule by mouth at bedtime.   LORazepam  (ATIVAN ) 1 MG tablet, Take by mouth.   LORazepam  (ATIVAN ) 1 MG tablet, Take by mouth. (Patient not taking: Reported on 05/15/2023)   Misc. Devices MISC, Quad cane with small base for walking  DX: limited mobility, connect tissue disease, fibromyalgia   Fax to [facility]  medical supply   Multiple Vitamin (MULTIVITAMIN) tablet, Take 1 tablet by mouth daily.   Multiple Vitamins-Minerals (HAIR SKIN AND NAILS FORMULA PO), Take by mouth.   ondansetron  (ZOFRAN -ODT) 8 MG disintegrating tablet, Take by mouth.   pantoprazole  (PROTONIX ) 40 MG tablet, Take 40 mg by mouth 2 (two) times daily.   pregabalin (LYRICA) 100 MG capsule, Take by mouth.   tiZANidine (ZANAFLEX) 4 MG tablet, Take 4 mg by mouth at bedtime.   valACYclovir (VALTREX) 1000 MG tablet, Take 1,000 mg by mouth daily.   valACYclovir (VALTREX) 500 MG tablet, Take by mouth.   Vitamin D , Ergocalciferol , (DRISDOL) 1.25 MG (50000 UNIT) CAPS capsule, Take 50,000 Units by mouth once a week.   Reviewed prior external information including notes and imaging from  primary care provider As well as notes that were available from care everywhere and other healthcare systems.  Past medical history, social, surgical and family history all reviewed in electronic medical record.  No pertanent information unless stated regarding to the chief complaint.   Review of Systems:  No headache, visual changes, nausea, vomiting, diarrhea, constipation, dizziness, abdominal pain, skin rash, fevers, chills, night sweats, weight loss, swollen lymph nodes, body aches, joint swelling, chest pain, shortness of breath, mood changes. POSITIVE muscle aches  Objective  Last menstrual period 01/04/2014.   General: No apparent distress alert and oriented x3 mood and affect normal, dressed appropriately.  HEENT: Pupils equal, extraocular movements intact  Respiratory: Patient's speak in full sentences and does not appear short of breath  Cardiovascular: No lower extremity edema, non tender, no erythema      Impression and Recommendations:

## 2024-01-10 ENCOUNTER — Ambulatory Visit: Admitting: Family Medicine

## 2024-02-25 NOTE — Progress Notes (Signed)
 Office Visit Note  Patient: [name] [name]             Date of Birth: 20-Jun-1990           MRN: [id number]             PCP: Vaughn Lauraine KATHEE DEVONNA Referring: Vaughn Lauraine KATHEE DEVONNA Visit Date: 03/10/2024   Subjective:  Medication Management (POTs symptoms )  Discussed the use of AI scribe software for clinical note transcription with the patient, who gave verbal consent to proceed.  History of Present Illness   [name] [name] is a 33 y.o. 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  Currently with increased problems related to POTS and right hip pain.  She experiences significant hip pain, particularly on the right side this started after a popping sound and sensation during activity. There is a history of chronic issues with this hip, with sensations of it 'popping out of place' for years. The pain is deep and exacerbated by certain movements, causing shooting pain when sitting up.  Her symptoms of Postural Orthostatic Tachycardia Syndrome (POTS) have worsened, including palpitations, lightheadedness, dizziness, and severe nausea upon standing. She uses a cane at home to assist with mobility due to these symptoms. She also feels constantly dehydrated, which she associates with dry eyes and mouth.  She has been experiencing more frequent fibromyalgia flare-ups, with increased tenderness and pain in certain areas, particularly in the upper body. She continues to take hydroxychloroquine  and Lyrica, with a recent increase in Lyrica dosage from 50 mg twice daily to 300 mg due to persistent pain.  She was recently diagnosed with cluster headache syndrome and has started new medication for this condition. No major changes in other medications aside from the adjustment in Lyrica dosage.  She mentions that her rashes have been more flared up than usual, although  she does not have any visible rashes at the moment. The rashes often occur on her tattooed arm areas. No current rashes, but previous flare-ups noted.       Previous HPI 05/15/2023 [name] [name] is a 33 y.o. 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    She experienced a recent flare up of symptoms, characterized by a low-grade fever and facial rash. Subsided since about 1-2 weeks ago. There is increased dryness in her eyes and mouth, which she finds particularly bothersome. She manages these symptoms with lozenges and Biotene products. No recent major illnesses requiring antibiotics have occurred.   Significant joint pain, especially in the right hip, is present, which she describes as severe. A recent flare-up left her bedridden for a week. Pain is most pronounced in her hands, hip, and other joints, worsening with movement and building up throughout the day. Her history of joint hypermobility and connective tissue disease is believed to contribute to these symptoms. Due to a gastric sleeve surgery, she cannot take NSAIDs and instead receives Toradol  and cortisone injections every six to eight weeks. She also uses tizanidine sparingly due to its sedative effects.   She has lost about 20 pounds since her last visit six months ago, attributing this to weight loss efforts and starting Lyrica, which has alleviated some pain and muscular issues. She switched from nortriptyline to Pamelor about a year ago and continues to take  Plaquenil  daily.   An eye exam for Plaquenil  monitoring is scheduled in two weeks. She has been unable to engage in physical therapy due to a recent flare-up and personal circumstances, including a fall and family illnesses.     Previous HPI 09/19/2022 [name] [name] is a 33 y.o. female here for follow up for undifferentiated tissue disease  characterized by arthritis, rashes, and fatigue and fibromyalgia syndrome currently on nortriptyline 30 mg daily.  She was previously on hydroxychloroquine  but discontinued medication for perioperative management prior to laparoscopic gastric sleeve surgery on April 10.  She has remained off the medication since that time.  Has some associated constipation after the surgery no diarrhea issues.  Has persistent body aches widespread not sure which parts specifically worse related to the medication change.  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  Prior to her surgery had multiple upper respiratory infection with antibiotic treatment for apparently strep throat twice.   Previous HPI 03/20/22 [name] [name] is a 33 y.o. 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  Still on the nortriptyline 10 mg and the tizanidine 4 mg as needed for fibromyalgia and myofascial pain.  Overall she has been doing fairly well did have a flareup of increase symptoms around July and August.  During this time with stiffness joint pain and a lot more skin rashes.  She had a skin biopsy with dermatology findings consistent with eczema there is also some concern for an inflammatory dermatitis somewhat nonspecific but thought likely related to connective tissue disease.  Looks like this is treated with addition of topical steroid not on new long-term systemic medication.  She is working with physical therapy they have expressed concern for joint hypermobility particularly involving her bilateral knees.  Discussed this being a possible contributor to her ongoing joint pain and stiffness and recommended discussion at our follow-up.  She is seeing bariatric clinic currently working with dietitian trying to work on her exercise and diet in anticipation of bariatric surgery next year.  Currently remains very limited in the  exercise component because of worsened pain after exertion.  Likely aiming for bariatric surgery in January February next year.   Previous HPI 09/14/21 [name] [name] is a 33 y.o. 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 Around the time of our last visit she started to feel better with an increase in physical activity including some hiking. She developed severe migraine headache associated with nausea and vomiting and went to the ED on 3/17 for this problem. She was treated with migraine cocktail but symptoms remained and eventually improved at home after about 2 more days. She was seen to have a new erythematous rash at her right hip not similar to any previous skin involvement. She was prescribed topical and systemic steroids took about 2 days of prednisone  in total. She had skin punch biopsy on 3/23 this was read as somewhat nonspecific but suggestive for eczematous rash. This whole series of events somewhat resolved on its own but she continues to have a lot of generalized muscular and joint pain. She started on nortriptyline for FMS and feels her fatigue is partially improved but the neck, back, and hip pains remain an issue.   Previous HPI 06/08/2021 [name] [name] is a 33 y.o. female here for follow up for UCTD with  FMS on HCQ 400 mg daily after last visit recommended adding meloxicam  15 mg daily for increased left shoulder pain no extensive workup at that time. She also had increase in rash and swelling around margins of her old tattoos coming and going. The shoulder and hip pain are greatly improved with the meloxicam . She has not noticed any side effect or intolerance with the medication either. Her skin rash around the tattoos is better but she has increased redness and irritation on her face and torso and arms. She also feels very fatigued worse than usual with this.   Previous HPI 05/09/21 [name] [name] is a 33 y.o. female here for follow up with UCTD and FMS on HCQ 400 mg  daily.  She was doing overall okay has been worse since she got sick with pneumonia and required antibiotics treatment.  First she developed severe worsening of her facial rash with warm and hot sensation in the area.  She experienced worsening joint pain in the left shoulder and some worsening of her right hip although that was chronically painful.  Besides joint pain she noticed some swelling around the margins of numerous skin tattoos this was coming and going.  She felt there is increase in joint pain near areas where she was seeing the tattoo swelling.    Previous HPI: 08/17/20 [name] [name] is a 33 y.o. female here for follow up for fibromyalgia and UCTD on hydroxychloroquine  but with worsening of overall symptoms since the end of March. She has been experiencing increased pain all over but most problematic are in her bilateral arms distal to the elbows and in her hands. This is causing increased pain overnight and when using her hands. Symptoms are severe enough to prevent her ability to work currently with numbness and difficulty strongly gripping items. In addition she has worse fatigue constantly. She does not recall any particular event or change in situation before these increased symptoms     Review of Systems  Constitutional:  Positive for fatigue.  HENT:  Positive for mouth sores and mouth dryness.   Eyes:  Positive for dryness.  Respiratory:  Positive for shortness of breath.   Cardiovascular:  Positive for palpitations. Negative for chest pain.  Gastrointestinal:  Positive for diarrhea. Negative for blood in stool and constipation.  Endocrine: Negative for increased urination.  Genitourinary:  Negative for involuntary urination.  Musculoskeletal:  Positive for joint pain, gait problem, joint pain, joint swelling, myalgias, muscle weakness, morning stiffness, muscle tenderness and myalgias.  Skin:  Positive for color change, rash and sensitivity to sunlight. Negative for hair loss.   Allergic/Immunologic: Positive for susceptible to infections.  Neurological:  Positive for dizziness and headaches.  Hematological:  Positive for swollen glands.  Psychiatric/Behavioral:  Positive for depressed mood and sleep disturbance. The patient is nervous/anxious.     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   Neck pain 09/14/2021   Pain in left shoulder 05/09/2021   Pain in right hip 05/09/2021   Chronic eczematous otitis externa of both ears 12/13/2020   Obesity 12/06/2020   Sleep disturbance 08/27/2020   Undifferentiated connective tissue disease 08/17/2020   Bilateral hand pain 08/17/2020   Fibromyalgia 05/10/2020   Facial rash 04/26/2020   Elevated sedimentation rate 03/30/2020   Mixed hyperlipidemia 11/03/2019   Polyarthralgia 11/03/2019   Vitamin D  deficiency 09/09/2019   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   GAD (generalized anxiety disorder) 09/11/2018   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   Allergic reactions 04/06/2015   Chronic rhinitis 04/06/2015   Elevated blood pressure reading without diagnosis of hypertension 04/06/2015   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   Hot flashes due to surgical menopause 01/26/2015   S/P hysterectomy 06/17/2014   Pelvic pain in female 01/23/2014   Contraception management 03/31/2013   AKI (acute kidney injury) 01/04/2013   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   Hydronephrosis, right 12/30/2012   Pleuritic chest pain 12/30/2012   Pneumonia 12/30/2012   Primary dysmenorrhea 03/07/2012    Past Medical History:  Diagnosis Date   Anemia    Anxiety    from OCD   Arthritis    knee, ankle, left wrist   Chronic pain    muscle spasms- entire body, pinch nerve in upper back   Depression    doing fine now   Fibromyalgia    Per patient   Fracture, ankle    left- x2 - resolved    Headache(784.0)    otc med prn   Heartburn    zantac prn   History of blood transfusion 12/2012   at [facility] - 1 unit transfused   Hydronephrosis    History   Infection    UTI -Resolved - History only   OCD (obsessive compulsive disorder)    Polyarthralgia    Per patient   PONV (postoperative nausea and vomiting)    Post concussive syndrome    Post traumatic stress disorder (PTSD)    SVD (spontaneous vaginal delivery) 12/2012   x 1 at [facility]   Tachycardia    Per patient    Family History  Problem Relation Age of Onset   Heart disease Mother    Breast cancer Mother    Lupus Mother    Sjogren's syndrome Mother    Hypertension Father    Cancer Paternal Grandmother        lung   Heart disease Paternal Grandmother    Breast cancer Maternal Aunt    Breast cancer Maternal Grandmother    Raynaud syndrome Sister    Lupus Sister    Raynaud syndrome Sister    Past Surgical History:  Procedure Laterality Date   CYSTOSCOPY WITH STENT PLACEMENT Right 12/25/2012   Procedure: CYSTOSCOPY WITH STENT PLACEMENT;  Surgeon: Garnette Shack, MD;  Location: [facility];  Service: Urology;  Laterality: Right;   LAPAROSCOPIC ASSISTED VAGINAL HYSTERECTOMY N/A 06/17/2014   Procedure: LAPAROSCOPIC ASSISTED VAGINAL HYSTERECTOMY;  Surgeon: Krystal Deaner, MD;  Location: [facility];  Service: Gynecology;  Laterality: N/A;   LAPAROSCOPIC BILATERAL SALPINGECTOMY Bilateral 03/31/2013   Procedure: LAPAROSCOPIC BILATERAL SALPINGECTOMY;  Surgeon: Krystal Deaner, MD;  Location: [facility];  Service: Gynecology;  Laterality: Bilateral;   LAPAROSCOPY N/A 01/23/2014   Procedure: LAPAROSCOPY DIAGNOSTIC RIGHT PERITONEAL BIOPSY;  Surgeon: Krystal Deaner, MD;  Location: [facility];  Service: Gynecology;  Laterality: N/A;   nephrostomy tube placement at 25wks and removed at 335/7 Right    OOPHORECTOMY Bilateral 06/17/2014   Procedure: OOPHORECTOMY;  Surgeon: Krystal Deaner, MD;  Location: [facility];  Service: Gynecology;   Laterality: Bilateral;   OTHER SURGICAL HISTORY  07/26/2022   Gastric Sleeve   TUBAL LIGATION     VIDEO ASSISTED THORACOSCOPY (VATS)/DECORTICATION Right 01/08/2013   Procedure: VIDEO ASSISTED THORACOSCOPY DECORTICATION; Drainage of Pleural Effusion;  Surgeon: Elspeth JAYSON Millers, MD;  Location: [facility] OR;  Service: Thoracic;  Laterality: Right;  WISDOM TOOTH EXTRACTION  06/15/2013   Social History   Social History Narrative   Not on file   Immunization History  Administered Date(s) Administered   HPV Quadrivalent 05/09/2007, 09/02/2007   Influenza Split 02/24/2009   PFIZER Comirnaty(Gray Top)Covid-19 Tri-Sucrose Vaccine 08/27/2020   PFIZER(Purple Top)SARS-COV-2 Vaccination 07/10/2019, 08/04/2019   Pfizer(Comirnaty)Fall Seasonal Vaccine 12 years and older 01/22/2024   Tdap 10/14/2008, 11/07/2012     Objective: Vital Signs: BP 117/76   Pulse 75   Temp 97.6 F (36.4 C)   Resp 16   Ht 5' 3 (1.6 m)   Wt 233 lb 6.4 oz (105.9 kg)   LMP 01/04/2014   BMI 41.34 kg/m    Physical Exam Constitutional:      Appearance: She is obese.  Eyes:     Conjunctiva/sclera: Conjunctivae normal.  Cardiovascular:     Rate and Rhythm: Normal rate and regular rhythm.  Pulmonary:     Effort: Pulmonary effort is normal.     Breath sounds: Normal breath sounds.  Lymphadenopathy:     Cervical: No cervical adenopathy.  Skin:    General: Skin is warm and dry.  Neurological:     Mental Status: She is alert.  Psychiatric:        Mood and Affect: Mood normal.      Musculoskeletal Exam:  Elbows full ROM no tenderness or swelling Wrists full ROM no tenderness or swelling Fingers full ROM no tenderness or swelling Right sided low back pain worse over SI joint Lateral hip pain with FABER movement, tenderness to pressure on right side, strength grossly normal Knees full ROM, patellar instability, no swelling    Investigation: No additional findings.  Imaging: No results found.  Recent  Labs: Lab Results  Component Value Date   WBC 7.6 03/10/2024   HGB 12.4 03/10/2024   PLT 333 03/10/2024   NA 140 03/10/2024   K 4.4 03/10/2024   CL 104 03/10/2024   CO2 28 03/10/2024   GLUCOSE 141 (H) 03/10/2024   BUN 12 03/10/2024   CREATININE 0.78 03/10/2024   BILITOT 0.2 03/20/2022   ALKPHOS 91 11/07/2016   AST 14 03/20/2022   ALT 21 03/20/2022   PROT 6.8 03/20/2022   ALBUMIN 4.5 11/07/2016   CALCIUM  9.4 03/10/2024   GFRAA 111 06/07/2020    Speciality Comments: [facility] Eye Exam 10/06/2020 Normal [facility] f/u 1 year 10/06/2021 appt  Procedures:  No procedures performed Allergies: Amoxicillin-pot clavulanate, Baclofen, Cinnamon, Nickel, Other, Shellfish allergy, Duloxetine hcl, Gabapentin, Hydroxyzine , Metformin and related, Prednisone , Zoloft [sertraline], Phenergan  [promethazine  hcl], and Promethazine    Assessment / Plan:     Visit Diagnoses: Undifferentiated connective tissue disease - Plan: Sedimentation rate Inflammatory symptoms appear well controlled. May be seeing rash at tatotteed areas from disease activity but nothing on exam today. - Checking sed rate for disease activity monitoring.  - Plan to continue HCQ 400 mg daily.   High risk medication use - hydroxychloroquine  400 mg daily. - Plan: CBC with Differential/Platelet, Basic Metabolic Panel (BMET) No serious interval infections. - Checking CBC and BMP for medication monitoring on HCQ - Needs updated eye exam for long term HCQ maintenance  Right hip pain with possible labral injury in the setting of hypermobility syndrome Chronic right hip pain likely due to hypermobility syndrome, exacerbated by external rotation and weight-bearing. Differential includes labral tear or sprain. No surgical intervention anticipated. - Avoid activities that provoke sharp pain. - Adjust positions to reduce strain on the hip, especially during external rotation. -  Consider propping up hips during sitting to reduce  strain.  Fibromyalgia Recent flare-ups causing tenderness and pain. Current management includes hydroxychloroquine  and Lyrica, with recent dose adjustments for Lyrica to 300 mg. Evidence supports higher doses for fibromyalgia management. - Continue hydroxychloroquine  and Lyrica as prescribed.  Postural orthostatic tachycardia syndrome (POTS) POTS with symptoms of palpitations, lightheadedness, dizziness, and nausea upon standing. Symptoms exacerbated by rapid position changes. Chronic condition with ongoing management challenges.        Orders: Orders Placed This Encounter  Procedures   Sedimentation rate   CBC with Differential/Platelet   Basic Metabolic Panel (BMET)   Meds ordered this encounter  Medications   hydroxychloroquine  (PLAQUENIL ) 200 MG tablet    Sig: TAKE 2 TABLETS(400 MG) BY MOUTH DAILY    Dispense:  180 tablet    Refill:  1     Follow-Up Instructions: Return in about 6 months (around 09/07/2024) for UCTD/FMS on HCQ f/u 6mos.   Lonni LELON Ester, MD  Note - This record has been created using [employer].  Chart creation errors have been sought, but may not always  have been located. Such creation errors do not reflect on  the standard of medical care.

## 2024-03-10 ENCOUNTER — Encounter: Payer: Self-pay | Admitting: Internal Medicine

## 2024-03-10 ENCOUNTER — Ambulatory Visit: Attending: Internal Medicine | Admitting: Internal Medicine

## 2024-03-10 VITALS — BP 117/76 | HR 75 | Temp 97.6°F | Resp 16 | Ht 63.0 in | Wt 233.4 lb

## 2024-03-10 DIAGNOSIS — M2550 Pain in unspecified joint: Secondary | ICD-10-CM | POA: Insufficient documentation

## 2024-03-10 DIAGNOSIS — M797 Fibromyalgia: Secondary | ICD-10-CM | POA: Diagnosis present

## 2024-03-10 DIAGNOSIS — R21 Rash and other nonspecific skin eruption: Secondary | ICD-10-CM | POA: Insufficient documentation

## 2024-03-10 DIAGNOSIS — M359 Systemic involvement of connective tissue, unspecified: Secondary | ICD-10-CM | POA: Diagnosis present

## 2024-03-10 DIAGNOSIS — Z79899 Other long term (current) drug therapy: Secondary | ICD-10-CM | POA: Diagnosis present

## 2024-03-10 MED ORDER — HYDROXYCHLOROQUINE SULFATE 200 MG PO TABS: ORAL_TABLET | ORAL | 1 refills | Status: AC

## 2024-03-11 LAB — CBC WITH DIFFERENTIAL/PLATELET
Absolute Lymphocytes: 2348 {cells}/uL (ref 850–3900)
Absolute Monocytes: 357 {cells}/uL (ref 200–950)
Basophils Absolute: 53 {cells}/uL (ref 0–200)
Basophils Relative: 0.7 %
Eosinophils Absolute: 281 {cells}/uL (ref 15–500)
Eosinophils Relative: 3.7 %
HCT: 38.4 % (ref 35.9–46.0)
Hemoglobin: 12.4 g/dL (ref 11.7–15.5)
MCH: 25.9 pg — ABNORMAL LOW (ref 27.0–33.0)
MCHC: 32.3 g/dL (ref 31.6–35.4)
MCV: 80.3 fL — ABNORMAL LOW (ref 81.4–101.7)
MPV: 10.2 fL (ref 7.5–12.5)
Monocytes Relative: 4.7 %
Neutro Abs: 4560 {cells}/uL (ref 1500–7800)
Neutrophils Relative %: 60 %
Platelets: 333 Thousand/uL (ref 140–400)
RBC: 4.78 Million/uL (ref 3.80–5.10)
RDW: 13.7 % (ref 11.0–15.0)
Total Lymphocyte: 30.9 %
WBC: 7.6 Thousand/uL (ref 3.8–10.8)

## 2024-03-11 LAB — BASIC METABOLIC PANEL WITH GFR
BUN: 12 mg/dL (ref 7–25)
CO2: 28 mmol/L (ref 20–32)
Calcium: 9.4 mg/dL (ref 8.6–10.2)
Chloride: 104 mmol/L (ref 98–110)
Creat: 0.78 mg/dL (ref 0.50–0.97)
Glucose, Bld: 141 mg/dL — ABNORMAL HIGH (ref 65–99)
Potassium: 4.4 mmol/L (ref 3.5–5.3)
Sodium: 140 mmol/L (ref 135–146)
eGFR: 102 mL/min/1.73m2 (ref >=60)

## 2024-03-11 LAB — SEDIMENTATION RATE: Sed Rate: 34 mm/h — ABNORMAL HIGH (ref 0–20)

## 2024-04-04 ENCOUNTER — Other Ambulatory Visit: Payer: Self-pay

## 2024-04-04 ENCOUNTER — Encounter (HOSPITAL_BASED_OUTPATIENT_CLINIC_OR_DEPARTMENT_OTHER): Payer: Self-pay

## 2024-04-04 ENCOUNTER — Emergency Department (HOSPITAL_BASED_OUTPATIENT_CLINIC_OR_DEPARTMENT_OTHER)
Admission: EM | Admit: 2024-04-04 | Discharge: 2024-04-04 | Disposition: A | Discharge status: Home / Self Care | Attending: Emergency Medicine | Admitting: Emergency Medicine

## 2024-04-04 DIAGNOSIS — M25552 Pain in left hip: Secondary | ICD-10-CM | POA: Diagnosis present

## 2024-04-04 DIAGNOSIS — G8929 Other chronic pain: Secondary | ICD-10-CM | POA: Diagnosis not present

## 2024-04-04 DIAGNOSIS — M7918 Myalgia, other site: Secondary | ICD-10-CM | POA: Insufficient documentation

## 2024-04-04 DIAGNOSIS — M549 Dorsalgia, unspecified: Secondary | ICD-10-CM | POA: Insufficient documentation

## 2024-04-04 MED ORDER — LIDOCAINE-EPINEPHRINE (PF) 2 %-1:200000 IJ SOLN
10.0000 mL | Freq: Once | INTRAMUSCULAR | Status: AC
  Administered 2024-04-04: 10 mL
  Filled 2024-04-04: qty 20

## 2024-04-04 NOTE — ED Triage Notes (Signed)
 Pt has chronic pain and has pain on lower left back and hip  pain x 2 days.

## 2024-04-04 NOTE — Discharge Instructions (Signed)
 You were seen for your pain in the emergency department.  You had a lidocaine  injection that improved it.  At home, please continue your pain medications.    Check your MyChart online for the results of any tests that had not resulted by the time you left the emergency department.   Follow-up with your primary doctor in 2-3 days regarding your visit.  Follow-up with your pain doctor soon as possible  Return immediately to the emergency department if you experience any of the following: Worsening pain, fevers, or any other concerning symptoms.    Thank you for visiting our Emergency Department. It was a pleasure taking care of you today.

## 2024-04-04 NOTE — ED Provider Notes (Signed)
 " [place] [facility] Provider Note   CSN: [id number] Arrival date & time: 04/04/24  1654     Patient presents with: Back Pain   [name] [name] is a 33 y.o. female.   33 year old female history of fibromyalgia, chronic pain, polyarthralgia, undifferentiated tissue disease, and gastric sleeve who presents to the emergency department for left hip pain.  Patient reports that she started having atraumatic left hip pain 2 days ago.  Radiates across her buttocks.  Is tender to palpation.  No radiation down her legs.  No bowel or bladder incontinence.  Has been trying her tizanidine and Lyrica without relief.  Says that she does get what sounds like trigger point injections on the other side which have been successful in the past.         Prior to Admission medications  Medication Sig Start Date End Date Taking? Authorizing Provider  albuterol  (VENTOLIN  HFA) 108 (90 Base) MCG/ACT inhaler Inhale into the lungs. 12/28/20   [provider]  amphetamine-dextroamphetamine (ADDERALL) 20 MG tablet Take by mouth. 03/07/22   [provider]  amphetamine-dextroamphetamine (ADDERALL) 30 MG tablet Take 1 tablet by mouth 2 (two) times daily.    [provider]  buPROPion (WELLBUTRIN XL) 300 MG 24 hr tablet TAKE 1 TABLET(300 MG) BY MOUTH DAILY 02/14/22   [provider]  CALCIUM  PO Take by mouth 2 (two) times daily. Patient not taking: Reported on 03/10/2024    [provider]  cariprazine (VRAYLAR) 1.5 MG capsule Take 1.5 mg by mouth. 02/13/24   [provider]  dicyclomine (BENTYL) 10 MG capsule Take by mouth. Patient not taking: Reported on 03/10/2024 12/21/21   [provider]  EMGALITY 120 MG/ML SOAJ Inject 1 mL into the skin every 30 (thirty) days. 04/29/23   [provider]  estradiol (ESTRACE) 0.01 % CREA vaginal cream Place 1 g vaginally. 06/27/23 06/26/24  [provider]  FLUoxetine  (PROZAC) 20 MG capsule Take 20 mg by mouth. 12/24/23   [provider]  FLUoxetine (PROZAC) 40 MG capsule Take 40 mg by mouth daily. 08/31/21   [provider]  fluticasone  (FLONASE ) 50 MCG/ACT nasal spray as needed. 02/08/17   [provider]  hydroxychloroquine  (PLAQUENIL ) 200 MG tablet TAKE 2 TABLETS(400 MG) BY MOUTH DAILY 03/10/24   Rice, Lonni ORN, MD  JORNAY PM 60 MG CP24 Take 1 capsule by mouth at bedtime. Patient not taking: Reported on 03/10/2024 05/02/23   [provider]  LORazepam  (ATIVAN ) 1 MG tablet Take by mouth. Patient not taking: Reported on 03/10/2024 11/05/20   [provider]  LORazepam  (ATIVAN ) 1 MG tablet Take by mouth. Patient taking differently: Take 1 mg by mouth 2 (two) times daily. 06/06/20   [provider]  Misc. Devices MISC Quad cane with small base for walking  DX: limited mobility, connect tissue disease, fibromyalgia   Fax to [facility] 11/13/22   [provider]  Multiple Vitamin (MULTIVITAMIN) tablet Take 1 tablet by mouth daily.    [provider]  Multiple Vitamins-Minerals (HAIR SKIN AND NAILS FORMULA PO) Take by mouth. Patient not taking: Reported on 03/10/2024    [provider]  NURTEC 75 MG TBDP Take by mouth.    [provider]  ondansetron  (ZOFRAN -ODT) 8 MG disintegrating tablet Take by mouth. Patient not taking: Reported on 03/10/2024 07/19/22   [provider]  pantoprazole  (PROTONIX ) 40 MG tablet Take 40 mg by mouth 2 (two)  times daily. 09/02/21   [provider]  pregabalin (LYRICA) 100 MG capsule Take by mouth. 05/01/23   [provider]  propranolol ER (INDERAL LA) 60 MG 24 hr capsule Take 60 mg by mouth daily. 03/18/20   [provider]  rosuvastatin (CRESTOR) 10 MG tablet Take 10 mg by mouth at bedtime. Patient not taking: Reported on 03/10/2024 03/16/20   [provider]  tiZANidine (ZANAFLEX) 4 MG  tablet Take 4 mg by mouth at bedtime. 03/18/20   [provider]  valACYclovir (VALTREX) 1000 MG tablet Take 1,000 mg by mouth daily. 03/18/20   [provider]  valACYclovir (VALTREX) 500 MG tablet Take by mouth. 05/19/22   [provider]  Vitamin D , Ergocalciferol , (DRISDOL) 1.25 MG (50000 UNIT) CAPS capsule Take 50,000 Units by mouth once a week. Patient not taking: Reported on 03/10/2024 08/15/21   [provider]    Allergies: Amoxicillin-pot clavulanate, Baclofen, Cinnamon, Nickel, Other, Shellfish allergy, Duloxetine hcl, Gabapentin, Hydroxyzine , Metformin and related, Prednisone , Zoloft [sertraline], Phenergan  [promethazine  hcl], and Promethazine     Review of Systems  Updated Vital Signs BP 118/80   Pulse 68   Temp 98.1 F (36.7 C) (Oral)   Resp 17   Ht 5' 2 (1.575 m)   Wt 108.9 kg   LMP 01/04/2014   SpO2 98%   BMI 43.90 kg/m   Physical Exam Musculoskeletal:     Comments: Full range of motion of the left hip.  No effusion appreciated.  Does have tenderness to palpation along the gluteus with a knot that was able to be palpated and reproduces the pain.  No deformities of the left lower extremity.  No tenderness palpation of the thoracic or lumbar spine     (all labs ordered are listed, but only abnormal results are displayed) Labs Reviewed - No data to display  EKG: None  Radiology: No results found.   Injection tendon or ligament  Date/Time: 04/04/2024 9:56 PM  Performed by: Yolande Lamar BROCKS, MD Authorized by: Yolande Lamar BROCKS, MD  Consent: Verbal consent obtained Risks and benefits: risks, benefits and alternatives were discussed Consent given by: patient Local anesthesia used: yes Anesthesia: local infiltration  Anesthesia: Local anesthesia used: yes Local Anesthetic: lidocaine  2% with epinephrine  Anesthetic total: 2 mL  Sedation: Patient sedated: no  Patient tolerance: patient tolerated the procedure well with  no immediate complications Comments: Trigger point injection in left gluteus.  Area was sterilized and 2 mL of lidocaine  with epinephrine  was injected into the trigger point.      Medications Ordered in the ED  lidocaine -EPINEPHrine  (XYLOCAINE  W/EPI) 2 %-1:200000 (PF) injection 10 mL (10 mLs Other Given 04/04/24 2003)                                    Medical Decision Making Risk Prescription drug management.   KEMARIA DEDIC is a 33 year old female history of fibromyalgia, chronic pain, polyarthralgia, undifferentiated tissue disease, and gastric sleeve who presents to the emergency department for left hip pain.   Initial Ddx:  Trigger point, muscle spasm, sciatica, arthritis, fibromyalgia  MDM/Course:  Patient presents to the emergency department with left hip pain.  No injuries.  Will be quite young for her to have arthritis.  Does have an extensive history of chronic pain and fibromyalgia.  On exam does have point tenderness in this area.  Suspect that she likely has a trigger point.  She underwent a trigger point injection and upon re-evaluation was feeling much improved.  Will do her follow-up with her pain management and orthopedist as an outpatient  This patient presents to the ED for concern of complaints listed in HPI, this involves an extensive number of treatment options, and is a complaint that carries with it a high risk of complications and morbidity. Disposition including potential need for admission considered.   Dispo: DC Home. Return precautions discussed including, but not limited to, those listed in the AVS. Allowed pt time to ask questions which were answered fully prior to dc.  I have reviewed the patients home medications and made adjustments as needed Records reviewed Outpatient Clinic Notes   Portions of this note were generated with Dragon dictation software. Dictation errors may occur despite best attempts at proofreading.     Final diagnoses:   Trigger point with back pain    ED Discharge Orders     None          Yolande Lamar BROCKS, MD 04/04/24 2158  "

## 2024-09-09 ENCOUNTER — Ambulatory Visit: Admitting: Internal Medicine
